# Patient Record
Sex: Male | Born: 1945 | Race: White | Hispanic: No | Marital: Married | Smoking: Never smoker
Health system: Southern US, Community
[De-identification: ages and names within clinical notes are randomized; demographics above are authoritative.]

## PROBLEM LIST (undated history)

## (undated) DIAGNOSIS — N472 Paraphimosis: Secondary | ICD-10-CM

## (undated) DIAGNOSIS — D649 Anemia, unspecified: Secondary | ICD-10-CM

## (undated) DIAGNOSIS — Z923 Personal history of irradiation: Secondary | ICD-10-CM

## (undated) DIAGNOSIS — D696 Thrombocytopenia, unspecified: Secondary | ICD-10-CM

## (undated) DIAGNOSIS — F411 Generalized anxiety disorder: Secondary | ICD-10-CM

## (undated) DIAGNOSIS — F419 Anxiety disorder, unspecified: Secondary | ICD-10-CM

## (undated) DIAGNOSIS — N400 Enlarged prostate without lower urinary tract symptoms: Secondary | ICD-10-CM

## (undated) DIAGNOSIS — B029 Zoster without complications: Secondary | ICD-10-CM

## (undated) DIAGNOSIS — C61 Malignant neoplasm of prostate: Secondary | ICD-10-CM

## (undated) DIAGNOSIS — I10 Essential (primary) hypertension: Secondary | ICD-10-CM

## (undated) DIAGNOSIS — Z8719 Personal history of other diseases of the digestive system: Secondary | ICD-10-CM

## (undated) HISTORY — DX: Benign prostatic hyperplasia without lower urinary tract symptoms: N40.0

## (undated) HISTORY — DX: Malignant neoplasm of prostate: C61

## (undated) HISTORY — DX: Essential (primary) hypertension: I10

## (undated) HISTORY — DX: Anemia, unspecified: D64.9

## (undated) HISTORY — DX: Generalized anxiety disorder: F41.1

## (undated) HISTORY — DX: Personal history of other diseases of the digestive system: Z87.19

## (undated) HISTORY — DX: Zoster without complications: B02.9

---

## 1966-04-22 HISTORY — PX: OTHER SURGICAL HISTORY: SHX169

## 1976-04-22 HISTORY — PX: VASECTOMY: SHX75

## 1999-04-04 ENCOUNTER — Ambulatory Visit (HOSPITAL_COMMUNITY): Admission: RE | Admit: 1999-04-04 | Discharge: 1999-04-04 | Payer: Self-pay | Admitting: *Deleted

## 2008-04-22 DIAGNOSIS — Z8719 Personal history of other diseases of the digestive system: Secondary | ICD-10-CM

## 2008-04-22 HISTORY — DX: Personal history of other diseases of the digestive system: Z87.19

## 2009-01-28 ENCOUNTER — Encounter: Payer: Self-pay | Admitting: Internal Medicine

## 2009-01-28 ENCOUNTER — Inpatient Hospital Stay (HOSPITAL_COMMUNITY): Admission: EM | Admit: 2009-01-28 | Discharge: 2009-01-31 | Payer: Self-pay | Admitting: Emergency Medicine

## 2009-01-28 LAB — CONVERTED CEMR LAB
ALT: 15 units/L
AST: 42 units/L
Alkaline Phosphatase: 1880 units/L
Glucose, Bld: 115 mg/dL
Hgb A1c MFr Bld: 6 %
Platelets: 101 10*3/uL
Sodium: 135 meq/L
WBC: 10.3 10*3/uL

## 2009-02-01 ENCOUNTER — Encounter: Payer: Self-pay | Admitting: Internal Medicine

## 2009-02-02 ENCOUNTER — Encounter: Payer: Self-pay | Admitting: Internal Medicine

## 2009-02-03 ENCOUNTER — Encounter: Payer: Self-pay | Admitting: Internal Medicine

## 2009-02-22 ENCOUNTER — Encounter: Payer: Self-pay | Admitting: Internal Medicine

## 2009-02-22 DIAGNOSIS — D649 Anemia, unspecified: Secondary | ICD-10-CM | POA: Insufficient documentation

## 2009-02-23 ENCOUNTER — Ambulatory Visit: Payer: Self-pay | Admitting: Internal Medicine

## 2009-02-23 DIAGNOSIS — C61 Malignant neoplasm of prostate: Secondary | ICD-10-CM | POA: Insufficient documentation

## 2009-02-23 DIAGNOSIS — Z87448 Personal history of other diseases of urinary system: Secondary | ICD-10-CM | POA: Insufficient documentation

## 2009-02-23 DIAGNOSIS — Z9189 Other specified personal risk factors, not elsewhere classified: Secondary | ICD-10-CM | POA: Insufficient documentation

## 2009-02-23 DIAGNOSIS — Z8719 Personal history of other diseases of the digestive system: Secondary | ICD-10-CM | POA: Insufficient documentation

## 2009-02-23 DIAGNOSIS — M5380 Other specified dorsopathies, site unspecified: Secondary | ICD-10-CM | POA: Insufficient documentation

## 2009-02-23 DIAGNOSIS — L89309 Pressure ulcer of unspecified buttock, unspecified stage: Secondary | ICD-10-CM | POA: Insufficient documentation

## 2009-02-24 DIAGNOSIS — R627 Adult failure to thrive: Secondary | ICD-10-CM

## 2009-03-03 ENCOUNTER — Telehealth: Payer: Self-pay | Admitting: Internal Medicine

## 2009-03-10 ENCOUNTER — Encounter: Payer: Self-pay | Admitting: Internal Medicine

## 2009-03-28 ENCOUNTER — Encounter: Payer: Self-pay | Admitting: Internal Medicine

## 2009-04-11 ENCOUNTER — Encounter: Payer: Self-pay | Admitting: Internal Medicine

## 2009-04-13 ENCOUNTER — Telehealth: Payer: Self-pay | Admitting: Internal Medicine

## 2009-04-25 ENCOUNTER — Ambulatory Visit: Payer: Self-pay | Admitting: Internal Medicine

## 2009-04-25 DIAGNOSIS — I10 Essential (primary) hypertension: Secondary | ICD-10-CM | POA: Insufficient documentation

## 2009-04-25 DIAGNOSIS — F411 Generalized anxiety disorder: Secondary | ICD-10-CM | POA: Insufficient documentation

## 2009-05-05 ENCOUNTER — Telehealth: Payer: Self-pay | Admitting: Internal Medicine

## 2009-05-16 ENCOUNTER — Encounter: Payer: Self-pay | Admitting: Internal Medicine

## 2009-06-15 ENCOUNTER — Encounter: Payer: Self-pay | Admitting: Internal Medicine

## 2009-06-20 DIAGNOSIS — B029 Zoster without complications: Secondary | ICD-10-CM

## 2009-06-20 HISTORY — DX: Zoster without complications: B02.9

## 2009-07-25 ENCOUNTER — Ambulatory Visit: Payer: Self-pay | Admitting: Internal Medicine

## 2009-07-25 DIAGNOSIS — B029 Zoster without complications: Secondary | ICD-10-CM | POA: Insufficient documentation

## 2009-07-26 ENCOUNTER — Ambulatory Visit: Payer: Self-pay | Admitting: Internal Medicine

## 2009-08-15 ENCOUNTER — Encounter: Payer: Self-pay | Admitting: Internal Medicine

## 2009-10-18 ENCOUNTER — Encounter: Payer: Self-pay | Admitting: Internal Medicine

## 2009-10-25 ENCOUNTER — Ambulatory Visit: Payer: Self-pay | Admitting: Internal Medicine

## 2009-11-28 ENCOUNTER — Ambulatory Visit: Payer: Self-pay | Admitting: Internal Medicine

## 2009-11-28 LAB — CONVERTED CEMR LAB
Bilirubin Urine: NEGATIVE
Blood in Urine, dipstick: NEGATIVE
Glucose, Urine, Semiquant: NEGATIVE
Ketones, urine, test strip: NEGATIVE
Specific Gravity, Urine: <1.005
Urobilinogen, UA: 0.2
WBC Urine, dipstick: NEGATIVE
pH: 5

## 2010-01-31 ENCOUNTER — Ambulatory Visit: Payer: Self-pay | Admitting: Internal Medicine

## 2010-01-31 LAB — CONVERTED CEMR LAB
BUN: 16 mg/dL (ref 6–23)
Chloride: 105 meq/L (ref 96–112)
Cholesterol: 183 mg/dL (ref 0–200)
HDL: 38.9 mg/dL — ABNORMAL LOW (ref >39.00)
LDL Cholesterol: 112 mg/dL — ABNORMAL HIGH (ref 0–99)
Sodium: 141 meq/L (ref 135–145)
Total CHOL/HDL Ratio: 5
Triglycerides: 163 mg/dL — ABNORMAL HIGH (ref 0.0–149.0)

## 2010-02-21 ENCOUNTER — Encounter: Payer: Self-pay | Admitting: Internal Medicine

## 2010-04-25 ENCOUNTER — Encounter: Payer: Self-pay | Admitting: Internal Medicine

## 2010-05-22 NOTE — Assessment & Plan Note (Signed)
Summary: LOW BLOOD PRESSURE/CD   Vital Signs:  Patient profile:   65 year old male Height:      65 inches (165.10 cm) Weight:      173.8 pounds (79 kg) BMI:     29.03 O2 Sat:      94 % on Room air Temp:     97.6 degrees F (36.44 degrees C) oral Pulse rate:   70 / minute BP sitting:   128 / 88  (left arm) Cuff size:   regular  Vitals Entered By: Orlan Leavens RMA (November 28, 2009 10:05 AM)  O2 Flow:  Room air CC: elevated Bp Is Patient Diabetic? No Pain Assessment Patient in pain? no      Comments Also pt complaining of UTI sxs   Primary Care Provider:  Newt Lukes MD  CC:  elevated Bp.  History of Present Illness: here for 3 month followup  1) HTN - home BP reviewed: range SBP 120-130s now on amlodipine 10mg  once daily with atenolol- BP readings exac by anxiety and pain - no adv SE related to med tx  - no edema no CP or HA  2) met prostate cancer - reports PSA undetctable - on hormone tx - pain mgmt by uro - walking with walker -  3) anxiety - uses as needed valium, no inc in dose or freq reports compliance with ongoing medical treatment and no changes in medication dose or frequency. denies adverse side effects related to current therapy.   4) concerned about ?UTI -  c/o urinary freq, sm vol voids no dysuria or hematuria symptoms improved in last 5 days with change in flomax dosing   Clinical Review Panels:  Immunizations   Last Tetanus Booster:  Tdap (02/23/2009)   Last Flu Vaccine:  Fluvax 3+ (04/25/2009)   Last Pneumovax:  Pneumovax (04/25/2009)   Last Zoster Vaccine:  Zostavax (07/26/2009)  Diabetes Management   HgBA1C:  6.0 (01/28/2009)   Creatinine:  0.65 (01/28/2009)   Last Flu Vaccine:  Fluvax 3+ (04/25/2009)   Last Pneumovax:  Pneumovax (04/25/2009)  CBC   WBC:  10.3 (01/28/2009)   Hgb:  9.8 (01/28/2009)   Platelets:  101 (01/28/2009)   MCV  86.6 (01/28/2009)  Complete Metabolic Panel   Glucose:  115 (01/28/2009)   Sodium:   135 (01/28/2009)   Potassium:  4.0 (01/28/2009)   Chloride:  102 (01/28/2009)   CO2:  27 (01/28/2009)   BUN:  21 (01/28/2009)   Creatinine:  0.65 (01/28/2009)   Albumin:  2.9 (01/28/2009)   Calcium:  8.8 (01/28/2009)   Alk Phos:  1880 (01/28/2009)   SGPT (ALT):  15 (01/28/2009)   SGOT (AST):  42 (01/28/2009)   Current Medications (verified): 1)  Flomax 0.4 Mg Caps (Tamsulosin Hcl) .... Take 1 By Mouth Every Other Day 2)  Colace 100 Mg Caps (Docusate Sodium) .... Take 1 By Mouth Once Daily 3)  Diazepam 5 Mg Tabs (Diazepam) .... 1/2-1 Tab By Mouth Every 8 Hours As Needed For Anxiety/muscle Spasm 4)  Promethazine Hcl 12.5 Mg Tabs (Promethazine Hcl) .Marland Kitchen.. 1 Q 6 Hrs As Needed For Nausea 5)  Amlodipine Besylate 10 Mg Tabs (Amlodipine Besylate) .Marland Kitchen.. 1 By Mouth Once Daily 6)  Oxycontin 20 Mg Xr12h-Tab (Oxycodone Hcl) .Marland Kitchen.. 1 By Mouth Two Times A Day - Per Uro 7)  Oxycodone Hcl 10 Mg Tabs (Oxycodone Hcl) .... Use As Needed For Break-Through Pain 8)  Casodex 50 Mg Tabs (Bicalutamide) .... Takw 1 By Mouth Once  Daily 9)  Atenolol 50 Mg Tabs (Atenolol) .Marland Kitchen.. 1 By Mouth Once Daily 10)  Triamcinolone Acetonide 0.1 % Crea (Triamcinolone Acetonide) .... Apply To Itch Three Times A Day As Needed  Allergies (verified): No Known Drug Allergies  Past History:  Past Medical History: metastatic prostate cancer, involving T-L spine Anemia-NOS  Diverticulitis, hx of shingles - 06/2009 BPH   MD roster:   Darlen Round - Kimbrough  Review of Systems  The patient denies vision loss, chest pain, syncope, headaches, and abdominal pain.    Physical Exam  General:  debilitated appearing man, holding onto RW even while sitting in chair - wife at side Lungs:  normal respiratory effort, no intercostal retractions or use of accessory muscles; normal breath sounds bilaterally - no crackles and no wheezes.    Heart:  normal rate, regular rhythm, no murmur, and no rub. BLE without edema. Psych:  Oriented X3, memory  intact for recent and remote, normally interactive, good eye contact, not anxious appearing, not depressed appearing, and not agitated.      Impression & Recommendations:  Problem # 1:  HYPERTENSION (ICD-401.9)  His updated medication list for this problem includes:    Amlodipine Besylate 10 Mg Tabs (Amlodipine besylate) .Marland Kitchen... 1 by mouth once daily    Atenolol 50 Mg Tabs (Atenolol) .Marland Kitchen... 1 by mouth once daily  improved - cont same check labs next OV  BP today: 128/88 Prior BP: 130/84 (10/25/2009)  Labs Reviewed: K+: 4.0 (01/28/2009) Creat: : 0.65 (01/28/2009)     Problem # 2:  ANXIETY (ICD-300.00)  His updated medication list for this problem includes:    Diazepam 5 Mg Tabs (Diazepam) .Marland Kitchen... 1/2-1 tab by mouth every 8 hours as needed for anxiety/muscle spasm  exac by dx, pain and fear of falling (weak due to mets in spine) cont current tx as ongoing  Problem # 3:  ADENOCARCINOMA, PROSTATE, METASTATIC (ICD-185)  last note from uro reviewed -  reports dec PSA follows with uro Kimbrough for treatment - Lupron every 4 months ongoing s/p XRT to help with pain - narcs as per uro on Valium for dual effect on anxiety and muscle relax pain handicap tag application signed by me 04/2009 due to disabilty/weakness and need for RW  Time spent with patient/wife 25 minutes, more than 50% of this time was spent counseling patient on medications and overall improvement in health  Problem # 4:  UTI'S, HX OF (ICD-V13.00) Udip neg - reassurance provided - cont flomax dosing as ongoing Orders: UA Dipstick w/o Micro (manual) (52841)  Complete Medication List: 1)  Flomax 0.4 Mg Caps (Tamsulosin hcl) .... Take 1 by mouth every other day 2)  Colace 100 Mg Caps (Docusate sodium) .... Take 1 by mouth once daily 3)  Diazepam 5 Mg Tabs (Diazepam) .... 1/2-1 tab by mouth every 8 hours as needed for anxiety/muscle spasm 4)  Promethazine Hcl 12.5 Mg Tabs (Promethazine hcl) .Marland Kitchen.. 1 q 6 hrs as needed for  nausea 5)  Amlodipine Besylate 10 Mg Tabs (Amlodipine besylate) .Marland Kitchen.. 1 by mouth once daily 6)  Oxycontin 20 Mg Xr12h-tab (Oxycodone hcl) .Marland Kitchen.. 1 by mouth two times a day - per uro 7)  Oxycodone Hcl 10 Mg Tabs (Oxycodone hcl) .... Use as needed for break-through pain 8)  Casodex 50 Mg Tabs (Bicalutamide) .... Takw 1 by mouth once daily 9)  Atenolol 50 Mg Tabs (Atenolol) .Marland Kitchen.. 1 by mouth once daily 10)  Triamcinolone Acetonide 0.1 % Crea (Triamcinolone acetonide) .... Apply to itch  three times a day as needed  Patient Instructions: 1)  it was good to see you today. 2)  meds reviewed and no medications changes recommended today- 3)  Please keep follow-up appointment in  October for fasting blood work (cholesterol, lytes/Cr) and to monitor blood pressure and medications, call sooner if problems.  4)  Continue to check your Blood Pressure regularly. If it is above: 140/90 you should make an appointment.  Laboratory Results   Urine Tests    Routine Urinalysis   Color: yellow Appearance: Clear Glucose: negative   (Normal Range: Negative) Bilirubin: negative   (Normal Range: Negative) Ketone: negative   (Normal Range: Negative) Spec. Gravity: <1.005   (Normal Range: 1.003-1.035) Blood: negative   (Normal Range: Negative) pH: 5.0   (Normal Range: 5.0-8.0) Protein: negative   (Normal Range: Negative) Urobilinogen: 0.2   (Normal Range: 0-1) Nitrite: negative   (Normal Range: Negative) Leukocyte Esterace: negative   (Normal Range: Negative)

## 2010-05-22 NOTE — Letter (Signed)
Summary: [facility]  [facility]   Imported By: Lester Hazel Crest 08/21/2009 10:57:08  _____________________________________________________________________  External Attachment:    Type:   Image     Comment:   External Document

## 2010-05-22 NOTE — Letter (Signed)
Summary: [facility]  [facility]   Imported By: Sherian Rein 05/22/2009 07:42:34  _____________________________________________________________________  External Attachment:    Type:   Image     Comment:   External Document

## 2010-05-22 NOTE — Assessment & Plan Note (Signed)
Summary: 2-3 MO ROV /NWS  #   Vital Signs:  Patient profile:   65 year old male Height:      65 inches (165.10 cm) Weight:      142.4 pounds (64.73 kg) O2 Sat:      96 % on Room air Temp:     98.8 degrees F (37.11 degrees C) oral Pulse rate:   98 / minute BP sitting:   152 / 101  (left arm) Cuff size:   regular  Vitals Entered By: Orlan Leavens (April 25, 2009 8:33 AM)  O2 Flow:  Room air CC: 2 month f/u Is Patient Diabetic? No Pain Assessment Patient in pain? no        Primary Care Provider:  Newt Lukes MD  CC:  2 month f/u.  History of Present Illness: new HTN has checked home BP: range SBP 160-190 called here, [facility]- (see phone note, reviewed) started on amlodipine 5mg  once daily - BP doing better now exac by anxiety and pain - no adv SE related to med tx  no CP or HA  met prostate cancer - reports PSA going down - on hormone tx - pain mgmt by uro - walking with walker - requests handicap tag application  anxiety - uses as needed valium, no inc in dose or freq reports compliance with ongoing medical treatment and no changes in medication dose or frequency. denies adverse side effects related to current therapy.   Current Medications (verified): 1)  Flomax 0.4 Mg Caps (Tamsulosin Hcl) .... Take 1 By Mouth Qd 2)  Colace 100 Mg Caps (Docusate Sodium) .... Take 1 By Mouth Qd 3)  Diazepam 5 Mg Tabs (Diazepam) .... 1/2-1 Tab By Mouth Every 8 Hours As Needed For Anxiety/muscle Spasm 4)  Promethazine Hcl 12.5 Mg Tabs (Promethazine Hcl) .Marland Kitchen.. 1 Q 6 Hrs As Needed For Nausea 5)  Amlodipine Besylate 5 Mg Tabs (Amlodipine Besylate) .... Take 1 Po Qd  Allergies (verified): No Known Drug Allergies  Past History:  Past Medical History: Last updated: 02/23/2009 metastatic prostate cancer, involving T-L spine - kimbrough Anemia-NOS Diverticulitis, hx of  Review of Systems  The patient denies fever, chest pain, syncope,  peripheral edema, and headaches.    Physical Exam  General:  debilitated appearing man, holding onto RW even while sitting in chair - wife at side Lungs:  normal respiratory effort, no intercostal retractions or use of accessory muscles; normal breath sounds bilaterally - no crackles and no wheezes.    Heart:  normal rate, regular rhythm, no murmur, and no rub. BLE without edema.   Impression & Recommendations:  Problem # 1:  HYPERTENSION (ICD-401.9) likely exac by diet (inc salt per his report), pain and anxiety - improving with new med but suspect will need inc dose - (home BP log reviewed today with pt) pt to call before next refill due if SBP not <140 -- will inc amlodipine to 10mg  once daily  His updated medication list for this problem includes:    Amlodipine Besylate 5 Mg Tabs (Amlodipine besylate) .Marland Kitchen... Take 1 po qd  BP today: 152/101 Prior BP: 122/62 (02/23/2009)  Labs Reviewed: K+: 4.0 (01/28/2009) Creat: : 0.65 (01/28/2009)     Problem # 2:  ADENOCARCINOMA, PROSTATE, METASTATIC (ICD-185) reports dec PSA follows with uro Kimbrough for treatment - ongoing XRT to help with pain - narcs as per uro on Valium for dual effect on anxiety and muscle relax  pain handicap tag application signed by me today due to disabilty/weakness and need for RW  Problem # 3:  ANXIETY (ICD-300.00) exac by dx, pain and fear of falling (weak due to mets in spine) cont current tx as ongoing His updated medication list for this problem includes:    Diazepam 5 Mg Tabs (Diazepam) .Marland Kitchen... 1/2-1 tab by mouth every 8 hours as needed for anxiety/muscle spasm  Complete Medication List: 1)  Flomax 0.4 Mg Caps (Tamsulosin hcl) .... Take 1 by mouth qd 2)  Colace 100 Mg Caps (Docusate sodium) .... Take 1 by mouth qd 3)  Diazepam 5 Mg Tabs (Diazepam) .... 1/2-1 tab by mouth every 8 hours as needed for anxiety/muscle spasm 4)  Promethazine Hcl 12.5 Mg Tabs (Promethazine hcl) .Marland Kitchen.. 1 q 6 hrs as needed for  nausea 5)  Amlodipine Besylate 5 Mg Tabs (Amlodipine besylate) .... Take 1 po qd 6)  Oxycontin 20 Mg Xr12h-tab (Oxycodone hcl) .Marland Kitchen.. 1 by mouth two times a day - per uro  Other Orders: Admin 1st Vaccine (02409) Flu Vaccine 65yrs + (73532) Pneumococcal Vaccine (99242) Admin of Any Addtl Vaccine (68341)  Patient Instructions: 1)  it was good to see you today.  2)  if SBP>140/90 when due for amlodipine refill, call and we will send in new rx for 10mg  dose once daily   - otherwise, if well controlled BP, we will continue current 5mg  dose when due for refill 3)  you have had your flu and pneumonia vacinations today 4)  handicap form application signed today 5)  Please schedule a follow-up appointment in 3 months, sooner if problems.  Flu Vaccine Consent Questions     Do you have a history of severe allergic reactions to this vaccine? no    Any prior history of allergic reactions to egg and/or gelatin? no    Do you have a sensitivity to the preservative Thimersol? no    Do you have a past history of Guillan-Barre Syndrome? no    Do you currently have an acute febrile illness? no    Have you ever had a severe reaction to latex? no    Vaccine information given and explained to patient? yes    Are you currently pregnant? no    Lot Number:[id number]   Exp Date:10/19/2009   Site Given  Right Deltoid IM 4)  handicap form application signed today 5)  Please schedule a follow-up appointment in 3 months, sooner if problems.    Immunizations Administered:  Pneumonia Vaccine:    Vaccine Type: Pneumovax    Site: left deltoid    Mfr: Merck    Dose: 0.5 ml    Route: IM    Given by: Orlan Leavens    Exp. Date: 05/19/2010    Lot #: [id number]    VIS given: 04/25/09  .lbflu

## 2010-05-22 NOTE — Letter (Signed)
Summary: [facility]  [facility]   Imported By: Sherian Rein 06/21/2009 07:38:10  _____________________________________________________________________  External Attachment:    Type:   Image     Comment:   External Document

## 2010-05-22 NOTE — Progress Notes (Signed)
  Phone Note Call from Patient Call back at Home Phone [phone] Call back at or [phone] cell   Caller: Patient Call For: Dr Felicity Coyer Summary of Call: Pt called and states he needs bp medicine called to [facility] [address] [phone] phone) - Amlodipine Besylate 10 mg instead of 5mg  pt states Dr Felicity Coyer told pt to call when he needed a refill and she would send, he has a couple pills left. Please let pt know. Initial call taken by: Verdell Face,  May 05, 2009 10:25 AM  Follow-up for Phone Call        154/102 on right 149/95 on left Last ov states if bp is not under 140 Amlodipine Besylate would be increased to 10. OK to refill? Follow-up by: Josph Macho CMA,  May 05, 2009 10:54 AM  Additional Follow-up for Phone Call Additional follow up Details #1::        yes - i have done so - sent e-rx fpr 10mg  tabs- please let pt know same - thanks Additional Follow-up by: Newt Lukes MD,  May 05, 2009 12:50 PM    Additional Follow-up for Phone Call Additional follow up Details #2::    Informed pt Follow-up by: Josph Macho CMA,  May 05, 2009 2:05 PM  New/Updated Medications: AMLODIPINE BESYLATE 10 MG TABS (AMLODIPINE BESYLATE) 1 by mouth once daily Prescriptions: AMLODIPINE BESYLATE 10 MG TABS (AMLODIPINE BESYLATE) 1 by mouth once daily  #30 x 3   Entered and Authorized by:   Newt Lukes MD   Signed by:   Newt Lukes MD on 05/05/2009   Method used:   [facility]ically to        [facility]  [address] [id number] ([facility])       [address]       [place], [place]  [address]       Ph: [phone] or [phone]       Fax: [phone]   RxID:   [id number]

## 2010-05-22 NOTE — Letter (Signed)
Summary: [facility]  [facility]   Imported By: Lennie Odor 10/25/2009 16:53:25  _____________________________________________________________________  External Attachment:    Type:   Image     Comment:   External Document

## 2010-05-22 NOTE — Assessment & Plan Note (Signed)
Summary: 3 MO ROV /NWS  #   Vital Signs:  Patient profile:   65 year old male Height:      65 inches (165.10 cm) Weight:      177.2 pounds (80.55 kg) O2 Sat:      97 % on Room air Temp:     98.7 degrees F (37.06 degrees C) oral Pulse rate:   71 / minute BP sitting:   130 / 90  (left arm) Cuff size:   regular  Vitals Entered By: Orlan Leavens RMA (January 31, 2010 10:31 AM)  O2 Flow:  Room air CC: 3 month follow-up Is Patient Diabetic? No Pain Assessment Patient in pain? no      Comments want flu shot   Primary Care Provider:  Newt Lukes MD  CC:  3 month follow-up.  History of Present Illness: here for 3 month followup  1) HTN - home BP reviewed: range SBP 120-130s now on amlodipine 10mg  once daily with atenolol- BP readings exac by anxiety and pain - no adv SE related to med tx  - no edema no CP or HA  2) met prostate cancer - reports PSA undetctable - on hormone tx - pain mgmt by uro - controlled walking with walker only as needed (long exertion out of house)  3) anxiety - uses as needed valium, no inc in dose or freq reports compliance with ongoing medical treatment and no changes in medication dose or frequency. denies adverse side effects related to current therapy.    Clinical Review Panels:  CBC   WBC:  10.3 (01/28/2009)   Hgb:  9.8 (01/28/2009)   Platelets:  101 (01/28/2009)   MCV  86.6 (01/28/2009)  Complete Metabolic Panel   Glucose:  115 (01/28/2009)   Sodium:  135 (01/28/2009)   Potassium:  4.0 (01/28/2009)   Chloride:  102 (01/28/2009)   CO2:  27 (01/28/2009)   BUN:  21 (01/28/2009)   Creatinine:  0.65 (01/28/2009)   Albumin:  2.9 (01/28/2009)   Calcium:  8.8 (01/28/2009)   Alk Phos:  1880 (01/28/2009)   SGPT (ALT):  15 (01/28/2009)   SGOT (AST):  42 (01/28/2009)   Current Medications (verified): 1)  Flomax 0.4 Mg Caps (Tamsulosin Hcl) .... Take 1 By Mouth Every Other Day 2)  Colace 100 Mg Caps (Docusate Sodium) .... Take 1 By  Mouth Once Daily 3)  Amlodipine Besylate 10 Mg Tabs (Amlodipine Besylate) .Marland Kitchen.. 1 By Mouth Once Daily 4)  Oxycodone Hcl 10 Mg Tabs (Oxycodone Hcl) .... Use As Needed For Break-Through Pain 5)  Casodex 50 Mg Tabs (Bicalutamide) .... Takw 1 By Mouth Once Daily 6)  Atenolol 50 Mg Tabs (Atenolol) .Marland Kitchen.. 1 By Mouth Once Daily 7)  Oxycodone-Acetaminophen 5-325 Mg Tabs (Oxycodone-Acetaminophen) .... Take 2 Every 4 Hours As Needed For Pain  Allergies (verified): No Known Drug Allergies  Past History:  Past Medical History: metastatic prostate cancer, involving T-L spine Anemia-NOS  Diverticulitis, hx of shingles - 06/2009 BPH     MD roster:   Joya Martyr  Review of Systems  The patient denies fever, weight loss, chest pain, syncope, and abdominal pain.    Physical Exam  General:  debilitated appearing man, holding onto RW even while sitting in chair - wife at side Lungs:  normal respiratory effort, no intercostal retractions or use of accessory muscles; normal breath sounds bilaterally - no crackles and no wheezes.    Heart:  normal rate, regular rhythm, no murmur, and no  rub. BLE without edema.   Impression & Recommendations:  Problem # 1:  HYPERTENSION (ICD-401.9)  His updated medication list for this problem includes:    Amlodipine Besylate 10 Mg Tabs (Amlodipine besylate) .Marland Kitchen... 1 by mouth once daily    Atenolol 50 Mg Tabs (Atenolol) .Marland Kitchen... 1 by mouth once daily  Orders: TLB-Lipid Panel (80061-LIPID) TLB-BMP (Basic Metabolic Panel-BMET) (80048-METABOL)  BP today: 130/90 Prior BP: 128/88 (11/28/2009)  Labs Reviewed: K+: 4.0 (01/28/2009) Creat: : 0.65 (01/28/2009)     Problem # 2:  ADENOCARCINOMA, PROSTATE, METASTATIC (ICD-185)  last note from uro reviewed -  reports dec PSA follows with uro Kimbrough for treatment - Lupron every 4 months ongoing s/p XRT to help with pain - narcs as per uro on Valium for dual effect on anxiety and muscle relax pain handicap tag  application signed by me 04/2009 due to disabilty/weakness and need for RW  Time spent with patient/wife 25 minutes, more than 50% of this time was spent counseling patient on medications and overall improvement in health  Problem # 3:  ANXIETY (ICD-300.00)  The following medications were removed from the medication list:    Diazepam 5 Mg Tabs (Diazepam) .Marland Kitchen... 1/2-1 tab by mouth every 8 hours as needed for anxiety/muscle spasm  exac by dx, pain and fear of falling (weak due to mets in spine) cont current tx as ongoing  Complete Medication List: 1)  Flomax 0.4 Mg Caps (Tamsulosin hcl) .... Take 1 by mouth every other day 2)  Colace 100 Mg Caps (Docusate sodium) .... Take 1 by mouth once daily 3)  Amlodipine Besylate 10 Mg Tabs (Amlodipine besylate) .Marland Kitchen.. 1 by mouth once daily 4)  Oxycodone Hcl 10 Mg Tabs (Oxycodone hcl) .... Use as needed for break-through pain 5)  Casodex 50 Mg Tabs (Bicalutamide) .... Takw 1 by mouth once daily 6)  Atenolol 50 Mg Tabs (Atenolol) .Marland Kitchen.. 1 by mouth once daily 7)  Oxycodone-acetaminophen 5-325 Mg Tabs (Oxycodone-acetaminophen) .... Take 2 every 4 hours as needed for pain  Other Orders: Admin 1st Vaccine (96295) Flu Vaccine 80yrs + (28413) Flu Vaccine Consent Questions     Do you have a history of severe allergic reactions to this vaccine? no    Any prior history of allergic reactions to egg and/or gelatin? no    Do you have a sensitivity to the preservative Thimersol? no    Do you have a past history of Guillan-Barre Syndrome? no    Do you currently have an acute febrile illness? no    Have you ever had a severe reaction to latex? no    Vaccine information given and explained to patient? yes    Are you currently pregnant? no    Lot Number:[id number]   Exp Date:10/20/2010   Site Given  Left Deltoid IM Flu Vaccine 58yrs + (24401)  Patient Instructions: 1)  it was good to see you today. 2)  test(s) ordered today - your results will be posted on the phone  tree for review in 48-72 hours from the time of test completion; call [phone] and enter your 9 digit MRN (listed above on this page, just below your name); if any changes need to be made or there are abnormal results, you will be contacted directly.  3)  Please schedule a follow-up appointment in 6 months, sooner if problems.   Marland Kitchenlbflu

## 2010-05-22 NOTE — Assessment & Plan Note (Signed)
Summary: 3 mos f/u / # / cd   Vital Signs:  Patient profile:   65 year old male Height:      65 inches (165.10 cm) Weight:      173.4 pounds (78.82 kg) O2 Sat:      94 % on Room air Temp:     98.7 degrees F (37.06 degrees C) oral Pulse rate:   69 / minute BP sitting:   130 / 84  (left arm) Cuff size:   regular  Vitals Entered By: Orlan Leavens (October 25, 2009 8:33 AM)  O2 Flow:  Room air CC: 3 month follow-up Is Patient Diabetic? No Pain Assessment Patient in pain? no        Primary Care Provider:  Newt Lukes MD  CC:  3 month follow-up.  History of Present Illness: here for 3 month followup  1) HTN - home BP: range SBP 120-130s now on amlodipine 10mg  once daily with atenolol- BP readings exac by anxiety and pain - no adv SE related to med tx  - no edema no CP or HA  2) met prostate cancer - reports PSA undetctable - on hormone tx - pain mgmt by uro - walking with walker - requests handicap tag application  3) anxiety - uses as needed valium, no inc in dose or freq reports compliance with ongoing medical treatment and no changes in medication dose or frequency. denies adverse side effects related to current therapy.    Clinical Review Panels:  Immunizations   Last Tetanus Booster:  Tdap (02/23/2009)   Last Flu Vaccine:  Fluvax 3+ (04/25/2009)   Last Pneumovax:  Pneumovax (04/25/2009)   Last Zoster Vaccine:  Zostavax (07/26/2009)  CBC   WBC:  10.3 (01/28/2009)   Hgb:  9.8 (01/28/2009)   Platelets:  101 (01/28/2009)   MCV  86.6 (01/28/2009)  Complete Metabolic Panel   Glucose:  115 (01/28/2009)   Sodium:  135 (01/28/2009)   Potassium:  4.0 (01/28/2009)   Chloride:  102 (01/28/2009)   CO2:  27 (01/28/2009)   BUN:  21 (01/28/2009)   Creatinine:  0.65 (01/28/2009)   Albumin:  2.9 (01/28/2009)   Calcium:  8.8 (01/28/2009)   Alk Phos:  1880 (01/28/2009)   SGPT (ALT):  15 (01/28/2009)   SGOT (AST):  42 (01/28/2009)   Current Medications  (verified): 1)  Flomax 0.4 Mg Caps (Tamsulosin Hcl) .... Take 1 By Mouth Every Other Day 2)  Colace 100 Mg Caps (Docusate Sodium) .... Take 1 By Mouth Once Daily 3)  Diazepam 5 Mg Tabs (Diazepam) .... 1/2-1 Tab By Mouth Every 8 Hours As Needed For Anxiety/muscle Spasm 4)  Promethazine Hcl 12.5 Mg Tabs (Promethazine Hcl) .Marland Kitchen.. 1 Q 6 Hrs As Needed For Nausea 5)  Amlodipine Besylate 10 Mg Tabs (Amlodipine Besylate) .Marland Kitchen.. 1 By Mouth Once Daily 6)  Oxycontin 20 Mg Xr12h-Tab (Oxycodone Hcl) .Marland Kitchen.. 1 By Mouth Two Times A Day - Per Uro 7)  Oxycodone Hcl 10 Mg Tabs (Oxycodone Hcl) .... Use As Needed For Break-Through Pain 8)  Casodex 50 Mg Tabs (Bicalutamide) .... Takw 1 By Mouth Once Daily 9)  Atenolol 50 Mg Tabs (Atenolol) .Marland Kitchen.. 1 By Mouth Once Daily 10)  Triamcinolone Acetonide 0.1 % Crea (Triamcinolone Acetonide) .... Apply To Itch Three Times A Day As Needed  Allergies (verified): No Known Drug Allergies  Past History:  Past Medical History: metastatic prostate cancer, involving T-L spine Anemia-NOS Diverticulitis, hx of shingles - 06/2009 BPH   MD  roster: Darlen Round - Kimbrough  Review of Systems  The patient denies anorexia, fever, weight loss, chest pain, and headaches.    Physical Exam  General:  debilitated appearing man, holding onto RW even while sitting in chair - wife at side Lungs:  normal respiratory effort, no intercostal retractions or use of accessory muscles; normal breath sounds bilaterally - no crackles and no wheezes.    Heart:  normal rate, regular rhythm, no murmur, and no rub. BLE without edema. Psych:  Oriented X3, memory intact for recent and remote, normally interactive, good eye contact, not anxious appearing, not depressed appearing, and not agitated.      Impression & Recommendations:  Problem # 1:  HYPERTENSION (ICD-401.9) Assessment Improved improved - cont same check labs next OV 3 mo His updated medication list for this problem includes:    Amlodipine  Besylate 10 Mg Tabs (Amlodipine besylate) .Marland Kitchen... 1 by mouth once daily    Atenolol 50 Mg Tabs (Atenolol) .Marland Kitchen... 1 by mouth once daily  BP today: 130/84 Prior BP: 150/92 (07/25/2009)  Labs Reviewed: K+: 4.0 (01/28/2009) Creat: : 0.65 (01/28/2009)     Problem # 2:  ANXIETY (ICD-300.00)  exac by dx, pain and fear of falling (weak due to mets in spine) cont current tx as ongoing His updated medication list for this problem includes:    Diazepam 5 Mg Tabs (Diazepam) .Marland Kitchen... 1/2-1 tab by mouth every 8 hours as needed for anxiety/muscle spasm  Problem # 3:  ADENOCARCINOMA, PROSTATE, METASTATIC (ICD-185)  last note from uro reviewed -  reports dec PSA follows with uro Kimbrough for treatment - Lupron every 4 months ongoing s/p XRT to help with pain - narcs as per uro on Valium for dual effect on anxiety and muscle relax pain handicap tag application signed by me 04/2009 due to disabilty/weakness and need for RW  Time spent with patient/wife 25 minutes, more than 50% of this time was spent counseling patient on medications and overall improvement in health  Complete Medication List: 1)  Flomax 0.4 Mg Caps (Tamsulosin hcl) .... Take 1 by mouth every other day 2)  Colace 100 Mg Caps (Docusate sodium) .... Take 1 by mouth once daily 3)  Diazepam 5 Mg Tabs (Diazepam) .... 1/2-1 tab by mouth every 8 hours as needed for anxiety/muscle spasm 4)  Promethazine Hcl 12.5 Mg Tabs (Promethazine hcl) .Marland Kitchen.. 1 q 6 hrs as needed for nausea 5)  Amlodipine Besylate 10 Mg Tabs (Amlodipine besylate) .Marland Kitchen.. 1 by mouth once daily 6)  Oxycontin 20 Mg Xr12h-tab (Oxycodone hcl) .Marland Kitchen.. 1 by mouth two times a day - per uro 7)  Oxycodone Hcl 10 Mg Tabs (Oxycodone hcl) .... Use as needed for break-through pain 8)  Casodex 50 Mg Tabs (Bicalutamide) .... Takw 1 by mouth once daily 9)  Atenolol 50 Mg Tabs (Atenolol) .Marland Kitchen.. 1 by mouth once daily 10)  Triamcinolone Acetonide 0.1 % Crea (Triamcinolone acetonide) .... Apply to itch  three times a day as needed  Patient Instructions: 1)  it was good to see you today. 2)  continue atenolol and amlodipne for blood pressure - much improved 3)  Please schedule a follow-up appointment in 3 months to monitor blood pressure and medications, sooner if problems.  4)  Check your Blood Pressure regularly. If it is above: 140/90 you should make an appointment.

## 2010-05-22 NOTE — Assessment & Plan Note (Signed)
Summary: 3 MTH FU   STC   Vital Signs:  Patient profile:   65 year old male Height:      65 inches (165.10 cm) Weight:      160.4 pounds (72.91 kg) O2 Sat:      95 % on Room air Temp:     99.3 degrees F (37.39 degrees C) oral Pulse rate:   91 / minute BP sitting:   150 / 92  (left arm) Cuff size:   regular  Vitals Entered By: Orlan Leavens (July 25, 2009 8:34 AM)  O2 Flow:  Room air CC: 3 MONTH FOLLOW-UP Is Patient Diabetic? No Pain Assessment Patient in pain? no        Primary Care Provider:  Newt Lukes MD  CC:  3 MONTH FOLLOW-UP.  History of Present Illness: here for 3 month followup  1) HTN - home BP: range SBP 150s now on amlodipine 10mg  once daily - BP doing better but not at goal BP readings exac by anxiety and pain - no adv SE related to med tx  - no edema no CP or HA  2) met prostate cancer - reports PSA going down - on hormone tx - pain mgmt by uro - walking with walker - requests handicap tag application  3) anxiety - uses as needed valium, no inc in dose or freq reports compliance with ongoing medical treatment and no changes in medication dose or frequency. denies adverse side effects related to current therapy.   4) episode of shingles affecting LUE 1 months ago - revieved 7 day tx [facility] no residual pain but ?re; poss ib;e vaccine to prevent recurrent episode  Clinical Review Panels:  Prevention   Last Colonoscopy:  Pt states was done in [place] Results : Normal (04/22/2006)  Immunizations   Last Tetanus Booster:  Tdap (02/23/2009)   Last Flu Vaccine:  Fluvax 3+ (04/25/2009)   Last Pneumovax:  Pneumovax (04/25/2009)  CBC   WBC:  10.3 (01/28/2009)   Hgb:  9.8 (01/28/2009)   Platelets:  101 (01/28/2009)   MCV  86.6 (01/28/2009)  Complete Metabolic Panel   Glucose:  115 (01/28/2009)   Sodium:  135 (01/28/2009)   Potassium:  4.0 (01/28/2009)   Chloride:  102 (01/28/2009)   CO2:  27 (01/28/2009)  BUN:  21 (01/28/2009)   Creatinine:  0.65 (01/28/2009)   Albumin:  2.9 (01/28/2009)   Calcium:  8.8 (01/28/2009)   Alk Phos:  1880 (01/28/2009)   SGPT (ALT):  15 (01/28/2009)   SGOT (AST):  42 (01/28/2009)   Current Medications (verified): 1)  Flomax 0.4 Mg Caps (Tamsulosin Hcl) .... Take 1 By Mouth Qd 2)  Colace 100 Mg Caps (Docusate Sodium) .... Take 1 By Mouth Qd 3)  Diazepam 5 Mg Tabs (Diazepam) .... 1/2-1 Tab By Mouth Every 8 Hours As Needed For Anxiety/muscle Spasm 4)  Promethazine Hcl 12.5 Mg Tabs (Promethazine Hcl) .Marland Kitchen.. 1 Q 6 Hrs As Needed For Nausea 5)  Amlodipine Besylate 10 Mg Tabs (Amlodipine Besylate) .Marland Kitchen.. 1 By Mouth Once Daily 6)  Oxycontin 20 Mg Xr12h-Tab (Oxycodone Hcl) .Marland Kitchen.. 1 By Mouth Two Times A Day - Per Uro 7)  Oxycodone Hcl 10 Mg Tabs (Oxycodone Hcl) .... Use As Needed For Break-Through Pain 8)  Casodex 50 Mg Tabs (Bicalutamide) .... Takw 1 By Mouth Qd  Allergies (verified): No Known Drug Allergies  Past History:  Past Medical History: metastatic prostate cancer, involving T-L spine Anemia-NOS Diverticulitis,  hx of shingles - 06/2009  MD rooster: uro - Kimbrough  Review of Systems  The patient denies anorexia, fever, weight loss, abdominal pain, difficulty walking, and depression.    Physical Exam  General:  debilitated appearing man, holding onto RW even while sitting in chair - wife at side Lungs:  normal respiratory effort, no intercostal retractions or use of accessory muscles; normal breath sounds bilaterally - no crackles and no wheezes.    Heart:  normal rate, regular rhythm, no murmur, and no rub. BLE without edema. Skin:  healing/healed shingles lesions along lower LUE - no active lesions or cellulitis Psych:  Oriented X3, memory intact for recent and remote, normally interactive, good eye contact, not anxious appearing, not depressed appearing, and not agitated.      Impression & Recommendations:  Problem # 1:  HYPERTENSION  (ICD-401.9)  still incontolled though improving -  add atenolol to current tx - risk/benefit and poss SE discussed His updated medication list for this problem includes:    Amlodipine Besylate 10 Mg Tabs (Amlodipine besylate) .Marland Kitchen... 1 by mouth once daily    Atenolol 50 Mg Tabs (Atenolol) .Marland Kitchen... 1 by mouth once daily  Orders: Prescription Created Electronically [phone])  BP today: 150/92 Prior BP: 152/101 (04/25/2009)  Labs Reviewed: K+: 4.0 (01/28/2009) Creat: : 0.65 (01/28/2009)     Problem # 2:  ANXIETY (ICD-300.00)  exac by dx, pain and fear of falling (weak due to mets in spine) cont current tx as ongoing His updated medication list for this problem includes:    Diazepam 5 Mg Tabs (Diazepam) .Marland Kitchen... 1/2-1 tab by mouth every 8 hours as needed for anxiety/muscle spasm  Problem # 3:  ADENOCARCINOMA, PROSTATE, METASTATIC (ICD-185) note from uro late 05/2009 reviewed -  cont tx as ongoing  reports dec PSA follows with uro Kimbrough for treatment - ongoing XRT to help with pain - narcs as per uro on Valium for dual effect on anxiety and muscle relax pain handicap tag application signed by me 04/2009 due to disabilty/weakness and need for RW  Problem # 4:  HERPES ZOSTER (ICD-053.9) Assessment: Improved will check on availablity and cost of zostavax -  Complete Medication List: 1)  Flomax 0.4 Mg Caps (Tamsulosin hcl) .... Take 1 by mouth every other day 2)  Colace 100 Mg Caps (Docusate sodium) .... Take 1 by mouth once daily 3)  Diazepam 5 Mg Tabs (Diazepam) .... 1/2-1 tab by mouth every 8 hours as needed for anxiety/muscle spasm 4)  Promethazine Hcl 12.5 Mg Tabs (Promethazine hcl) .Marland Kitchen.. 1 q 6 hrs as needed for nausea 5)  Amlodipine Besylate 10 Mg Tabs (Amlodipine besylate) .Marland Kitchen.. 1 by mouth once daily 6)  Oxycontin 20 Mg Xr12h-tab (Oxycodone hcl) .Marland Kitchen.. 1 by mouth two times a day - per uro 7)  Oxycodone Hcl 10 Mg Tabs (Oxycodone hcl) .... Use as needed for break-through pain 8)   Casodex 50 Mg Tabs (Bicalutamide) .... Takw 1 by mouth once daily 9)  Atenolol 50 Mg Tabs (Atenolol) .Marland Kitchen.. 1 by mouth once daily 10)  Triamcinolone Acetonide 0.1 % Crea (Triamcinolone acetonide) .... Apply to itch three times a day as needed  Patient Instructions: 1)  it was good to see you today. 2)  add atenolol for blood pressure in addition to current medications - 3)  also use triamcinolone cream to itch as needed  4)  your prescriptions have been electronically submitted to your pharmacy. Please take as directed. Contact our office if you believe you're  having problems with the medication(s).  5)  Please schedule a follow-up appointment in 3 months to monitor blood pressure and medications, sooner if problems.  6)  Check your Blood Pressure regularly. If it is above: 140/90 you should make an appointment. Prescriptions: TRIAMCINOLONE ACETONIDE 0.1 % CREA (TRIAMCINOLONE ACETONIDE) apply to itch three times a day as needed  #15g x 1   Entered and Authorized by:   Newt Lukes MD   Signed by:   Newt Lukes MD on 07/25/2009   Method used:   [facility]ically to        [facility]  [address] [id number]       [address]       [place], [place]  [address]       Ph: [phone] or [phone]       Fax: [phone]   RxID:   [id number] ATENOLOL 50 MG TABS (ATENOLOL) 1 by mouth once daily  #30 x 5   Entered and Authorized by:   Newt Lukes MD   Signed by:   Newt Lukes MD on 07/25/2009   Method used:   [facility] to        [facility]  [address] [id number]       [address]       [place], [place]  [address]       Ph: [phone] or [phone]       Fax: [phone]   RxID:   [id number]

## 2010-05-22 NOTE — Letter (Signed)
[facility]: [facility]   Imported By: Sherian Rein 02/28/2010 09:55:51  _____________________________________________________________________  External Attachment:    Type:   Image     Comment:   External Document

## 2010-05-22 NOTE — Assessment & Plan Note (Signed)
Summary: ZOSTAVAX/ VAL Natale Milch  Nurse Visit  CC: pt here for zostavax injection after pt verifing his date of birth injection was administered in his left arm subq, pt tolerated well/ ab   Allergies: No Known Drug Allergies  Immunizations Administered:  Zostavax # 1:    Vaccine Type: Zostavax    Site: left arm    Mfr: Merck    Dose: 0.5 ml    Route: [place]    Given by: Ami Bullins CMA    Exp. Date: 08/27/2010    Lot #: [id number]    VIS given: 02/01/05 given July 26, 2009.  Orders Added: 1)  Zoster (Shingles) Vaccine Live [[id number]] 2)  Admin 1st Vaccine [phone]

## 2010-05-24 NOTE — Letter (Signed)
Summary: [facility]   Imported By: Sherian Rein 05/01/2010 15:14:46  _____________________________________________________________________  External Attachment:    Type:   Image     Comment:   External Document

## 2010-06-04 ENCOUNTER — Telehealth: Payer: Self-pay | Admitting: Internal Medicine

## 2010-06-13 NOTE — Progress Notes (Signed)
Summary: atenolol  Phone Note Refill Request Message from:  Fax from Pharmacy on June 04, 2010 10:01 AM  Refills Requested: Medication #1:  ATENOLOL 50 MG TABS 1 by mouth once daily   Last Ref[facility]: [facility]/05/[facility]    Method Requested: Electronic Initial call taken by: Orlan Leavens RMA,  June 04, 2010 10:01 AM    Prescriptions: ATENOLOL 50 MG TABS (ATENOLOL) 1 by mouth once daily  #30 Tablet x 5   Entered by:   Orlan Leavens RMA   Authorized by:   Newt Lukes MD   Signed by:   Orlan Leavens RMA on 06/04/2010   Method used:   [facility]        [facility]  [address] [id number]       [address]       [place], [place]  [address]       Ph: [phone] or [phone]       Fax: [phone]   RxID:   [id number]

## 2010-07-03 ENCOUNTER — Encounter: Payer: Self-pay | Admitting: Internal Medicine

## 2010-07-10 NOTE — Letter (Signed)
Summary: [facility]   Imported By: Sherian Rein 07/06/2010 12:36:53  _____________________________________________________________________  External Attachment:    Type:   Image     Comment:   External Document

## 2010-07-11 ENCOUNTER — Encounter: Payer: Self-pay | Admitting: *Deleted

## 2010-07-26 LAB — MAGNESIUM: Magnesium: 2.4 mg/dL (ref 1.5–2.5)

## 2010-07-26 LAB — DIFFERENTIAL
Basophils Absolute: 0.1 10*3/uL (ref 0.0–0.1)
Basophils Relative: 0 % (ref 0–1)
Basophils Relative: 0 % (ref 0–1)
Eosinophils Absolute: 0.1 10*3/uL (ref 0.0–0.7)
Eosinophils Absolute: 0.2 10*3/uL (ref 0.0–0.7)
Eosinophils Relative: 1 % (ref 0–5)
Lymphocytes Relative: 29 % (ref 12–46)
Lymphs Abs: 4.2 10*3/uL — ABNORMAL HIGH (ref 0.7–4.0)
Monocytes Absolute: 1.3 10*3/uL — ABNORMAL HIGH (ref 0.1–1.0)
Monocytes Relative: 9 % (ref 3–12)
Neutrophils Relative %: 59 % (ref 43–77)

## 2010-07-26 LAB — COMPREHENSIVE METABOLIC PANEL
ALT: 15 U/L (ref 0–53)
Albumin: 2.9 g/dL — ABNORMAL LOW (ref 3.5–5.2)
Calcium: 8.8 mg/dL (ref 8.4–10.5)
GFR calc Af Amer: >60 mL/min (ref >60)
Glucose, Bld: 111 mg/dL — ABNORMAL HIGH (ref 70–99)
Sodium: 135 mEq/L (ref 135–145)
Total Protein: 5.5 g/dL — ABNORMAL LOW (ref 6.0–8.3)

## 2010-07-26 LAB — CBC
HCT: 33.3 % — ABNORMAL LOW (ref 39.0–52.0)
Hemoglobin: 9.8 g/dL — ABNORMAL LOW (ref 13.0–17.0)
MCHC: 33.4 g/dL (ref 30.0–36.0)
MCHC: 34.1 g/dL (ref 30.0–36.0)
MCV: 86.7 fL (ref 78.0–100.0)
MCV: 86.9 fL (ref 78.0–100.0)
Platelets: 101 10*3/uL — ABNORMAL LOW (ref 150–400)
Platelets: 108 10*3/uL — ABNORMAL LOW (ref 150–400)
RBC: 3.78 MIL/uL — ABNORMAL LOW (ref 4.22–5.81)
RBC: 3.84 MIL/uL — ABNORMAL LOW (ref 4.22–5.81)
RDW: 14.6 % (ref 11.5–15.5)
WBC: 13 10*3/uL — ABNORMAL HIGH (ref 4.0–10.5)

## 2010-07-26 LAB — BASIC METABOLIC PANEL
Chloride: 96 mEq/L (ref 96–112)
Creatinine, Ser: 0.45 mg/dL (ref 0.4–1.5)
GFR calc Af Amer: >60 mL/min (ref >60)
GFR calc Af Amer: >60 mL/min (ref >60)
Glucose, Bld: 116 mg/dL — ABNORMAL HIGH (ref 70–99)
Sodium: 134 mEq/L — ABNORMAL LOW (ref 135–145)
Sodium: 134 mEq/L — ABNORMAL LOW (ref 135–145)

## 2010-07-26 LAB — GLUCOSE, CAPILLARY
Glucose-Capillary: 105 mg/dL — ABNORMAL HIGH (ref 70–99)
Glucose-Capillary: 114 mg/dL — ABNORMAL HIGH (ref 70–99)

## 2010-07-26 LAB — URINE CULTURE
Colony Count: NO GROWTH
Culture: NO GROWTH
Special Requests: NEGATIVE

## 2010-07-26 LAB — URINALYSIS, ROUTINE W REFLEX MICROSCOPIC
Glucose, UA: NEGATIVE mg/dL
Ketones, ur: NEGATIVE mg/dL
Nitrite: NEGATIVE
pH: 6.5 (ref 5.0–8.0)

## 2010-07-26 LAB — APTT: aPTT: 28 seconds (ref 24–37)

## 2010-07-26 LAB — HEMOGLOBIN A1C
Hgb A1c MFr Bld: 6 % (ref 4.6–6.1)
Mean Plasma Glucose: 126 mg/dL

## 2010-07-26 LAB — PROTIME-INR: Prothrombin Time: 14.7 seconds (ref 11.6–15.2)

## 2010-08-01 ENCOUNTER — Ambulatory Visit (INDEPENDENT_AMBULATORY_CARE_PROVIDER_SITE_OTHER): Payer: Medicare Other | Admitting: Internal Medicine

## 2010-08-01 ENCOUNTER — Encounter: Payer: Self-pay | Admitting: Internal Medicine

## 2010-08-01 DIAGNOSIS — M5380 Other specified dorsopathies, site unspecified: Secondary | ICD-10-CM

## 2010-08-01 DIAGNOSIS — C61 Malignant neoplasm of prostate: Secondary | ICD-10-CM

## 2010-08-01 DIAGNOSIS — F411 Generalized anxiety disorder: Secondary | ICD-10-CM

## 2010-08-01 DIAGNOSIS — I10 Essential (primary) hypertension: Secondary | ICD-10-CM

## 2010-08-01 DIAGNOSIS — L299 Pruritus, unspecified: Secondary | ICD-10-CM

## 2010-08-01 MED ORDER — AMLODIPINE BESYLATE 10 MG PO TABS: 10.0000 mg | ORAL_TABLET | Freq: Every day | ORAL | Status: DC

## 2010-08-01 MED ORDER — TRIAMCINOLONE ACETONIDE 0.1 % EX CREA: TOPICAL_CREAM | Freq: Two times a day (BID) | CUTANEOUS | Status: DC

## 2010-08-01 NOTE — Patient Instructions (Addendum)
It was good to see you today. Medications reviewed, no changes at this time. we'll make referral to physical therapy for your back pain. Our office will contact you regarding appointment(s) once made. Refill on medication(s) as discussed today. Work on lifestyle changes as discussed (low fat, low carb diet diet; improved exercise efforts; weight loss) to control sugar, blood pressure and cholesterol levels and/or reduce risk of developing other medical problems. Please schedule followup in 6 months, call sooner if problems.

## 2010-08-01 NOTE — Assessment & Plan Note (Signed)
The current medical regimen is effective;  continue present plan and medications.  

## 2010-08-01 NOTE — Progress Notes (Signed)
  Subjective:    Patient ID: Jeff Jimenez, male    DOB: 1945/06/21, 65 y.o.   MRN: [id number]  HPI  here for 3 month followup Reviewed chronic medical issues:  HTN - home BP reviewed: range SBP 120-130s now on amlodipine 10mg  once daily with atenolol- BP readings exac by anxiety and pain - no adv SE related to med tx  - no edema no CP or HA  met prostate cancer - reports PSA undetctable - on hormone tx, s/p XRT to spine mets - pain mgmt by uro - controlled walking with walker only as needed (long exertion out of house)  anxiety - uses as needed valium, no inc in dose or freq reports compliance with ongoing medical treatment and no changes in medication dose or frequency. denies adverse side effects related to current therapy.   Past Medical History  Diagnosis Date  . Shingles 06/2009  . BPH (benign prostatic hyperplasia)   . DIVERTICULITIS, HX OF 02/23/2009  . ANEMIA-NOS   . ANXIETY   . Prostate cancer, primary, with metastasis from prostate to other site     T-L spine mets  . HYPERTENSION      Review of Systems  Constitutional: Negative for fever.  Respiratory: Negative for shortness of breath.   Cardiovascular: Negative for chest pain.  Neurological: Negative for headaches.       Objective:   Physical Exam  Constitutional: He is oriented to person, place, and time. He appears well-developed and well-nourished. No distress.       Overweight.  Wife at side  Cardiovascular: Normal rate, regular rhythm and normal heart sounds.   No murmur heard. Pulmonary/Chest: Effort normal and breath sounds normal. No respiratory distress. He has no wheezes.  Musculoskeletal:       Back: full range of motion of thoracic and lumbar spine. Non tender to palpation. Sensation intact in all dermatomes of the lower extremities. Full strength to manual muscle testing. the patient is able to heel toe walk without difficulty and ambulates with a normal gait.   Neurological: He is alert and  oriented to person, place, and time. Coordination normal.   Wt Readings from Last 3 Encounters:  08/01/10 186 lb 6.4 oz (84.55 kg)  01/31/10 177 lb 3.2 oz (80.377 kg)  11/28/09 173 lb 12.8 oz (78.835 kg)        Assessment & Plan:  See problem list. Medications and labs reviewed today.

## 2010-08-01 NOTE — Assessment & Plan Note (Signed)
BP Readings from Last 3 Encounters:  08/01/10 142/90  01/31/10 130/90  11/28/09 128/88   The current medical regimen is effective;  continue present plan and medications.

## 2010-08-01 NOTE — Assessment & Plan Note (Signed)
Dx 2010 - status post XRT to T-L spine mets, ongoing Lupron every 4 mo Follows with uro for same - PSA undetectable Pain control per uro - reviewed last OV

## 2010-08-01 NOTE — Assessment & Plan Note (Signed)
Continued low back ache - no longer felt related to met pain Refer back to PT, ?TENS unit or other therapy to help with same Pain mgmt as ongoing

## 2010-08-25 ENCOUNTER — Other Ambulatory Visit: Payer: Self-pay | Admitting: Internal Medicine

## 2010-12-04 ENCOUNTER — Other Ambulatory Visit: Payer: Self-pay | Admitting: Internal Medicine

## 2011-01-30 ENCOUNTER — Encounter: Payer: Self-pay | Admitting: Internal Medicine

## 2011-01-30 ENCOUNTER — Ambulatory Visit (INDEPENDENT_AMBULATORY_CARE_PROVIDER_SITE_OTHER): Payer: Medicare Other | Admitting: Internal Medicine

## 2011-01-30 ENCOUNTER — Other Ambulatory Visit (INDEPENDENT_AMBULATORY_CARE_PROVIDER_SITE_OTHER): Payer: Medicare Other

## 2011-01-30 ENCOUNTER — Other Ambulatory Visit: Payer: Self-pay | Admitting: Internal Medicine

## 2011-01-30 VITALS — BP 132/88 | HR 72 | Temp 97.9°F | Wt 189.4 lb

## 2011-01-30 DIAGNOSIS — R5383 Other fatigue: Secondary | ICD-10-CM

## 2011-01-30 DIAGNOSIS — R5381 Other malaise: Secondary | ICD-10-CM

## 2011-01-30 DIAGNOSIS — Z23 Encounter for immunization: Secondary | ICD-10-CM

## 2011-01-30 DIAGNOSIS — I10 Essential (primary) hypertension: Secondary | ICD-10-CM

## 2011-01-30 DIAGNOSIS — M5380 Other specified dorsopathies, site unspecified: Secondary | ICD-10-CM

## 2011-01-30 DIAGNOSIS — C61 Malignant neoplasm of prostate: Secondary | ICD-10-CM

## 2011-01-30 LAB — TSH: TSH: 1.44 u[IU]/mL (ref 0.35–5.50)

## 2011-01-30 LAB — CBC WITH DIFFERENTIAL/PLATELET
Eosinophils Absolute: 0.4 10*3/uL (ref 0.0–0.7)
MCHC: 33.2 g/dL (ref 30.0–36.0)
MCV: 86.1 fl (ref 78.0–100.0)
Monocytes Absolute: 0.7 10*3/uL (ref 0.1–1.0)
Neutrophils Relative %: 65.4 % (ref 43.0–77.0)
Platelets: 308 10*3/uL (ref 150.0–400.0)
RDW: 13.7 % (ref 11.5–14.6)

## 2011-01-30 LAB — BASIC METABOLIC PANEL
BUN: 18 mg/dL (ref 6–23)
CO2: 28 mEq/L (ref 19–32)
Chloride: 106 mEq/L (ref 96–112)
Creatinine, Ser: 0.6 mg/dL (ref 0.4–1.5)

## 2011-01-30 LAB — HEPATIC FUNCTION PANEL
Alkaline Phosphatase: 86 U/L (ref 39–117)
Bilirubin, Direct: 0.1 mg/dL (ref 0.0–0.3)

## 2011-01-30 LAB — LDL CHOLESTEROL, DIRECT: Direct LDL: 98.3 mg/dL

## 2011-01-30 LAB — LIPID PANEL
Cholesterol: 171 mg/dL (ref 0–200)
Triglycerides: 220 mg/dL — ABNORMAL HIGH (ref 0.0–149.0)

## 2011-01-30 MED ORDER — CLOTRIMAZOLE-BETAMETHASONE 1-0.05 % EX CREA: TOPICAL_CREAM | Freq: Two times a day (BID) | CUTANEOUS | Status: DC | PRN

## 2011-01-30 NOTE — Assessment & Plan Note (Signed)
Dx 2010 - status post XRT to T-L spine mets, ongoing Lupron every 4 mo Follows with uro for same - PSA undetectable Pain control per uro - reviewed last OV

## 2011-01-30 NOTE — Progress Notes (Signed)
  Subjective:    Patient ID: Jeff Jimenez, male    DOB: 01/06/1946, 65 y.o.   MRN: [id number]  HPI   here for 3 month followup Reviewed chronic medical issues:  HTN - home BP reviewed: range SBP 120-130s now on amlodipine 10mg  once daily with atenolol- BP readings exac by anxiety and pain - no adv SE related to med tx  - no edema no CP or HA  met prostate cancer - reports PSA undetctable - on hormone tx, s/p XRT to spine mets - pain mgmt by uro - controlled walking with walker only as needed (long exertion out of house)  anxiety - uses as needed valium, no inc in dose or freq reports compliance with ongoing medical treatment and no changes in medication dose or frequency. denies adverse side effects related to current therapy.   Past Medical History  Diagnosis Date  . Shingles 06/2009  . BPH (benign prostatic hyperplasia)   . DIVERTICULITIS, HX OF 2010  . ANEMIA-NOS   . ANXIETY   . HYPERTENSION   . Prostate cancer, primary, with metastasis from prostate to other site 01/2009 dx    T-L spine mets     Review of Systems  Constitutional: Negative for fever.  Respiratory: Negative for shortness of breath.   Cardiovascular: Negative for chest pain.  Neurological: Negative for headaches.       Objective:   Physical Exam BP 132/88  Pulse 72  Temp(Src) 97.9 F (36.6 C) (Oral)  Wt 189 lb 6.4 oz (85.911 kg)  SpO2 97%  Wt Readings from Last 3 Encounters:  01/30/11 189 lb 6.4 oz (85.911 kg)  08/01/10 186 lb 6.4 oz (84.55 kg)  01/31/10 177 lb 3.2 oz (80.377 kg)    Constitutional: He is well-developed and well-nourished. No distress.  Overweight.  Wife at side  Cardiovascular: Normal rate, regular rhythm and normal heart sounds. No murmur heard. no BLE edema Pulmonary/Chest: Effort normal and breath sounds normal. No respiratory distress. He has no wheezes.  Musculoskeletal:  Back: full range of motion of thoracic and lumbar spine. Non tender to palpation. Sensation  intact in all dermatomes of the lower extremities. Full strength to manual muscle testing. the patient is able to heel toe walk without difficulty and ambulates with a normal gait.  Neurological: He is alert and oriented to person, place, and time. Coordination normal.        Assessment & Plan:  See problem list. Medications and labs reviewed today.

## 2011-01-30 NOTE — Patient Instructions (Signed)
It was good to see you today. Medications reviewed, try different cream for thigh rash but no other changes at this time. - Your prescription(s) have been submitted to your pharmacy. Please take as directed and contact our office if you believe you are having problem(s) with the medication(s). Test(s) ordered today. Your results will be called to you after review (48-72hours after test completion). If any changes need to be made, you will be notified at that time. Continue to work on lifestyle changes as discussed (low fat, low carb diet diet; improved exercise efforts; weight loss) to control sugar, blood pressure and cholesterol levels and/or reduce risk of developing other medical problems. Please schedule followup in 6 months for blood pressure review, call sooner if problems.

## 2011-01-30 NOTE — Assessment & Plan Note (Signed)
BP Readings from Last 3 Encounters:  01/30/11 132/88  08/01/10 142/90  01/31/10 130/90   The current medical regimen is effective;  continue present plan and medications.

## 2011-01-30 NOTE — Assessment & Plan Note (Signed)
Continued low back ache - no longer felt related to met pain status post  PT, was declined forTENS unit  Pain mgmt as ongoing

## 2011-02-14 ENCOUNTER — Other Ambulatory Visit: Payer: Self-pay | Admitting: Internal Medicine

## 2011-05-18 ENCOUNTER — Other Ambulatory Visit: Payer: Self-pay | Admitting: Internal Medicine

## 2011-07-31 ENCOUNTER — Ambulatory Visit (INDEPENDENT_AMBULATORY_CARE_PROVIDER_SITE_OTHER): Payer: Medicare Other | Admitting: Internal Medicine

## 2011-07-31 ENCOUNTER — Encounter: Payer: Self-pay | Admitting: Internal Medicine

## 2011-07-31 VITALS — BP 136/90 | HR 77 | Temp 97.2°F | Ht 65.0 in | Wt 194.0 lb

## 2011-07-31 DIAGNOSIS — I10 Essential (primary) hypertension: Secondary | ICD-10-CM

## 2011-07-31 DIAGNOSIS — C61 Malignant neoplasm of prostate: Secondary | ICD-10-CM

## 2011-07-31 MED ORDER — TRIAMCINOLONE ACETONIDE 0.1 % EX OINT: TOPICAL_OINTMENT | Freq: Two times a day (BID) | CUTANEOUS | Status: DC | PRN

## 2011-07-31 MED ORDER — AMLODIPINE BESYLATE 10 MG PO TABS: 10.0000 mg | ORAL_TABLET | Freq: Every day | ORAL | Status: DC

## 2011-07-31 MED ORDER — ATENOLOL 50 MG PO TABS: 75.0000 mg | ORAL_TABLET | Freq: Every day | ORAL | Status: DC

## 2011-07-31 NOTE — Assessment & Plan Note (Signed)
Dx 2010 - status post XRT to T-L spine mets, ongoing Lupron every 4 mo and casodex resumed 03/2011 Follows with uro [place] for same q3-30mo - PSA undetectable Pain control per uro - reviewed last OV

## 2011-07-31 NOTE — Assessment & Plan Note (Signed)
BP Readings from Last 3 Encounters:  07/31/11 136/90  01/30/11 132/88  08/01/10 142/90  subop control at home Will increase Bbloc - pt to call if intolerable fatigue, continue amlopine at max dose

## 2011-07-31 NOTE — Patient Instructions (Signed)
It was good to see you today. Increase atenolol to 1.5 tab (75mg ) daily for blood pressure - continue amlodipine at max dose as ongoing - call if SBP>140s or other problems Use kenalog ointment for rash as needed - call if worse Your prescription(s) have been submitted to your pharmacy. Please take as directed and contact our office if you believe you are having problem(s) with the medication(s). Miralax (or generic) each AM to help control constipation - continue colace 1-3 tab at bedtime  Watch the weight gain!!! -Continue to work on lifestyle changes as discussed (low fat, low carb diet diet; improved exercise efforts; weight loss) to control sugar, blood pressure and cholesterol levels and/or reduce risk of developing other medical problems. Please schedule followup in 6 months for blood pressure review and lans, call sooner if problems.

## 2011-07-31 NOTE — Progress Notes (Signed)
  Subjective:    Patient ID: Jeff Jimenez, male    DOB: 11-01-1945, 66 y.o.   MRN: [id number]  HPI  here for 6 month followup - reviewed chronic medical issues:  HTN - home BP reviewed: range SBP 130-150s on amlodipine 10mg  once daily with atenolol- BP readings exac by anxiety and pain - no side effects related to med tx  - no edema,chest pain or headache   met prostate cancer - reports PSA undetctable - on hormone tx, s/p XRT to spine mets - pain mgmt by uro - overall controlled walking with walker only as needed (long exertion out of house)  anxiety - uses as needed valium, no increase in dose or frequency reports compliance with ongoing medical treatment and no changes in medication dose or frequency. denies adverse side effects related to current therapy.   Past Medical History  Diagnosis Date  . Shingles 06/2009  . BPH (benign prostatic hyperplasia)   . DIVERTICULITIS, HX OF 2010  . ANEMIA-NOS   . ANXIETY   . HYPERTENSION   . 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 Negative for fever.  Respiratory: Negative for cough and shortness of breath.   Cardiovascular: Negative for chest pain and palpitations.  Neurological: Negative for headaches.       Objective:   Physical Exam  BP 136/90  Pulse 77  Temp(Src) 97.2 F (36.2 C) (Oral)  Ht 5\' 5"  (1.651 m)  Wt 194 lb (87.998 kg)  BMI 32.28 kg/m2  SpO2 94% Wt Readings from Last 3 Encounters:  07/31/11 194 lb (87.998 kg)  01/30/11 189 lb 6.4 oz (85.911 kg)  08/01/10 186 lb 6.4 oz (84.55 kg)   Constitutional: He is well-developed and well-nourished. No distress.  Overweight.  Wife at side  Cardiovascular: Normal rate, regular rhythm and normal heart sounds. No murmur heard. no BLE edema Pulmonary/Chest: Effort normal and breath sounds normal. No respiratory distress. He has no wheezes.    Musculoskeletal:  Back: full range of motion of thoracic and lumbar spine. Non tender to palpation. Sensation intact in all dermatomes of the lower extremities. Full strength to manual muscle testing. the patient is able to heel toe walk without difficulty and ambulates with a normal gait.  Neurological: He is alert and oriented to person, place, and time. Coordination normal.   Lab Results  Component Value Date   WBC 10.2 01/30/2011   HGB 14.4 01/30/2011   HCT 43.4 01/30/2011   PLT 308.0 01/30/2011   GLUCOSE 112* 01/30/2011   CHOL 171 01/30/2011   TRIG 220.0* 01/30/2011   HDL 38.20* 01/30/2011   LDLDIRECT 98.3 01/30/2011   LDLCALC 112* 01/31/2010   ALT 52 01/30/2011   AST 49* 01/30/2011   NA 141 01/30/2011   K 4.1 01/30/2011   CL 106 01/30/2011   CREATININE 0.6 01/30/2011   BUN 18 01/30/2011   CO2 28 01/30/2011   TSH 1.44 01/30/2011   PSA  Value: 14.87 (NOTE) Result repeated and verified. Test Methodology: [employer] PSA* 01/28/2009   INR 1.16 01/28/2009   HGBA1C  Value: 6.0 . 01/30/2009        Assessment & Plan:  See problem list. Medications and labs reviewed today.

## 2011-08-06 ENCOUNTER — Other Ambulatory Visit: Payer: Self-pay | Admitting: Internal Medicine

## 2011-10-17 ENCOUNTER — Telehealth: Payer: Self-pay | Admitting: Oncology

## 2011-10-17 NOTE — Telephone Encounter (Signed)
S/w pt re appt for 7/12 @ 1:30 pm w/FS. appt for 7/11 not available.

## 2011-10-18 ENCOUNTER — Telehealth: Payer: Self-pay | Admitting: Oncology

## 2011-10-18 NOTE — Telephone Encounter (Signed)
Referred by Dr. Jerilee Field Dx-Mets Prostate Ca

## 2011-10-25 ENCOUNTER — Other Ambulatory Visit: Payer: Self-pay | Admitting: Oncology

## 2011-10-25 DIAGNOSIS — C61 Malignant neoplasm of prostate: Secondary | ICD-10-CM

## 2011-11-01 ENCOUNTER — Ambulatory Visit (HOSPITAL_BASED_OUTPATIENT_CLINIC_OR_DEPARTMENT_OTHER): Payer: Medicare Other | Admitting: Oncology

## 2011-11-01 ENCOUNTER — Encounter: Payer: Self-pay | Admitting: Oncology

## 2011-11-01 ENCOUNTER — Telehealth: Payer: Self-pay | Admitting: Oncology

## 2011-11-01 ENCOUNTER — Other Ambulatory Visit: Payer: Medicare Other | Admitting: Lab

## 2011-11-01 ENCOUNTER — Ambulatory Visit: Payer: Medicare Other

## 2011-11-01 VITALS — BP 155/90 | HR 74 | Temp 98.7°F | Ht 65.0 in | Wt 193.9 lb

## 2011-11-01 DIAGNOSIS — C61 Malignant neoplasm of prostate: Secondary | ICD-10-CM

## 2011-11-01 LAB — COMPREHENSIVE METABOLIC PANEL
BUN: 9 mg/dL (ref 6–23)
CO2: 28 mEq/L (ref 19–32)
Calcium: 9.1 mg/dL (ref 8.4–10.5)
Chloride: 104 mEq/L (ref 96–112)
Creatinine, Ser: 0.6 mg/dL (ref 0.50–1.35)
Glucose, Bld: 175 mg/dL — ABNORMAL HIGH (ref 70–99)
Total Bilirubin: 0.4 mg/dL (ref 0.3–1.2)

## 2011-11-01 LAB — CBC WITH DIFFERENTIAL/PLATELET
BASO%: 0.6 % (ref 0.0–2.0)
Basophils Absolute: 0.1 10*3/uL (ref 0.0–0.1)
Eosinophils Absolute: 0.5 10*3/uL (ref 0.0–0.5)
HCT: 41.5 % (ref 38.4–49.9)
HGB: 13.3 g/dL (ref 13.0–17.1)
LYMPH%: 26.4 % (ref 14.0–49.0)
MCHC: 32 g/dL (ref 32.0–36.0)
MONO#: 0.6 10*3/uL (ref 0.1–0.9)
NEUT%: 63.6 % (ref 39.0–75.0)
Platelets: 289 10*3/uL (ref 140–400)
WBC: 11.4 10*3/uL — ABNORMAL HIGH (ref 4.0–10.3)
lymph#: 3 10*3/uL (ref 0.9–3.3)

## 2011-11-01 LAB — PSA: PSA: 1.1 ng/mL (ref <=4.00)

## 2011-11-01 LAB — TESTOSTERONE: Testosterone: <10 ng/dL — ABNORMAL LOW (ref 300–890)

## 2011-11-01 NOTE — Progress Notes (Signed)
Note dictated

## 2011-11-01 NOTE — Progress Notes (Signed)
CC:   Jeff A. Felicity Coyer, MD Jeff Field, MD  REASON FOR CONSULTATION:  Prostate cancer.  HISTORY OF PRESENT ILLNESS:  Jeff Jimenez is a pleasant 67 year old gentleman, native of [place], [place], currently of [place]. He is currently retired.  Worked multiple occupations, predominantly in [employer] and also worked in the [employer] in the past.  He is currently retired and does have a few comorbid conditions including mild obesity and hypertension.  His history of prostate cancer dates back to 2010, where he started developing lower back pain that was refractory to regular intervention and subsequently ended up being evaluated in the emergency department.  At that time he had an MR of the thoracic and lumbar spine, which showed diffuse thoracic osseous metastasis between T6 and T7, pathological anterior wedge compression fracture of T5, 25% loss of vertebral body height.  An MR of the lumbar spine showed diffuse lumbar metastasis with expansion of vertebral body of L5  __________ small mass effects and potentially affecting L5 nerve root.  There is also compression fracture of L2 and L4 at that time.  A CT scan also of the abdomen and pelvis as long as the chest was done also on October of 2010.  At that time showed widespread osseous metastasis, extensive retroperitoneal adenopathy and osseous metastasis most likely coming from the prostate at that time.  The retroperitoneal adenopathy indicating a left periaortic node measuring 2.1 x 1.8 cm at that time. The patient was a evaluated and found to have a PSA of 15.  Underwent a prostate biopsy under the care of Dr. Aldean Jimenez on February 02, 2009. His biopsy showed a Gleason score 5+4=9 in 5 out of the 8 cores.  The patient was started on OxyContin and oxycodone for pain from a pain medication standpoint and also started on Lupron and Casodex for his prostate cancer.  His PSA nadir down to 0.02 back in March of 2012.  In July  of 2012, his PSA was up to 0.6.  Casodex was stopped temporarily up until November of 2012.  His PSA went up to 0.09.  Casodex was restarted in November of 2012.  In March of 2013, his PSA was 0.27 and most recently, his PSA was up to 0.76 with a castrate level of testosterone of 17.7.  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  Clinically he is asymptomatic at this time.  He still has lower back pain but really has not really changed in nature.  He takes the OxyContin 20 mg twice a day without really major changes in his back pain consistency or the intensity of his pain.  He is not reporting any radiation of his pain. He is taking oxycodone about 2 tablets a day, at times even less.  He is still very functional, is able to drive, perform most activities of daily living.  His urine flow is reasonable with the help of Flomax, without it he has had some urine stream difficulties, but otherwise he had not had any weight loss, has not had any appetite changes, has not had any deterioration in his quality of life.  REVIEW OF SYSTEMS:  Does not report any headaches, blurry vision, double vision.  Does not report any motor or sensory neuropathy.  Did not report any alteration in mental status, psychiatric issues, depression. Did not report any fever, chills, sweats.  Does not report any cough, hemoptysis, hematemesis.  No nausea or vomiting.  Does  not report any reporting abdominal pain, hematochezia, melena or genitourinary complaints.  Rest of the review of systems is unremarkable.  PAST MEDICAL HISTORY:  Significant for obesity, hypertension, history of herpes zoster, history of ankle surgery, status post vasectomy.  Has a history of anxiety, diverticulitis and BPH as well as prostate cancer.  MEDICATIONS:  He is on OxyContin 20 mg every 12 hours, oxycodone 5 mg 2 tablets every 4 hours as needed, Colace, amlodipine 10  mg daily, atenolol 50 mg tablets take 1.5 tablets daily, Flomax 0.4 mg as needed. He is on calcium with D twice a day and bicalutamide 50 mg daily.  ALLERGIES:  None.  SOCIAL HISTORY:  He is married.  He has 2 daughters, a number of grand. Children.  Denied any alcohol or tobacco use.  FAMILY HISTORY:  No history of prostate cancer that he can recall.  His father had cardiac complications, died from that.  His mother has hypertension.  He has a brother, who is in good health and does not have prostate problems.  Sister had a stroke.  PHYSICAL EXAMINATION:  Alert, awake, pleasant gentleman, appeared in no active distress.  His blood pressure today he is 155/90, pulse 74, respiration 20, weighs 193 pounds.  Head:  Normocephalic, atraumatic. Pupils equal and round, reactive to light.  Oral mucosa moist and pink. Neck:  Supple without adenopathy.  Heart:  Regular rate, S1, S2.  Lungs: Clear to auscultation.  No rhonchi, wheeze or dullness to percussion. Abdomen:  Soft, nontender.  No hepatosplenomegaly.  Extremities:  No clubbing, cyanosis, or edema.  NEUROLOGIC:  Intact motor, sensory,and deep tendon reflexes.  LABORATORY DATA:  Laboratory data today showed a hemoglobin of 13.3, white cell count 11.4, platelet count of 289.  ASSESSMENT AND PLAN:  This is a pleasant 66 year old gentleman with the following issues: 1. Prostate cancer.  His diagnosis dates back to October of 2010.  He     had a prostate-specific antigen of 15, Gleason score of 5+ 4=9.  He     presented with advanced disease with retroperitoneal lymph node as     well as bony metastasis.  His past treatments include Lupron and     Casodex initially with the Casodex a prostate-specific antigen     nadir down to 0.02.  However, most recently his PSA developed a     rise with his most recent prostate-specific antigen to 0.76 with a     castrate level of testosterone of 17.  I discussed today the     natural course of  prostate cancer,  most specifically castration-     resistant disease, with Jeff Jimenez.  I believe that he is very     getting very close to castration-resistant disease.  I think given     his Gleason score and the extensive nature of his disease, it is     not surprising that he is developing castration-resistant disease,     especially that he has been on hormone-sensitive for close to 3     years now.  The 1st step in terms of management in this at this     point, I would like to repeat his prostate-specific antigen now     since it has not been checked in about a month to confirm and make     sure that we are dealing with a rapid doubling time in whole     numbers rather than fractions.  The next step will be a  restaging     workup since it has not been done, including a CT scan and a bone     scan, and then depending on that we will talk about different     treatment options.  I have outlined different treatment options     today for Jeff Jimenez, including second-line hormone manipulation with     ketoconazole and prednisone or Zytiga.  I talked to him about     systemic chemotherapy, also options of Provenge immunotherapy among     other treatments.  I have given him written information about     Zytiga today, which I feel that is probably the best choice for     him.  However, my final recommendation will come after I complete     the staging workup and his prostate-specific antigen.  I also     probably will instruct him to stop the Casodex if his prostate-     specific antigen has indeed continued to rise. 2. Bony disease.  I do agree with Rivka Barbara, which is planned to have that     done in near future.  He is getting his teeth checked.  Anticipate     that to be under the care of Dr. Mena Goes.  If not, we can     certainly be more than happy to do that here. 3. Androgen deprivation.  He is currently on Lupron 22.5 mg every 3     months.  I will recommend continuing that for the time  being. 4. Pain.  His bony pain is under reasonable control with OxyContin and     oxycodone.  Would not really change     that at this time. Followup will be in a few weeks after he has completed his staging workup.   ______________________________ Benjiman Core, M.D. FNS/MEDQ  D:  11/01/2011  T:  11/01/2011  Job:  [id number]

## 2011-11-01 NOTE — Progress Notes (Signed)
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

## 2011-11-01 NOTE — Telephone Encounter (Signed)
appts made and printed for pt aom °

## 2011-11-06 ENCOUNTER — Encounter (HOSPITAL_COMMUNITY)
Admission: RE | Admit: 2011-11-06 | Discharge: 2011-11-06 | Disposition: A | Discharge status: Home / Self Care | Payer: Medicare Other | Source: Ambulatory Visit | Attending: Oncology | Admitting: Oncology

## 2011-11-06 ENCOUNTER — Ambulatory Visit (HOSPITAL_COMMUNITY)
Admission: RE | Admit: 2011-11-06 | Discharge: 2011-11-06 | Disposition: A | Discharge status: Home / Self Care | Payer: Medicare Other | Source: Ambulatory Visit | Attending: Oncology | Admitting: Oncology

## 2011-11-06 ENCOUNTER — Encounter (HOSPITAL_COMMUNITY): Payer: Self-pay

## 2011-11-06 DIAGNOSIS — C61 Malignant neoplasm of prostate: Secondary | ICD-10-CM

## 2011-11-06 DIAGNOSIS — K7689 Other specified diseases of liver: Secondary | ICD-10-CM | POA: Insufficient documentation

## 2011-11-06 DIAGNOSIS — C7951 Secondary malignant neoplasm of bone: Secondary | ICD-10-CM | POA: Insufficient documentation

## 2011-11-06 DIAGNOSIS — I712 Thoracic aortic aneurysm, without rupture, unspecified: Secondary | ICD-10-CM | POA: Insufficient documentation

## 2011-11-06 DIAGNOSIS — R161 Splenomegaly, not elsewhere classified: Secondary | ICD-10-CM | POA: Insufficient documentation

## 2011-11-06 DIAGNOSIS — K5900 Constipation, unspecified: Secondary | ICD-10-CM | POA: Insufficient documentation

## 2011-11-06 DIAGNOSIS — N200 Calculus of kidney: Secondary | ICD-10-CM | POA: Insufficient documentation

## 2011-11-06 DIAGNOSIS — R599 Enlarged lymph nodes, unspecified: Secondary | ICD-10-CM | POA: Insufficient documentation

## 2011-11-06 MED ORDER — IOHEXOL 300 MG/ML  SOLN
100.0000 mL | Freq: Once | INTRAMUSCULAR | Status: AC | PRN
  Administered 2011-11-06: 100 mL via INTRAVENOUS

## 2011-11-06 MED ORDER — TECHNETIUM TC 99M MEDRONATE IV KIT
25.0000 | PACK | Freq: Once | INTRAVENOUS | Status: AC | PRN
  Administered 2011-11-06: 25 via INTRAVENOUS

## 2011-11-21 ENCOUNTER — Telehealth: Payer: Self-pay | Admitting: Oncology

## 2011-11-21 ENCOUNTER — Ambulatory Visit (HOSPITAL_BASED_OUTPATIENT_CLINIC_OR_DEPARTMENT_OTHER): Payer: Medicare Other | Admitting: Oncology

## 2011-11-21 VITALS — BP 147/91 | HR 75 | Temp 98.4°F | Ht 65.0 in | Wt 195.5 lb

## 2011-11-21 DIAGNOSIS — C7951 Secondary malignant neoplasm of bone: Secondary | ICD-10-CM

## 2011-11-21 DIAGNOSIS — C61 Malignant neoplasm of prostate: Secondary | ICD-10-CM

## 2011-11-21 DIAGNOSIS — E291 Testicular hypofunction: Secondary | ICD-10-CM

## 2011-11-21 NOTE — Telephone Encounter (Signed)
gv pt appt schedule for September.  °

## 2011-11-21 NOTE — Progress Notes (Signed)
Hematology and Oncology Follow Up Visit  Jeff Jimenez [id number] 06-Sep-1945 66 y.o. 11/21/2011 3:10 PM    Principle Diagnosis: 65 year old diagnosed with advanced prostate cancer in 2010 with disease in the pelvic adenopathy and bone disease.  Gleason score 5+4=9 in 5 out of the 8 cores, PSA 15.  Prior Therapy: He was started on Lupron and Casodex for his prostate cancer. His PSA nadir down to 0.02 back in March of 2012. In July of 2012, his PSA was up to 0.6. Casodex was stopped temporarily up until November of 2012. His PSA went up to 0.09. Casodex was restarted  in November of 2012. In March of 2013, his PSA was 0.27 and most recently, his PSA was up to 0.76 with a castrate level of testosterone of 17.7.  Current therapy: Lupron, casodex and monthly Xgeva given  at [facility] urology.  Interim History: 66 year old presents today for a follow up visit after I saw him on 11/21/2011. Since his last visit, He is still very functional, is able to drive, perform most activities of daily living. His urine flow is reasonable with the help of Flomax, without it he has had some urine stream difficulties, but otherwise he had not had any weight loss, has not had any appetite changes, has not had any deterioration in his quality of life.    Medications: I have reviewed the patient's current medications. Current outpatient prescriptions:amLODipine (NORVASC) 10 MG tablet, TAKE 1 TABLET EVERY DAY, Disp: 30 tablet, Rfl: 5;  atenolol (TENORMIN) 50 MG tablet, Take 1.5 tablets (75 mg total) by mouth daily., Disp: 30 tablet, Rfl: 11;  bicalutamide (CASODEX) 50 MG tablet, Take 1 by mouth daily, Disp: , Rfl: ;  Calcium Carbonate-Vitamin D (CALCIUM 600+D) 600-400 MG-UNIT per tablet, Take 1 tablet by mouth daily.  , Disp: , Rfl:  Denosumab (XGEVA [place]), Inject 120 mg into the skin every 30 (thirty) days. Patient receives injection at urologists office, Disp: , Rfl: ;  docusate sodium (COLACE) 100 MG capsule, Take 100 mg by  mouth daily.  , Disp: , Rfl: ;  oxycodone (OXY-IR) 5 MG capsule, Take 10 mg by mouth every 4 (four) hours as needed., Disp: , Rfl: ;  oxyCODONE (OXYCONTIN) 20 MG 12 hr tablet, Take 20 mg by mouth every 12 (twelve) hours.  , Disp: , Rfl:  Tamsulosin HCl (FLOMAX) 0.4 MG CAPS, Take 0.4 mg by mouth every other day.  , Disp: , Rfl:   Allergies: No Known Allergies  Past Medical History, Surgical history, Social history, and Family History were reviewed and updated.  Review of Systems: Constitutional:  Negative for fever, chills, night sweats, anorexia, weight loss, pain. Cardiovascular: no chest pain or dyspnea on exertion Respiratory: negative Neurological: negative Dermatological: negative ENT: negative Skin: Negative. Gastrointestinal: negative Genito-Urinary: negative Hematological and Lymphatic: negative Breast: negative Musculoskeletal: negative Remaining ROS negative. Physical Exam: Blood pressure 147/91, pulse 75, temperature 98.4 F (36.9 C), temperature source Oral, height 5\' 5"  (1.651 m), weight 195 lb 8 oz (88.678 kg). ECOG: 1 General appearance: alert Head: Normocephalic, without obvious abnormality, atraumatic Neck: no adenopathy, no carotid bruit, no JVD, supple, symmetrical, trachea midline and thyroid not enlarged, symmetric, no tenderness/mass/nodules Lymph nodes: Cervical, supraclavicular, and axillary nodes normal. Heart:regular rate and rhythm, S1, S2 normal, no murmur, click, rub or gallop Lung:chest clear, no wheezing, rales, normal symmetric air entry Abdomin: soft, non-tender, without masses or organomegaly EXT:no erythema, induration, or nodules   Lab Results: Lab Results  Component Value Date  WBC 11.4* 11/01/2011   HGB 13.3 11/01/2011   HCT 41.5 11/01/2011   MCV 86.0 11/01/2011   PLT 289 11/01/2011     Chemistry      Component Value Date/Time   NA 140 11/01/2011 1325   K 3.9 11/01/2011 1325   CL 104 11/01/2011 1325   CO2 28 11/01/2011 1325   BUN 9  11/01/2011 1325   CREATININE 0.60 11/01/2011 1325      Component Value Date/Time   CALCIUM 9.1 11/01/2011 1325   ALKPHOS 101 11/01/2011 1325   AST 34 11/01/2011 1325   ALT 37 11/01/2011 1325   BILITOT 0.4 11/01/2011 1325     Results for Jeff, Jimenez (MRN [id number]) as of 11/21/2011 15:18  Ref. Range 11/01/2011 13:25  PSA Latest Range: <=4.00 ng/mL 1.10    Radiological Studies: CT CHEST, ABDOMEN AND PELVIS WITH CONTRAST  Technique: Contiguous axial images of the chest abdomen and pelvis  were obtained after IV contrast administration.  Contrast: 100 ml Omnipaque-300  Comparison: 01/28/2009  CT CHEST  Findings: Lung windows demonstrate no nodules or airspace  opacities. Minimal dependent subsegmental atelectasis at the  bases.  Soft tissue windows demonstrate no supraclavicular adenopathy.  Right sided gynecomastia. Mild ascending aortic aneurysm. 4.4 -  4.5 cm on image 26 versus 3.8-4.2 cm on the prior. Tortuous  descending segment. Mild cardiomegaly, without pericardial or  pleural effusion. New pulmonary artery enlargement. Outflow tract  at 3.5 cm.  Small middle mediastinal nodes, without mediastinal or hilar  adenopathy. No central pulmonary embolism, on this non-dedicated  study. Small paraesophageal nodes which are not significantly  changed and likely reactive.  IMPRESSION:  1. No evidence of extra osseous metastasis within the chest.  2. Slight increase in mild ascending aortic aneurysm.  3. Pulmonary artery enlargement suggests pulmonary arterial  hypertension.  CT ABDOMEN AND PELVIS  Findings: Moderate hepatic steatosis. Hepatomegaly, 19.3 cm  cranial caudal. Normal spleen, stomach, pancreas, gallbladder,  biliary tract, adrenal glands. Bilateral renal collecting system  calculi. Resolution of previously described retroperitoneal  adenopathy. Porta hepatis nodes which are likely related to  steatosis are similar and not pathologic by size criteria.  Borderline  retrocrural adenopathy is resolved.  Colonic stool burden suggests constipation.  Scattered colonic diverticula. Normal terminal ileum and appendix.  Normal small bowel without abdominal ascites.  Bilateral fat containing inguinal hernias.  Overall improved pelvic adenopathy. Index right common iliac node  measures 8 mm on image 84 versus 1.4 cm on the prior. A left  obturator nodule (presumably node) measures 1.8 x 1.3 cm on image  98 compared to 1.5 x 1.2 cm on the prior.  Prostatomegaly. Apparent nodule extending into the urinary bladder  is presumably due to a prominent median lobe of prostate. Example  image 104. No significant free fluid.  Widespread osseous metastasis again identified. Slightly decreased  severity of relatively diffuse sclerosis. The focal expansile  lesion at T8 is no longer apparent. There are has been interval  development of numerous compression deformities. Moderate at T3  and T4, mild to moderate at T11, moderate to severe at L2 and  moderate at L4. Mild canal encroachment at L2. Mild kyphosis  centered about T3-T4.  IMPRESSION:  1. Overall response to therapy of abdominal pelvic adenopathy.  There is a single left pelvic node which is enlarged. Other nodes  have markedly improved to resolved, as detailed above.  2. Response to therapy of osseous metastasis. Development of  numerous thoracolumbar compression deformities, as  detailed. These  are presumably pathologic.  3. Hepatic steatosis and hepatomegaly.  4. Renal calculi.   NUCLEAR MEDICINE WHOLE BODY BONE SCINTIGRAPHY  Technique: Whole body anterior and posterior images were obtained  approximately 3 hours after intravenous injection of  radiopharmaceutical.  Radiopharmaceutical: CURIE TC-MDP TECHNETIUM TC 42M  MEDRONATE IV KIT  Comparison: The scan 06/17 the PET CT scan 11/06/2011  Findings: There is a focus of intense radiotracer activity within a  T5 vertebral body which corresponds to  a sclerotic lesion on  comparison CT exam. A second focus within the posterior aspect of  the left eleventh rib is also concerning for metastasis. No  additional evidence skeletal metastasis.  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   Impression and Plan: This is a pleasant 66 year old gentleman with the  following issues:  1. Prostate cancer. His diagnosis dates back to October of 2010. He had a prostate-specific antigen of 15, Gleason score of 5+ 4=9. He has developed castration-resistant disease. His scans discussed today. Although his PSA is up to 1.1, his scan show no change.  The plan for now is to give him a short period on antiandrogen withdrawal and we will start Zytiga if his PSA continue to go up despite this maneuver.    2. Bony disease. I do agree with Rivka Barbara under the care of Dr. Mena Goes.   3. Androgen deprivation. He is currently on Lupron 22.5 mg every 3 months. I will recommend continuing that for the time being.   4. Pain. His bony pain is under reasonable control with OxyContin and oxycodone. Would not really change  that at this time.  5. Follow up: in 6 weeks and repeat PSA then.    Marishka Rentfrow, MD 8/1/20133:10 PM

## 2012-01-01 ENCOUNTER — Other Ambulatory Visit: Payer: Self-pay | Admitting: Internal Medicine

## 2012-01-03 ENCOUNTER — Other Ambulatory Visit (HOSPITAL_BASED_OUTPATIENT_CLINIC_OR_DEPARTMENT_OTHER): Payer: Medicare Other | Admitting: Lab

## 2012-01-03 DIAGNOSIS — C61 Malignant neoplasm of prostate: Secondary | ICD-10-CM

## 2012-01-03 LAB — COMPREHENSIVE METABOLIC PANEL (CC13)
ALT: 46 U/L (ref 0–55)
Albumin: 3.7 g/dL (ref 3.5–5.0)
CO2: 22 mEq/L (ref 22–29)
Calcium: 9.5 mg/dL (ref 8.4–10.4)
Chloride: 107 mEq/L (ref 98–107)
Glucose: 105 mg/dl — ABNORMAL HIGH (ref 70–99)
Potassium: 4.4 mEq/L (ref 3.5–5.1)
Sodium: 140 mEq/L (ref 136–145)
Total Protein: 6.8 g/dL (ref 6.4–8.3)

## 2012-01-03 LAB — CBC WITH DIFFERENTIAL/PLATELET
Basophils Absolute: 0.1 10*3/uL (ref 0.0–0.1)
Eosinophils Absolute: 0.1 10*3/uL (ref 0.0–0.5)
HGB: 13.5 g/dL (ref 13.0–17.1)
MCV: 85.2 fL (ref 79.3–98.0)
MONO#: 0.9 10*3/uL (ref 0.1–0.9)
MONO%: 6.3 % (ref 0.0–14.0)
NEUT#: 10.1 10*3/uL — ABNORMAL HIGH (ref 1.5–6.5)
RBC: 4.92 10*6/uL (ref 4.20–5.82)
RDW: 14 % (ref 11.0–14.6)
WBC: 13.9 10*3/uL — ABNORMAL HIGH (ref 4.0–10.3)

## 2012-01-03 LAB — PSA: PSA: 3.05 ng/mL (ref <=4.00)

## 2012-01-07 ENCOUNTER — Telehealth: Payer: Self-pay | Admitting: Oncology

## 2012-01-07 ENCOUNTER — Ambulatory Visit (HOSPITAL_BASED_OUTPATIENT_CLINIC_OR_DEPARTMENT_OTHER): Payer: Medicare Other | Admitting: Oncology

## 2012-01-07 VITALS — BP 174/98 | HR 83 | Temp 98.5°F | Resp 20 | Ht 65.0 in | Wt 194.2 lb

## 2012-01-07 DIAGNOSIS — C61 Malignant neoplasm of prostate: Secondary | ICD-10-CM

## 2012-01-07 DIAGNOSIS — C7951 Secondary malignant neoplasm of bone: Secondary | ICD-10-CM

## 2012-01-07 DIAGNOSIS — R52 Pain, unspecified: Secondary | ICD-10-CM

## 2012-01-07 MED ORDER — ABIRATERONE ACETATE 250 MG PO TABS: 1000.0000 mg | ORAL_TABLET | Freq: Every day | ORAL | Status: DC

## 2012-01-07 NOTE — Telephone Encounter (Signed)
gv pt appt schedule for October.  °

## 2012-01-07 NOTE — Progress Notes (Signed)
Hematology and Oncology Follow Up Visit  JOREN REHM [id number] 1945/12/05 66 y.o. 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  Gleason score 5+4=9 in 5 out of the 8 cores, PSA 15.  Prior Therapy: He was started on Lupron and Casodex for his prostate cancer. His PSA nadir down to 0.02 back in March of 2012. In July of 2012, his PSA was up to 0.6. Casodex was stopped temporarily up until November of 2012. His PSA went up to 0.09. Casodex was restarted  in November of 2012. In March of 2013, his PSA was 0.27 and most recently, his PSA was up to 0.76 with a castrate level of testosterone of 17.7.  Current therapy: Lupron,  and monthly Xgeva given  at [facility] urology.  Interim History: 66 year old presents today for a follow up visit. Since his last visit, He is still very functional, is able to drive, perform most activities of daily living. His urine flow is reasonable with the help of Flomax, without it he has had some urine stream difficulties, but otherwise he had not had any weight loss, has not had any appetite changes, has not had any deterioration in his quality of life. He developed a UTI and currently on Cipro. He reports that Xgeva have caused joint pains at times.    Medications: I have reviewed the patient's current medications. Current outpatient prescriptions:amLODipine (NORVASC) 10 MG tablet, TAKE 1 TABLET EVERY DAY, Disp: 30 tablet, Rfl: 5;  atenolol (TENORMIN) 50 MG tablet, TAKE 1.5 TABLETS (75 MG TOTAL) BY MOUTH DAILY., Disp: 45 tablet, Rfl: 5;  bicalutamide (CASODEX) 50 MG tablet, Take 1 by mouth daily, Disp: , Rfl: ;  Calcium Carbonate-Vitamin D (CALCIUM 600+D) 600-400 MG-UNIT per tablet, Take 1 tablet by mouth daily.  , Disp: , Rfl:  ciprofloxacin (CIPRO) 100 MG tablet, Take 100 mg by mouth 2 (two) times daily., Disp: , Rfl: ;  Denosumab (XGEVA [place]), Inject 120  mg into the skin every 30 (thirty) days. Patient receives injection at urologists office, Disp: , Rfl: ;  docusate sodium (COLACE) 100 MG capsule, Take 100 mg by mouth daily.  , Disp: , Rfl: ;  oxycodone (OXY-IR) 5 MG capsule, Take 10 mg by mouth every 4 (four) hours as needed., Disp: , Rfl:  oxyCODONE (OXYCONTIN) 20 MG 12 hr tablet, Take 20 mg by mouth every 12 (twelve) hours.  , Disp: , Rfl: ;  Tamsulosin HCl (FLOMAX) 0.4 MG CAPS, Take 0.4 mg by mouth every other day.  , Disp: , Rfl: ;  abiraterone Acetate (ZYTIGA) 250 MG tablet, Take 4 tablets (1,000 mg total) by mouth daily. Take on an empty stomach 1 hour before or 2 hours after a meal, Disp: 120 tablet, Rfl: 0  Allergies: No Known Allergies  Past Medical History, Surgical history, Social history, and Family History were reviewed and updated.  Review of Systems: Constitutional:  Negative for fever, chills, night sweats, anorexia, weight loss, pain. Cardiovascular: no chest pain or dyspnea on exertion Respiratory: negative Neurological: negative Dermatological: negative ENT: negative Skin: Negative. Gastrointestinal: negative Genito-Urinary: negative Hematological and Lymphatic: negative Breast: negative Musculoskeletal: negative Remaining ROS negative. Physical Exam: Blood pressure 174/98, pulse 83, temperature 98.5 F (36.9 C), temperature source Oral, resp. rate 20, height 5\' 5"  (1.651 m), weight 194 lb 3.2 oz (88.089 kg). ECOG: 1 General appearance: alert Head: Normocephalic, without obvious abnormality, atraumatic Neck: no  adenopathy, no carotid bruit, no JVD, supple, symmetrical, trachea midline and thyroid not enlarged, symmetric, no tenderness/mass/nodules Lymph nodes: Cervical, supraclavicular, and axillary nodes normal. Heart:regular rate and rhythm, S1, S2 normal, no murmur, click, rub or gallop Lung:chest clear, no wheezing, rales, normal symmetric air entry Abdomin: soft, non-tender, without masses or  organomegaly EXT:no erythema, induration, or nodules   Lab Results: Lab Results  Component Value Date   WBC 13.9* 01/03/2012   HGB 13.5 01/03/2012   HCT 41.9 01/03/2012   MCV 85.2 01/03/2012   PLT 298 01/03/2012     Chemistry      Component Value Date/Time   NA 140 01/03/2012 1349   NA 140 11/01/2011 1325   K 4.4 01/03/2012 1349   K 3.9 11/01/2011 1325   CL 107 01/03/2012 1349   CL 104 11/01/2011 1325   CO2 22 01/03/2012 1349   CO2 28 11/01/2011 1325   BUN 13.0 01/03/2012 1349   BUN 9 11/01/2011 1325   CREATININE 0.7 01/03/2012 1349   CREATININE 0.60 11/01/2011 1325      Component Value Date/Time   CALCIUM 9.5 01/03/2012 1349   CALCIUM 9.1 11/01/2011 1325   ALKPHOS 73 01/03/2012 1349   ALKPHOS 101 11/01/2011 1325   AST 38* 01/03/2012 1349   AST 34 11/01/2011 1325   ALT 46 01/03/2012 1349   ALT 37 11/01/2011 1325   BILITOT 0.60 01/03/2012 1349   BILITOT 0.4 11/01/2011 1325      Results for RODRIGUS, KILKER (MRN [id number]) as of 01/07/2012 13:51  Ref. Range 11/01/2011 13:25 01/03/2012 13:49  PSA Latest Range: <=4.00 ng/mL 1.10 3.05     Impression and Plan: This is a pleasant 66 year old gentleman with the  following issues:  1. Prostate cancer. His diagnosis dates back to October of 2010. He had a prostate-specific antigen of 15, Gleason score of 5+ 4=9. He has developed castration-resistant disease. His PSA is up to 3.05 despite antiandrogen withdrawal. He continued to have minimally symptomatic bone disease.  I addressed with him again the different treatment options include Zytiga, chemotherapy and provenge immune therapy.  He agreed with Roosvelt Maser and we will start ASAP.   2. Bony disease. I do agree with Rivka Barbara under the care of Dr. Mena Goes.   3. Androgen deprivation. He is currently on Lupron 22.5 mg every 3 months. I will recommend continuing that for the time being.   4. Pain. His bony pain is under reasonable control with OxyContin and oxycodone. Would not really change  that at this  time.  5. Follow up: in 4 weeks and repeat PSA then and assess any complications of Zytiga.    Eli Hose, MD 9/17/20131:50 PM

## 2012-01-08 ENCOUNTER — Encounter: Payer: Self-pay | Admitting: Oncology

## 2012-01-08 NOTE — Progress Notes (Signed)
[employer], [id number], approved zytiga 250mg  from 01/08/12-01/07/13 [id number].

## 2012-01-14 ENCOUNTER — Encounter: Payer: Self-pay | Admitting: Oncology

## 2012-01-14 NOTE — Progress Notes (Signed)
Patient is approved for copay assistance through [employer] from 01/09/12-01/08/13.  Called [facility] to give them his card information . Retail card # [id number] Group # [id number] [facility] # [id number]

## 2012-01-23 ENCOUNTER — Encounter: Payer: Self-pay | Admitting: Internal Medicine

## 2012-01-23 ENCOUNTER — Ambulatory Visit (INDEPENDENT_AMBULATORY_CARE_PROVIDER_SITE_OTHER): Payer: Medicare Other | Admitting: Internal Medicine

## 2012-01-23 VITALS — BP 130/82 | HR 83 | Temp 98.2°F | Ht 62.0 in | Wt 195.0 lb

## 2012-01-23 DIAGNOSIS — B370 Candidal stomatitis: Secondary | ICD-10-CM

## 2012-01-23 DIAGNOSIS — Z23 Encounter for immunization: Secondary | ICD-10-CM

## 2012-01-23 DIAGNOSIS — C61 Malignant neoplasm of prostate: Secondary | ICD-10-CM

## 2012-01-23 DIAGNOSIS — I10 Essential (primary) hypertension: Secondary | ICD-10-CM

## 2012-01-23 DIAGNOSIS — D239 Other benign neoplasm of skin, unspecified: Secondary | ICD-10-CM

## 2012-01-23 DIAGNOSIS — D229 Melanocytic nevi, unspecified: Secondary | ICD-10-CM

## 2012-01-23 MED ORDER — NYSTATIN 100000 UNIT/ML MT SUSP: 500000.0000 [IU] | Freq: Four times a day (QID) | OROMUCOSAL | Status: DC

## 2012-01-23 MED ORDER — FLUCONAZOLE 150 MG PO TABS: 150.0000 mg | ORAL_TABLET | Freq: Once | ORAL | Status: DC

## 2012-01-23 NOTE — Progress Notes (Signed)
Subjective:    Patient ID: Jeff Jimenez, male    DOB: 12-31-1945, 66 y.o.   MRN: [id number]  Sore Throat  Pertinent negatives include no coughing, headaches or shortness of breath.   here for 6 month followup - reviewed chronic medical issues:  HTN - home BP reviewed: range SBP 130-150s on amlodipine 10mg  once daily with atenolol- BP readings exac by anxiety and pain - no side effects related to med tx  - no edema,chest pain or headache   met prostate cancer, dx 2010 reports increasing PSA- reviewed interval oncology and urology notes - s/p XRT to spine mets and 2010 - only pain mgmt by uro - overall controlled Also on Lupron every 6 months, changed Casodex to [facility] 12/2011 walking with walker only as needed (long exertion out of house)  anxiety - uses as needed valium, no increase in dose or frequency reports compliance with ongoing medical treatment and no changes in medication dose or frequency. denies adverse side effects related to current therapy.   Past Medical History  Diagnosis Date  . Shingles 06/2009  . BPH (benign prostatic hyperplasia)   . DIVERTICULITIS, HX OF 2010  . ANEMIA-NOS   . ANXIETY   . HYPERTENSION   . 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 Negative for fever.  HENT: Positive for sore throat and mouth sores.   Respiratory: Negative for cough and shortness of breath.   Cardiovascular: Negative for chest pain and palpitations.  Neurological: Negative for headaches.       Objective:   Physical Exam  BP 130/82  Pulse 83  Temp 98.2 F (36.8 C) (Oral)  Ht 5\' 2"  (1.575 m)  Wt 195 lb (88.451 kg)  BMI 35.67 kg/m2  SpO2 95% Wt Readings from Last 3 Encounters:  01/23/12 195 lb (88.451 kg)  01/07/12 194 lb 3.2 oz (88.089 kg)  11/21/11 195 lb 8 oz (88.678 kg)   Constitutional: He is well-developed and well-nourished. No  distress.  Overweight.   HENT: Oropharynx with patchy thrush -tong and buccal mucosa are spared -TMs clear bilaterally without effusion or erythema. Sinuses nontender. Cardiovascular: Normal rate, regular rhythm and normal heart sounds. No murmur heard. no BLE edema Pulmonary/Chest: Effort normal and breath sounds normal. No respiratory distress. He has no wheezes.  Musculoskeletal:  Back: full range of motion of thoracic and lumbar spine. Non tender to palpation. Sensation intact in all dermatomes of the lower extremities. Full strength to manual muscle testing. the patient is able to heel toe walk without difficulty and ambulates with a normal gait. Skin - R anterior thigh with 1 cm diameter pink papule with raised annular at age -no surrounding erythema, ulceration  Neurological: He is alert and oriented to person, place, and time. Coordination normal.   Lab Results  Component Value Date   WBC 13.9* 01/03/2012   HGB 13.5 01/03/2012   HCT 41.9 01/03/2012   PLT 298 01/03/2012   GLUCOSE 105* 01/03/2012   CHOL 171 01/30/2011   TRIG 220.0* 01/30/2011   HDL 38.20* 01/30/2011   LDLDIRECT 98.3 01/30/2011   LDLCALC 112* 01/31/2010   ALT 46 01/03/2012   AST 38* 01/03/2012   NA 140 01/03/2012   K 4.4 01/03/2012   CL 107 01/03/2012   CREATININE 0.7 01/03/2012   BUN 13.0 01/03/2012   CO2 22 01/03/2012   TSH 1.44 01/30/2011  PSA 3.05 01/03/2012   INR 1.16 01/28/2009   HGBA1C  Value: 6.0 (NOTE) The ADA recommends the following therapeutic goal for glycemic control related to Hgb A1c measurement: Goal of therapy: <6.5 Hgb A1c  Reference: American Diabetes Association: Clinical Practice Recommendations 2010, Diabetes Care, 2010, 33: (Suppl  1). 01/30/2009        Assessment & Plan:  See problem list. Medications and labs reviewed today.  Oral thrush - likely precipitated by chemo tx and recent antibiotics Cipro and doxy for UTI thru uro -now complete antibiotics. Treat with Diflucan and nystatin swish and  swallow -education the patient provided  Right thigh skin lesion - 1cm raised edge pink nodule - no significant change in size, periodically itchy which responds to hydrocortisone, but has not resolved - also prior trial antifungal ineffective - refer to derm to consider bx or excision

## 2012-01-23 NOTE — Assessment & Plan Note (Signed)
Initial dx 2010 - status post XRT to T+L spine mets ongoing Lupron every 3 mo and casodex resumed 03/2011 Added zytiga 12/2011 by onc due to development of castration resistant disease Follows with uro [place] and oncologist Shadad for same q3-34mo - reviewed slightly rising PSA prompting change in therapy Bony met pain control with OxyContin per uro - reviewed last OV and current narcotic dosage The current medical regimen is effective;  continue present plan and medications.

## 2012-01-23 NOTE — Assessment & Plan Note (Signed)
BP Readings from Last 3 Encounters:  01/23/12 130/82  01/07/12 174/98  11/21/11 147/91  subop control at home 07/2011 increased Bbloc, improved The current medical regimen is effective;  continue present plan and medications.

## 2012-01-23 NOTE — Patient Instructions (Signed)
It was good to see you today. We have reviewed your interval records including labs and tests today use Diflucan daily for 5 days and nystatin swish and swallow to treat your thrush -Your prescription(s) have been submitted to your pharmacy. Please take as directed and contact our office if you believe you are having problem(s) with the medication(s). we'll make referral to dermatology for your skin spot. Our office will contact you regarding appointment(s) once made. Medications reviewed, no changes at this time. Please schedule followup in 6 months, call sooner if problems.

## 2012-01-27 ENCOUNTER — Other Ambulatory Visit: Payer: Self-pay | Admitting: Internal Medicine

## 2012-01-29 ENCOUNTER — Ambulatory Visit: Payer: Medicare Other | Admitting: Internal Medicine

## 2012-02-07 ENCOUNTER — Other Ambulatory Visit (HOSPITAL_BASED_OUTPATIENT_CLINIC_OR_DEPARTMENT_OTHER): Payer: Medicare Other | Admitting: Lab

## 2012-02-07 DIAGNOSIS — C61 Malignant neoplasm of prostate: Secondary | ICD-10-CM

## 2012-02-07 LAB — CBC WITH DIFFERENTIAL/PLATELET
BASO%: 0.8 % (ref 0.0–2.0)
Eosinophils Absolute: 0.4 10*3/uL (ref 0.0–0.5)
LYMPH%: 26.6 % (ref 14.0–49.0)
MCHC: 32.9 g/dL (ref 32.0–36.0)
MONO#: 0.6 10*3/uL (ref 0.1–0.9)
NEUT#: 6.3 10*3/uL (ref 1.5–6.5)
Platelets: 317 10*3/uL (ref 140–400)
RBC: 4.76 10*6/uL (ref 4.20–5.82)
RDW: 14.4 % (ref 11.0–14.6)
WBC: 10.1 10*3/uL (ref 4.0–10.3)

## 2012-02-07 LAB — COMPREHENSIVE METABOLIC PANEL (CC13)
ALT: 26 U/L (ref 0–55)
Albumin: 3.6 g/dL (ref 3.5–5.0)
Alkaline Phosphatase: 106 U/L (ref 40–150)
CO2: 23 mEq/L (ref 22–29)
Glucose: 124 mg/dl — ABNORMAL HIGH (ref 70–99)
Potassium: 3.3 mEq/L — ABNORMAL LOW (ref 3.5–5.1)
Sodium: 139 mEq/L (ref 136–145)
Total Bilirubin: 0.5 mg/dL (ref 0.20–1.20)
Total Protein: 6.5 g/dL (ref 6.4–8.3)

## 2012-02-07 LAB — PSA: PSA: 5.17 ng/mL — ABNORMAL HIGH (ref <=4.00)

## 2012-02-11 ENCOUNTER — Ambulatory Visit: Payer: Medicare Other | Admitting: Oncology

## 2012-02-11 ENCOUNTER — Telehealth: Payer: Self-pay | Admitting: Oncology

## 2012-02-11 VITALS — BP 175/97 | HR 87 | Temp 99.1°F | Resp 20 | Ht 62.0 in | Wt 191.9 lb

## 2012-02-11 DIAGNOSIS — C61 Malignant neoplasm of prostate: Secondary | ICD-10-CM

## 2012-02-11 MED ORDER — ABIRATERONE ACETATE 250 MG PO TABS: 1000.0000 mg | ORAL_TABLET | Freq: Every day | ORAL | Status: DC

## 2012-02-11 NOTE — Telephone Encounter (Signed)
Gave pt appt for November 2013 lab before MD

## 2012-02-11 NOTE — Progress Notes (Signed)
Hematology and Oncology Follow Up Visit  Jeff Jimenez [id number] [birth year] 66 y.o. 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  Gleason score 5+4=9 in 5 out of the 8 cores, PSA 15.  Prior Therapy: He was started on Lupron and Casodex for his prostate cancer. His PSA nadir down to 0.02 back in March of 2012. In July of 2012, his PSA was up to 0.6. Casodex was stopped temporarily up until November of 2012. His PSA went up to 0.09. Casodex was restarted  in November of 2012. In March of 2013, his PSA was 0.27 and most recently, his PSA was up to 0.76 with a castrate level of testosterone of 17.7.  Current therapy: Lupron,  and monthly Xgeva given  at [facility] urology. Zytiga started on 01/15/2012   Interim History: 66 year old presents today for a follow up visit. Since his last visit, he has started [place] and reports no complications with it. He did report having a UTI and some complications related to antibiotics that caused him thrush. He id doing a lot better at this time. He is still very functional, is able to drive, perform most activities of daily living. His urine flow is reasonable with the help of Flomax, without it he has had some urine stream difficulties, but otherwise he had not had any weight loss, has not had any appetite changes, has not had any deterioration in his quality of life.    Medications: I have reviewed the patient's current medications. Current outpatient prescriptions:abiraterone Acetate (ZYTIGA) 250 MG tablet, Take 4 tablets (1,000 mg total) by mouth daily. Take on an empty stomach 1 hour before or 2 hours after a meal, Disp: 120 tablet, Rfl: 1;  amLODipine (NORVASC) 10 MG tablet, TAKE 1 TABLET EVERY DAY, Disp: 30 tablet, Rfl: 5;  atenolol (TENORMIN) 50 MG tablet, TAKE 1.5 TABLETS (75 MG TOTAL) BY MOUTH DAILY., Disp: 45 tablet, Rfl: 5 Calcium  Carbonate-Vitamin D (CALCIUM 600+D) 600-400 MG-UNIT per tablet, Take 2 tablets by mouth daily. , Disp: , Rfl: ;  Denosumab (XGEVA [place]), Inject 120 mg into the skin every 30 (thirty) days. Patient receives injection at urologists office, Disp: , Rfl: ;  docusate sodium (COLACE) 100 MG capsule, Take 300 mg by mouth daily. , Disp: , Rfl:  leuprolide (LUPRON DEPOT) 22.5 MG injection, Inject 22.5 mg into the muscle every 3 (three) months. Or as directed by urology, Disp: 1 each, Rfl: 0;  oxycodone (OXY-IR) 5 MG capsule, Take 10 mg by mouth every 4 (four) hours as needed., Disp: , Rfl: ;  oxyCODONE (OXYCONTIN) 20 MG 12 hr tablet, Take 20 mg by mouth every 12 (twelve) hours.  , Disp: , Rfl: ;  Tamsulosin HCl (FLOMAX) 0.4 MG CAPS, Take 0.4 mg by mouth daily. , Disp: , Rfl:  DISCONTD: abiraterone Acetate (ZYTIGA) 250 MG tablet, Take 4 tablets (1,000 mg total) by mouth daily. Take on an empty stomach 1 hour before or 2 hours after a meal, Disp: 120 tablet, Rfl: 0  Allergies: No Known Allergies  Past Medical History, Surgical history, Social history, and Family History were reviewed and updated.  Review of Systems: Constitutional:  Negative for fever, chills, night sweats, anorexia, weight loss, pain. Cardiovascular: no chest pain or dyspnea on exertion Respiratory: negative Neurological: negative Dermatological: negative ENT: negative Skin: Negative. Gastrointestinal: negative Genito-Urinary: negative Hematological and Lymphatic: negative Breast: negative Musculoskeletal: negative Remaining  ROS negative. Physical Exam: Blood pressure 175/97, pulse 87, temperature 99.1 F (37.3 C), temperature source Oral, resp. rate 20, height 5\' 2"  (1.575 m), weight 191 lb 14.4 oz (87.045 kg). ECOG: 1 General appearance: alert Head: Normocephalic, without obvious abnormality, atraumatic Neck: no adenopathy, no carotid bruit, no JVD, supple, symmetrical, trachea midline and thyroid not enlarged, symmetric, no  tenderness/mass/nodules Lymph nodes: Cervical, supraclavicular, and axillary nodes normal. Heart:regular rate and rhythm, S1, S2 normal, no murmur, click, rub or gallop Lung:chest clear, no wheezing, rales, normal symmetric air entry Abdomin: soft, non-tender, without masses or organomegaly EXT:no erythema, induration, or nodules   Lab Results: Lab Results  Component Value Date   WBC 10.1 02/07/2012   HGB 13.4 02/07/2012   HCT 40.7 02/07/2012   MCV 85.6 02/07/2012   PLT 317 02/07/2012     Chemistry      Component Value Date/Time   NA 139 02/07/2012 1402   NA 140 11/01/2011 1325   K 3.3* 02/07/2012 1402   K 3.9 11/01/2011 1325   CL 106 02/07/2012 1402   CL 104 11/01/2011 1325   CO2 23 02/07/2012 1402   CO2 28 11/01/2011 1325   BUN 7.0 02/07/2012 1402   BUN 9 11/01/2011 1325   CREATININE 0.6* 02/07/2012 1402   CREATININE 0.60 11/01/2011 1325      Component Value Date/Time   CALCIUM 8.9 02/07/2012 1402   CALCIUM 9.1 11/01/2011 1325   ALKPHOS 106 02/07/2012 1402   ALKPHOS 101 11/01/2011 1325   AST 35* 02/07/2012 1402   AST 34 11/01/2011 1325   ALT 26 02/07/2012 1402   ALT 37 11/01/2011 1325   BILITOT 0.50 02/07/2012 1402   BILITOT 0.4 11/01/2011 1325      Results for TERRAIL, ZWEIFEL (MRN [id number]) as of 02/11/2012 11:57  Ref. Range 01/03/2012 13:49 02/07/2012 14:02  PSA Latest Range: <=4.00 ng/mL 3.05 5.17 (H)      Impression and Plan: This is a pleasant 66 year old gentleman with the  following issues:  1. Prostate cancer. His diagnosis dates back to October of 2010. He had a prostate-specific antigen of 15, Gleason score of 5+ 4=9. He has developed castration-resistant disease. His PSA is up to 3.05 despite antiandrogen withdrawal. He continued to have minimally symptomatic bone disease.  He started [place] last month. He has done well at this time but his PSA did go up to 5.  The plan is to continue Zytiga for at least 2 more months.   2. Bony disease. I do agree with  Rivka Barbara under the care of Dr. Mena Goes.   3. Androgen deprivation. He is currently on Lupron 22.5 mg every 3 months. I will recommend continuing that for the time being.   4. Pain. His bony pain is under reasonable control with OxyContin and oxycodone. Would not really change  that at this time.  5. Follow up: in 4 weeks and repeat PSA then and assess any complications of Zytiga.    Jermaine Tholl, MD 10/22/201312:13 PM

## 2012-02-26 ENCOUNTER — Telehealth: Payer: Self-pay | Admitting: *Deleted

## 2012-02-26 NOTE — Telephone Encounter (Signed)
Patient called with c/o bright red blood in stool, occurring x4 episodes on 02/24/2012.  Patient is taking narcotic pain medication that causes issues with constipation; patient states he must strain heavily to make a BM.  Advised patient this is likely caused by hemorrhoids, advised to take Miralax/Colace daily with increase water intake.  If blood persists, contact this office and if patient is tender, use OTC cream for hemorrhoids PRN.  If patient goes beyond 3 days without BM, contact this office.

## 2012-03-13 ENCOUNTER — Other Ambulatory Visit (HOSPITAL_BASED_OUTPATIENT_CLINIC_OR_DEPARTMENT_OTHER): Payer: Medicare Other

## 2012-03-13 DIAGNOSIS — C61 Malignant neoplasm of prostate: Secondary | ICD-10-CM

## 2012-03-13 LAB — COMPREHENSIVE METABOLIC PANEL (CC13)
ALT: 27 U/L (ref 0–55)
AST: 42 U/L — ABNORMAL HIGH (ref 5–34)
Alkaline Phosphatase: 609 U/L — ABNORMAL HIGH (ref 40–150)
Creatinine: 0.6 mg/dL — ABNORMAL LOW (ref 0.7–1.3)
Sodium: 140 mEq/L (ref 136–145)
Total Bilirubin: 0.42 mg/dL (ref 0.20–1.20)

## 2012-03-13 LAB — CBC WITH DIFFERENTIAL/PLATELET
BASO%: 1 % (ref 0.0–2.0)
EOS%: 5.1 % (ref 0.0–7.0)
HCT: 40.3 % (ref 38.4–49.9)
LYMPH%: 27.2 % (ref 14.0–49.0)
MCH: 28.5 pg (ref 27.2–33.4)
MCHC: 33.6 g/dL (ref 32.0–36.0)
NEUT%: 59.5 % (ref 39.0–75.0)
Platelets: 319 10*3/uL (ref 140–400)
RBC: 4.74 10*6/uL (ref 4.20–5.82)

## 2012-03-17 ENCOUNTER — Telehealth: Payer: Self-pay | Admitting: Oncology

## 2012-03-17 ENCOUNTER — Ambulatory Visit (HOSPITAL_BASED_OUTPATIENT_CLINIC_OR_DEPARTMENT_OTHER): Payer: Medicare Other | Admitting: Oncology

## 2012-03-17 VITALS — BP 163/88 | HR 79 | Temp 98.7°F | Resp 18 | Ht 62.0 in | Wt 189.5 lb

## 2012-03-17 DIAGNOSIS — E291 Testicular hypofunction: Secondary | ICD-10-CM

## 2012-03-17 DIAGNOSIS — C61 Malignant neoplasm of prostate: Secondary | ICD-10-CM

## 2012-03-17 DIAGNOSIS — C7951 Secondary malignant neoplasm of bone: Secondary | ICD-10-CM

## 2012-03-17 NOTE — Telephone Encounter (Signed)
appts made and printed for pt aom °

## 2012-03-17 NOTE — Progress Notes (Signed)
Hematology and Oncology Follow Up Visit  Jeff Jimenez [id number] June 14, 1945 66 y.o. 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  Gleason score 5+4=9 in 5 out of the 8 cores, PSA 15.  Prior Therapy: He was started on Lupron and Casodex for his prostate cancer. His PSA nadir down to 0.02 back in March of 2012. In July of 2012, his PSA was up to 0.6. Casodex was stopped temporarily up until November of 2012. His PSA went up to 0.09. Casodex was restarted  in November of 2012. In March of 2013, his PSA was 0.27 and most recently, his PSA was up to 0.76 with a castrate level of testosterone of 17.7.  Current therapy: Lupron,  and monthly Xgeva given  at [facility] urology. Zytiga started on 01/15/2012   Interim History: 66 year old presents today for a follow up visit. Since his last visit, he has tolerated Zytiga and reports no complications with it. He did report having a URI symptoms that are improving. He is doing a lot better at this time. He is still very functional, is able to drive, perform most activities of daily living. His urine flow is reasonable with the help of Flomax, without it he has had some urine stream difficulties, but otherwise he had not had any weight loss, has not had any appetite changes, has not had any deterioration in his quality of life. No increase in his bone pain. No new complications related to Zytiga. He reports taking it as prescribed.    Medications: I have reviewed the patient's current medications. Current outpatient prescriptions:abiraterone Acetate (ZYTIGA) 250 MG tablet, Take 4 tablets (1,000 mg total) by mouth daily. Take on an empty stomach 1 hour before or 2 hours after a meal, Disp: 120 tablet, Rfl: 1;  amLODipine (NORVASC) 10 MG tablet, TAKE 1 TABLET EVERY DAY, Disp: 30 tablet, Rfl: 5;  atenolol (TENORMIN) 50 MG tablet, TAKE 1.5 TABLETS (75  MG TOTAL) BY MOUTH DAILY., Disp: 45 tablet, Rfl: 5 Calcium Carbonate-Vitamin D (CALCIUM 600+D) 600-400 MG-UNIT per tablet, Take 2 tablets by mouth daily. , Disp: , Rfl: ;  Denosumab (XGEVA [place]), Inject 120 mg into the skin every 30 (thirty) days. Patient receives injection at urologists office, Disp: , Rfl: ;  docusate sodium (COLACE) 100 MG capsule, Take 300 mg by mouth daily. , Disp: , Rfl:  leuprolide (LUPRON DEPOT) 22.5 MG injection, Inject 22.5 mg into the muscle every 3 (three) months. Or as directed by urology, Disp: 1 each, Rfl: 0;  oxycodone (OXY-IR) 5 MG capsule, Take 10 mg by mouth every 4 (four) hours as needed., Disp: , Rfl: ;  oxyCODONE (OXYCONTIN) 20 MG 12 hr tablet, Take 20 mg by mouth every 12 (twelve) hours.  , Disp: , Rfl: ;  Tamsulosin HCl (FLOMAX) 0.4 MG CAPS, Take 0.4 mg by mouth daily. , Disp: , Rfl:   Allergies: No Known Allergies  Past Medical History, Surgical history, Social history, and Family History were reviewed and updated.  Review of Systems: Constitutional:  Negative for fever, chills, night sweats, anorexia, weight loss, pain. Cardiovascular: no chest pain or dyspnea on exertion Respiratory: negative Neurological: negative Dermatological: negative ENT: negative Skin: Negative. Gastrointestinal: negative Genito-Urinary: negative Hematological and Lymphatic: negative Breast: negative Musculoskeletal: negative Remaining ROS negative. Physical Exam: Blood pressure 163/88, pulse 79, temperature 98.7 F (37.1 C), temperature source Oral, resp. rate 18, height 5\' 2"  (  1.575 m), weight 189 lb 8 oz (85.957 kg). ECOG: 1 General appearance: alert Head: Normocephalic, without obvious abnormality, atraumatic Neck: no adenopathy, no carotid bruit, no JVD, supple, symmetrical, trachea midline and thyroid not enlarged, symmetric, no tenderness/mass/nodules Lymph nodes: Cervical, supraclavicular, and axillary nodes normal. Heart:regular rate and rhythm, S1, S2 normal, no  murmur, click, rub or gallop Lung:chest clear, no wheezing, rales, normal symmetric air entry Abdomin: soft, non-tender, without masses or organomegaly EXT:no erythema, induration, or nodules   Lab Results: Lab Results  Component Value Date   WBC 10.6* 03/13/2012   HGB 13.5 03/13/2012   HCT 40.3 03/13/2012   MCV 84.9 03/13/2012   PLT 319 03/13/2012     Chemistry      Component Value Date/Time   NA 140 03/13/2012 0857   NA 140 11/01/2011 1325   K 3.8 03/13/2012 0857   K 3.9 11/01/2011 1325   CL 106 03/13/2012 0857   CL 104 11/01/2011 1325   CO2 27 03/13/2012 0857   CO2 28 11/01/2011 1325   BUN 8.0 03/13/2012 0857   BUN 9 11/01/2011 1325   CREATININE 0.6* 03/13/2012 0857   CREATININE 0.60 11/01/2011 1325      Component Value Date/Time   CALCIUM 9.1 03/13/2012 0857   CALCIUM 9.1 11/01/2011 1325   ALKPHOS 609 Repeated and Verified* 03/13/2012 0857   ALKPHOS 101 11/01/2011 1325   AST 42* 03/13/2012 0857   AST 34 11/01/2011 1325   ALT 27 03/13/2012 0857   ALT 37 11/01/2011 1325   BILITOT 0.42 03/13/2012 0857   BILITOT 0.4 11/01/2011 1325      Results for Jeff, Jimenez (MRN [id number]) as of 03/17/2012 09:02  Ref. Range 02/07/2012 14:02 03/13/2012 08:57  PSA Latest Range: <=4.00 ng/mL 5.17 (H) 16.85 (H)       Impression and Plan: This is a pleasant 66 year old gentleman with the  following issues:  1. Prostate cancer. His diagnosis dates back to October of 2010. He had a prostate-specific antigen of 15, Gleason score of 5+ 4=9. He has developed castration-resistant disease. His PSA is up to 3.05 despite antiandrogen withdrawal. He continued to have minimally symptomatic bone disease.  He started Zytiga for the last 2 months. He has done well at this time but his PSA did go up to 16.85.  The plan is to continue Zytiga for at least 1 more month. If his PSA continues to rise, then I will switch him to a different agent such as Taxotere or Xtandi.    2. Bony disease. I do agree  with Rivka Barbara under the care of Dr. Mena Goes.   3. Androgen deprivation. He is currently on Lupron 22.5 mg every 3 months. I will recommend continuing that for the time being.   4. Pain. His bony pain is under reasonable control with OxyContin and oxycodone. Would not really change  that at this time.  5. Follow up: in 4 weeks and repeat PSA then and assess any complications of Zytiga.    [facility], MD 11/26/20139:41 AM

## 2012-04-07 ENCOUNTER — Other Ambulatory Visit: Payer: Self-pay | Admitting: Oncology

## 2012-04-20 ENCOUNTER — Other Ambulatory Visit (HOSPITAL_BASED_OUTPATIENT_CLINIC_OR_DEPARTMENT_OTHER): Payer: Medicare Other

## 2012-04-20 DIAGNOSIS — C61 Malignant neoplasm of prostate: Secondary | ICD-10-CM

## 2012-04-20 LAB — COMPREHENSIVE METABOLIC PANEL (CC13)
AST: 26 U/L (ref 5–34)
BUN: 5 mg/dL — ABNORMAL LOW (ref 7.0–26.0)
Calcium: 7.9 mg/dL — ABNORMAL LOW (ref 8.4–10.4)
Chloride: 104 mEq/L (ref 98–107)
Creatinine: 0.5 mg/dL — ABNORMAL LOW (ref 0.7–1.3)

## 2012-04-20 LAB — CBC WITH DIFFERENTIAL/PLATELET
Basophils Absolute: 0.1 10*3/uL (ref 0.0–0.1)
EOS%: 3.9 % (ref 0.0–7.0)
HCT: 36.5 % — ABNORMAL LOW (ref 38.4–49.9)
HGB: 12.3 g/dL — ABNORMAL LOW (ref 13.0–17.1)
LYMPH%: 23.1 % (ref 14.0–49.0)
MCH: 27.8 pg (ref 27.2–33.4)
MCV: 82.7 fL (ref 79.3–98.0)
MONO%: 6.5 % (ref 0.0–14.0)
NEUT%: 65.8 % (ref 39.0–75.0)
Platelets: 320 10*3/uL (ref 140–400)
lymph#: 2 10*3/uL (ref 0.9–3.3)

## 2012-04-20 LAB — PSA: PSA: 49.63 ng/mL — ABNORMAL HIGH (ref <=4.00)

## 2012-04-21 ENCOUNTER — Telehealth: Payer: Self-pay | Admitting: Oncology

## 2012-04-21 ENCOUNTER — Ambulatory Visit (HOSPITAL_BASED_OUTPATIENT_CLINIC_OR_DEPARTMENT_OTHER): Payer: Medicare Other | Admitting: Oncology

## 2012-04-21 VITALS — BP 148/79 | HR 81 | Temp 97.0°F | Resp 18 | Ht 62.0 in | Wt 182.2 lb

## 2012-04-21 DIAGNOSIS — E291 Testicular hypofunction: Secondary | ICD-10-CM

## 2012-04-21 DIAGNOSIS — E876 Hypokalemia: Secondary | ICD-10-CM

## 2012-04-21 DIAGNOSIS — C7951 Secondary malignant neoplasm of bone: Secondary | ICD-10-CM

## 2012-04-21 DIAGNOSIS — C61 Malignant neoplasm of prostate: Secondary | ICD-10-CM

## 2012-04-21 MED ORDER — ONDANSETRON HCL 8 MG PO TABS: 8.0000 mg | ORAL_TABLET | Freq: Three times a day (TID) | ORAL | Status: DC | PRN

## 2012-04-21 MED ORDER — POTASSIUM CHLORIDE CRYS ER 20 MEQ PO TBCR: 20.0000 meq | EXTENDED_RELEASE_TABLET | Freq: Every day | ORAL | Status: DC

## 2012-04-21 MED ORDER — PREDNISONE 5 MG PO TABS: 5.0000 mg | ORAL_TABLET | Freq: Every day | ORAL | Status: DC

## 2012-04-21 NOTE — Telephone Encounter (Signed)
appts made and printed for pt aom °

## 2012-04-21 NOTE — Progress Notes (Signed)
Hematology and Oncology Follow Up Visit  Jeff Jimenez [id number] [birth year] 66 y.o. 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  Gleason score 5+4=9 in 5 out of the 8 cores, PSA 15.  Prior Therapy: He was started on Lupron and Casodex for his prostate cancer. His PSA nadir down to 0.02 back in March of 2012. In July of 2012, his PSA was up to 0.6. Casodex was stopped temporarily up until November of 2012. His PSA went up to 0.09. Casodex was restarted  in November of 2012. In March of 2013, his PSA was 0.27 and most recently, his PSA was up to 0.76 with a castrate level of testosterone of 17.7.  Current therapy: Lupron,  and monthly Xgeva given  at [facility] urology. Zytiga started on 01/15/2012   Interim History: 66 year old presents today for a follow up visit. Since his last visit, he has tolerated Zytiga poorly. He is reporting more fatigue and nausea. He is still very functional, is able to drive, perform most activities of daily living. His urine flow is reasonable with the help of Flomax, without it he has had some urine stream difficulties, but otherwise he had not had any weight loss, has not had any appetite changes, has not had any deterioration in his quality of life. No increase in his bone pain. He has one episode of vomiting today. No recent hospitalizations or illnesses.    Medications: I have reviewed the patient's current medications. Current outpatient prescriptions:amLODipine (NORVASC) 10 MG tablet, TAKE 1 TABLET EVERY DAY, Disp: 30 tablet, Rfl: 5;  atenolol (TENORMIN) 50 MG tablet, TAKE 1.5 TABLETS (75 MG TOTAL) BY MOUTH DAILY., Disp: 45 tablet, Rfl: 5;  Calcium Carbonate-Vitamin D (CALCIUM 600+D) 600-400 MG-UNIT per tablet, Take 2 tablets by mouth daily. , Disp: , Rfl:  Denosumab (XGEVA [place]), Inject 120 mg into the skin every 30 (thirty) days. Patient receives  injection at urologists office, Disp: , Rfl: ;  docusate sodium (COLACE) 100 MG capsule, Take 300 mg by mouth daily. , Disp: , Rfl: ;  leuprolide (LUPRON DEPOT) 22.5 MG injection, Inject 22.5 mg into the muscle every 3 (three) months. Or as directed by urology, Disp: 1 each, Rfl: 0 ondansetron (ZOFRAN) 8 MG tablet, Take 1 tablet (8 mg total) by mouth every 8 (eight) hours as needed for nausea., Disp: 20 tablet, Rfl: 0;  oxycodone (OXY-IR) 5 MG capsule, Take 10 mg by mouth every 4 (four) hours as needed., Disp: , Rfl: ;  oxyCODONE (OXYCONTIN) 20 MG 12 hr tablet, Take 20 mg by mouth every 12 (twelve) hours.  , Disp: , Rfl:  potassium chloride SA (K-DUR,KLOR-CON) 20 MEQ tablet, Take 1 tablet (20 mEq total) by mouth daily., Disp: 30 tablet, Rfl: 0;  predniSONE (DELTASONE) 5 MG tablet, Take 1 tablet (5 mg total) by mouth daily., Disp: 60 tablet, Rfl: 1;  Tamsulosin HCl (FLOMAX) 0.4 MG CAPS, Take 0.4 mg by mouth daily. , Disp: , Rfl: ;  ZYTIGA 250 MG tablet, TAKE 4 TABLETS BY MOUTH DAILY ON EMPTY STOMACH 1 HOUR BEFORE OR 2 HOURS AFTER A MEAL., Disp: 120 tablet, Rfl: 1  Allergies: No Known Allergies  Past Medical History, Surgical history, Social history, and Family History were reviewed and updated.  Review of Systems: Constitutional:  Negative for fever, chills, night sweats, anorexia, weight loss, pain. Cardiovascular: no chest pain or dyspnea on exertion Respiratory: negative  Neurological: negative Dermatological: negative ENT: negative Skin: Negative. Gastrointestinal: negative Genito-Urinary: negative Hematological and Lymphatic: negative Breast: negative Musculoskeletal: negative Remaining ROS negative. Physical Exam: Blood pressure 148/79, pulse 81, temperature 97 F (36.1 C), temperature source Oral, resp. rate 18, height 5\' 2"  (1.575 m), weight 182 lb 3.2 oz (82.645 kg). ECOG: 1 General appearance: alert Head: Normocephalic, without obvious abnormality, atraumatic Neck: no adenopathy,  no carotid bruit, no JVD, supple, symmetrical, trachea midline and thyroid not enlarged, symmetric, no tenderness/mass/nodules Lymph nodes: Cervical, supraclavicular, and axillary nodes normal. Heart:regular rate and rhythm, S1, S2 normal, no murmur, click, rub or gallop Lung:chest clear, no wheezing, rales, normal symmetric air entry Abdomin: soft, non-tender, without masses or organomegaly EXT:no erythema, induration, or nodules   Lab Results: Lab Results  Component Value Date   WBC 8.8 04/20/2012   HGB 12.3* 04/20/2012   HCT 36.5* 04/20/2012   MCV 82.7 04/20/2012   PLT 320 04/20/2012     Chemistry      Component Value Date/Time   NA 141 04/20/2012 0926   NA 140 11/01/2011 1325   K 2.9* 04/20/2012 0926   K 3.9 11/01/2011 1325   CL 104 04/20/2012 0926   CL 104 11/01/2011 1325   CO2 27 04/20/2012 0926   CO2 28 11/01/2011 1325   BUN 5.0* 04/20/2012 0926   BUN 9 11/01/2011 1325   CREATININE 0.5* 04/20/2012 0926   CREATININE 0.60 11/01/2011 1325      Component Value Date/Time   CALCIUM 7.9* 04/20/2012 0926   CALCIUM 9.1 11/01/2011 1325   ALKPHOS 1,418 Repeated and Verified* 04/20/2012 0926   ALKPHOS 101 11/01/2011 1325   AST 26 04/20/2012 0926   AST 34 11/01/2011 1325   ALT 9 04/20/2012 0926   ALT 37 11/01/2011 1325   BILITOT 0.54 04/20/2012 0926   BILITOT 0.4 11/01/2011 1325      Results for Jeff Jimenez, Jeff Jimenez (MRN [id number]) as of 04/21/2012 09:03  Ref. Range 03/13/2012 08:57 04/20/2012 09:26  PSA Latest Range: <=4.00 ng/mL 16.85 (H) 49.63 (H)    Impression and Plan: This is a pleasant 66 year old gentleman with the  following issues:  1. Prostate cancer. His diagnosis dates back to October of 2010. He had a prostate-specific antigen of 15, Gleason score of 5+ 4=9. He has developed castration-resistant disease. He has been on Zytiga for the last 3 months. His PSA did go up to 49.63 and is reporting more complications from the [facility].  I discussed the different treatment options  today includingTaxotere or Xtandi. I feel his cancer is moving rapidly and will benefit more from chemotherapy.  Risks and benefits of Taxotere discussed today. He is agreeable to proceed in the near future.  I will add prednisone to his treatment for better tolerance.    2. Bony disease. I do agree with Rivka Barbara under the care of Dr. Mena Goes.   3. Androgen deprivation. He is currently on Lupron 22.5 mg every 3 months. I will recommend continuing that for the time being.   4. Pain. His bony pain is under reasonable control with OxyContin and oxycodone. Would not really change  that at this time.  5. Hypokalemia: Rx for replacement given.   6. Neutropenia prophylaxis: Neulasta will be started after each cycle of chemotherapy.    [facility], MD 12/31/20139:49 AM

## 2012-04-27 ENCOUNTER — Telehealth: Payer: Self-pay | Admitting: *Deleted

## 2012-04-27 ENCOUNTER — Other Ambulatory Visit: Payer: Medicare Other

## 2012-04-27 NOTE — Telephone Encounter (Signed)
PER DR.SHADAD'S 04/21/12 DICTATION- PREDNISONE 5MG  TAKE ONE TAB DAILY. INSTRUCTED PT. TO START PREDNISONE NOW WITH FOOD. STARTING TOMORROW TAKE PREDNISONE IN THE MORNING WITH FOOD. PT. VOICES UNDERSTANDING.

## 2012-04-28 ENCOUNTER — Other Ambulatory Visit (HOSPITAL_BASED_OUTPATIENT_CLINIC_OR_DEPARTMENT_OTHER): Payer: Medicare Other | Admitting: Lab

## 2012-04-28 ENCOUNTER — Ambulatory Visit (HOSPITAL_BASED_OUTPATIENT_CLINIC_OR_DEPARTMENT_OTHER): Payer: Medicare Other

## 2012-04-28 VITALS — BP 143/79 | HR 76 | Temp 97.3°F | Resp 18

## 2012-04-28 DIAGNOSIS — C7951 Secondary malignant neoplasm of bone: Secondary | ICD-10-CM

## 2012-04-28 DIAGNOSIS — C61 Malignant neoplasm of prostate: Secondary | ICD-10-CM

## 2012-04-28 DIAGNOSIS — C7952 Secondary malignant neoplasm of bone marrow: Secondary | ICD-10-CM

## 2012-04-28 DIAGNOSIS — Z5111 Encounter for antineoplastic chemotherapy: Secondary | ICD-10-CM

## 2012-04-28 LAB — CBC WITH DIFFERENTIAL/PLATELET
Eosinophils Absolute: 0.3 10*3/uL (ref 0.0–0.5)
HCT: 39.8 % (ref 38.4–49.9)
LYMPH%: 24.3 % (ref 14.0–49.0)
MCV: 82.6 fL (ref 79.3–98.0)
MONO#: 0.8 10*3/uL (ref 0.1–0.9)
MONO%: 6.6 % (ref 0.0–14.0)
NEUT#: 8.1 10*3/uL — ABNORMAL HIGH (ref 1.5–6.5)
NEUT%: 66.7 % (ref 39.0–75.0)
Platelets: 326 10*3/uL (ref 140–400)
RBC: 4.82 10*6/uL (ref 4.20–5.82)
WBC: 12.2 10*3/uL — ABNORMAL HIGH (ref 4.0–10.3)
nRBC: 0 % (ref 0–0)

## 2012-04-28 LAB — COMPREHENSIVE METABOLIC PANEL (CC13)
BUN: 10 mg/dL (ref 7.0–26.0)
CO2: 22 mEq/L (ref 22–29)
Calcium: 8.4 mg/dL (ref 8.4–10.4)
Chloride: 108 mEq/L — ABNORMAL HIGH (ref 98–107)
Creatinine: 0.6 mg/dL — ABNORMAL LOW (ref 0.7–1.3)
Glucose: 131 mg/dl — ABNORMAL HIGH (ref 70–99)
Total Bilirubin: 0.41 mg/dL (ref 0.20–1.20)

## 2012-04-28 LAB — TECHNOLOGIST REVIEW

## 2012-04-28 MED ORDER — OXYCODONE-ACETAMINOPHEN 5-325 MG PO TABS: 1.0000 | ORAL_TABLET | Freq: Once | ORAL | Status: DC

## 2012-04-28 MED ORDER — ONDANSETRON 8 MG/50ML IVPB (CHCC)
8.0000 mg | Freq: Once | INTRAVENOUS | Status: AC
  Administered 2012-04-28: 8 mg via INTRAVENOUS

## 2012-04-28 MED ORDER — DOCETAXEL CHEMO INJECTION 160 MG/16ML
75.0000 mg/m2 | Freq: Once | INTRAVENOUS | Status: AC
  Administered 2012-04-28: 140 mg via INTRAVENOUS
  Filled 2012-04-28: qty 14

## 2012-04-28 MED ORDER — SODIUM CHLORIDE 0.9 % IV SOLN
Freq: Once | INTRAVENOUS | Status: AC
  Administered 2012-04-28: 100 mL via INTRAVENOUS

## 2012-04-28 MED ORDER — DEXAMETHASONE SODIUM PHOSPHATE 10 MG/ML IJ SOLN
10.0000 mg | Freq: Once | INTRAMUSCULAR | Status: AC
  Administered 2012-04-28: 10 mg via INTRAVENOUS

## 2012-04-28 MED ORDER — SODIUM CHLORIDE 0.9 % IJ SOLN
10.0000 mL | INTRAMUSCULAR | Status: DC | PRN
  Filled 2012-04-28: qty 10

## 2012-04-28 NOTE — Patient Instructions (Addendum)
[facility] Discharge Instructions for Patients Receiving Chemotherapy  Today you received the following chemotherapy agents Taxotere  To help prevent nausea and vomiting after your treatment, we encourage you to take your nausea medication as per Dr. Clelia Croft. If you develop nausea and vomiting that is not controlled by your nausea medication, call the clinic. If it is after clinic hours your family physician or the after hours number for the clinic or go to the Emergency Department.   BELOW ARE SYMPTOMS THAT SHOULD BE REPORTED IMMEDIATELY:  *FEVER GREATER THAN 100.5 F  *CHILLS WITH OR WITHOUT FEVER  NAUSEA AND VOMITING THAT IS NOT CONTROLLED WITH YOUR NAUSEA MEDICATION  *UNUSUAL SHORTNESS OF BREATH  *UNUSUAL BRUISING OR BLEEDING  TENDERNESS IN MOUTH AND THROAT WITH OR WITHOUT PRESENCE OF ULCERS  *URINARY PROBLEMS  *BOWEL PROBLEMS  UNUSUAL RASH Items with * indicate a potential emergency and should be followed up as soon as possible.  One of the nurses will contact you 24 hours after your treatment. Please let the nurse know about any problems that you may have experienced. Feel free to call the clinic you have any questions or concerns. The clinic phone number is [phone].   I have been informed and understand all the instructions given to me. I know to contact the clinic, my physician, or go to the Emergency Department if any problems should occur. I do not have any questions at this time, but understand that I may call the clinic during office hours   should I have any questions or need assistance in obtaining follow up care.    __________________________________________  _____________  __________ Signature of Patient or Authorized Representative            Date                   Time    __________________________________________ Nurse's Signature   Docetaxel injection What is this medicine? DOCETAXEL (doe se TAX el) is a chemotherapy drug. It  targets fast dividing cells, like cancer cells, and causes these cells to die. This medicine is used to treat many types of cancers like breast cancer, certain stomach cancers, head and neck cancer, lung cancer, and prostate cancer. This medicine may be used for other purposes; ask your health care provider or pharmacist if you have questions. What should I tell my health care provider before I take this medicine? They need to know if you have any of these conditions: -infection (especially a virus infection such as chickenpox, cold sores, or herpes) -liver disease -low blood counts, like low white cell, platelet, or red cell counts -an unusual or allergic reaction to docetaxel, polysorbate 80, other chemotherapy agents, other medicines, foods, dyes, or preservatives -pregnant or trying to get pregnant -breast-feeding How should I use this medicine? This drug is given as an infusion into a vein. It is administered in a hospital or clinic by a specially trained health care professional. Talk to your pediatrician regarding the use of this medicine in children. Special care may be needed. Overdosage: If you think you have taken too much of this medicine contact a poison control center or emergency room at once. NOTE: This medicine is only for you. Do not share this medicine with others. What if I miss a dose? It is important not to miss your dose. Call your doctor or health care professional if you are unable to keep an appointment. What may interact with this medicine? -cyclosporine -erythromycin -ketoconazole -medicines  to increase blood counts like filgrastim, pegfilgrastim, sargramostim -vaccines Talk to your doctor or health care professional before taking any of these medicines: -acetaminophen -aspirin -ibuprofen -ketoprofen -naproxen This list may not describe all possible interactions. Give your health care provider a list of all the medicines, herbs, non-prescription drugs, or  dietary supplements you use. Also tell them if you smoke, drink alcohol, or use illegal drugs. Some items may interact with your medicine. What should I watch for while using this medicine? Your condition will be monitored carefully while you are receiving this medicine. You will need important blood work done while you are taking this medicine. This drug may make you feel generally unwell. This is not uncommon, as chemotherapy can affect healthy cells as well as cancer cells. Report any side effects. Continue your course of treatment even though you feel ill unless your doctor tells you to stop. In some cases, you may be given additional medicines to help with side effects. Follow all directions for their use. Call your doctor or health care professional for advice if you get a fever, chills or sore throat, or other symptoms of a cold or flu. Do not treat yourself. This drug decreases your body's ability to fight infections. Try to avoid being around people who are sick. This medicine may increase your risk to bruise or bleed. Call your doctor or health care professional if you notice any unusual bleeding. Be careful brushing and flossing your teeth or using a toothpick because you may get an infection or bleed more easily. If you have any dental work done, tell your dentist you are receiving this medicine. Avoid taking products that contain aspirin, acetaminophen, ibuprofen, naproxen, or ketoprofen unless instructed by your doctor. These medicines may hide a fever. Do not become pregnant while taking this medicine. Women should inform their doctor if they wish to become pregnant or think they might be pregnant. There is a potential for serious side effects to an unborn child. Talk to your health care professional or pharmacist for more information. Do not breast-feed an infant while taking this medicine. What side effects may I notice from receiving this medicine? Side effects that you should report to  your doctor or health care professional as soon as possible: -allergic reactions like skin rash, itching or hives, swelling of the face, lips, or tongue -low blood counts - This drug may decrease the number of white blood cells, red blood cells and platelets. You may be at increased risk for infections and bleeding. -signs of infection - fever or chills, cough, sore throat, pain or difficulty passing urine -signs of decreased platelets or bleeding - bruising, pinpoint red spots on the skin, black, tarry stools, nosebleeds -signs of decreased red blood cells - unusually weak or tired, fainting spells, lightheadedness -breathing problems -fast or irregular heartbeat -low blood pressure -mouth sores -nausea and vomiting -pain, swelling, redness or irritation at the injection site -pain, tingling, numbness in the hands or feet -swelling of the ankle, feet, hands -weight gain Side effects that usually do not require medical attention (report to your prescriber or health care professional if they continue or are bothersome): -bone pain -complete hair loss including hair on your head, underarms, pubic hair, eyebrows, and eyelashes -diarrhea -excessive tearing -changes in the color of fingernails -loosening of the fingernails -nausea -muscle pain -red flush to skin -sweating -weak or tired This list may not describe all possible side effects. Call your doctor for medical advice about side effects. You may  report side effects to FDA at 1-800-FDA-1088. Where should I keep my medicine? This drug is given in a hospital or clinic and will not be stored at home. NOTE: This sheet is a summary. It may not cover all possible information. If you have questions about this medicine, talk to your doctor, pharmacist, or health care provider.  2013, [employer]. (03/21/2008 11:52:10 AM)

## 2012-04-29 ENCOUNTER — Ambulatory Visit (HOSPITAL_BASED_OUTPATIENT_CLINIC_OR_DEPARTMENT_OTHER): Payer: Medicare Other

## 2012-04-29 VITALS — BP 136/90 | HR 91 | Temp 97.8°F

## 2012-04-29 DIAGNOSIS — C61 Malignant neoplasm of prostate: Secondary | ICD-10-CM

## 2012-04-29 DIAGNOSIS — C7951 Secondary malignant neoplasm of bone: Secondary | ICD-10-CM

## 2012-04-29 MED ORDER — PEGFILGRASTIM INJECTION 6 MG/0.6ML
6.0000 mg | Freq: Once | SUBCUTANEOUS | Status: AC
  Administered 2012-04-29: 6 mg via SUBCUTANEOUS
  Filled 2012-04-29: qty 0.6

## 2012-04-29 NOTE — Progress Notes (Signed)
Pt in for neulasta injection, 1st chemo yesterday.  Pt reports taking Zofran 8 mg this morning around 7:30 am, but started getting sick after eating peanut butter toast for breakfast.  Pt states he has vomited x 2 this morning, last after trying to drink some water.  Pt states he feels "blah," denies dizziness or headache.  Pt states he had 3 good BM's last night, 1 loose, liquid BM this morning.  Pt denies any mouthsores.  Spoke with Dr. Clelia Croft, who states this should pass, pt should continue to use zofran, but use q8h around the clock, and call office back if symptoms persist.  Informed pt of this and reviewed foods and liquids to consume while feeling this way.  Pt verbalizes understanding.

## 2012-05-19 ENCOUNTER — Other Ambulatory Visit (HOSPITAL_BASED_OUTPATIENT_CLINIC_OR_DEPARTMENT_OTHER): Payer: Medicare Other | Admitting: Lab

## 2012-05-19 ENCOUNTER — Ambulatory Visit (HOSPITAL_BASED_OUTPATIENT_CLINIC_OR_DEPARTMENT_OTHER): Payer: Medicare Other | Admitting: Oncology

## 2012-05-19 ENCOUNTER — Ambulatory Visit (HOSPITAL_BASED_OUTPATIENT_CLINIC_OR_DEPARTMENT_OTHER): Payer: Medicare Other

## 2012-05-19 ENCOUNTER — Other Ambulatory Visit: Payer: Self-pay | Admitting: *Deleted

## 2012-05-19 ENCOUNTER — Encounter: Payer: Self-pay | Admitting: Oncology

## 2012-05-19 VITALS — BP 144/80 | HR 84 | Temp 98.6°F | Resp 18 | Ht 62.0 in | Wt 183.8 lb

## 2012-05-19 DIAGNOSIS — C61 Malignant neoplasm of prostate: Secondary | ICD-10-CM

## 2012-05-19 DIAGNOSIS — Z5111 Encounter for antineoplastic chemotherapy: Secondary | ICD-10-CM

## 2012-05-19 DIAGNOSIS — E876 Hypokalemia: Secondary | ICD-10-CM

## 2012-05-19 DIAGNOSIS — E291 Testicular hypofunction: Secondary | ICD-10-CM

## 2012-05-19 DIAGNOSIS — C7951 Secondary malignant neoplasm of bone: Secondary | ICD-10-CM

## 2012-05-19 LAB — CBC WITH DIFFERENTIAL/PLATELET
Basophils Absolute: 0.1 10*3/uL (ref 0.0–0.1)
EOS%: 0.8 % (ref 0.0–7.0)
Eosinophils Absolute: 0.1 10*3/uL (ref 0.0–0.5)
HCT: 38.2 % — ABNORMAL LOW (ref 38.4–49.9)
HGB: 11.9 g/dL — ABNORMAL LOW (ref 13.0–17.1)
MCH: 26.9 pg — ABNORMAL LOW (ref 27.2–33.4)
MCV: 86.4 fL (ref 79.3–98.0)
MONO%: 7.3 % (ref 0.0–14.0)
NEUT#: 11.3 10*3/uL — ABNORMAL HIGH (ref 1.5–6.5)
NEUT%: 73.4 % (ref 39.0–75.0)
lymph#: 2.7 10*3/uL (ref 0.9–3.3)

## 2012-05-19 LAB — COMPREHENSIVE METABOLIC PANEL (CC13)
Albumin: 3.6 g/dL (ref 3.5–5.0)
Alkaline Phosphatase: 2251 U/L — ABNORMAL HIGH (ref 40–150)
BUN: 10.5 mg/dL (ref 7.0–26.0)
CO2: 24 mEq/L (ref 22–29)
Glucose: 119 mg/dl — ABNORMAL HIGH (ref 70–99)
Potassium: 5.1 mEq/L (ref 3.5–5.1)

## 2012-05-19 LAB — PSA: PSA: 47.65 ng/mL — ABNORMAL HIGH (ref <=4.00)

## 2012-05-19 MED ORDER — DEXAMETHASONE SODIUM PHOSPHATE 10 MG/ML IJ SOLN
10.0000 mg | Freq: Once | INTRAMUSCULAR | Status: AC
  Administered 2012-05-19: 10 mg via INTRAVENOUS

## 2012-05-19 MED ORDER — PROCHLORPERAZINE MALEATE 10 MG PO TABS: 10.0000 mg | ORAL_TABLET | Freq: Four times a day (QID) | ORAL | Status: DC | PRN

## 2012-05-19 MED ORDER — OXYCODONE HCL 20 MG PO TB12: 20.0000 mg | ORAL_TABLET | Freq: Two times a day (BID) | ORAL | Status: DC

## 2012-05-19 MED ORDER — ONDANSETRON 8 MG/50ML IVPB (CHCC)
8.0000 mg | Freq: Once | INTRAVENOUS | Status: AC
  Administered 2012-05-19: 8 mg via INTRAVENOUS

## 2012-05-19 MED ORDER — DOCETAXEL CHEMO INJECTION 160 MG/16ML
75.0000 mg/m2 | Freq: Once | INTRAVENOUS | Status: AC
  Administered 2012-05-19: 140 mg via INTRAVENOUS
  Filled 2012-05-19: qty 14

## 2012-05-19 MED ORDER — SODIUM CHLORIDE 0.9 % IV SOLN
Freq: Once | INTRAVENOUS | Status: AC
  Administered 2012-05-19: 12:00:00 via INTRAVENOUS

## 2012-05-19 NOTE — Patient Instructions (Addendum)
[facility] Discharge Instructions for Patients Receiving Chemotherapy  Today you received the following chemotherapy agents Taxotere  To help prevent nausea and vomiting after your treatment, we encourage you to take your nausea medication as prescribed.   If you develop nausea and vomiting that is not controlled by your nausea medication, call the clinic. If it is after clinic hours your family physician or the after hours number for the clinic or go to the Emergency Department.   BELOW ARE SYMPTOMS THAT SHOULD BE REPORTED IMMEDIATELY:  *FEVER GREATER THAN 100.5 F  *CHILLS WITH OR WITHOUT FEVER  NAUSEA AND VOMITING THAT IS NOT CONTROLLED WITH YOUR NAUSEA MEDICATION  *UNUSUAL SHORTNESS OF BREATH  *UNUSUAL BRUISING OR BLEEDING  TENDERNESS IN MOUTH AND THROAT WITH OR WITHOUT PRESENCE OF ULCERS  *URINARY PROBLEMS  *BOWEL PROBLEMS  UNUSUAL RASH Items with * indicate a potential emergency and should be followed up as soon as possible.  Feel free to call the clinic you have any questions or concerns. The clinic phone number is [phone].   I have been informed and understand all the instructions given to me. I know to contact the clinic, my physician, or go to the Emergency Department if any problems should occur. I do not have any questions at this time, but understand that I may call the clinic during office hours   should I have any questions or need assistance in obtaining follow up care.    __________________________________________  _____________  __________ Signature of Patient or Authorized Representative            Date                   Time    __________________________________________ Nurse's Signature    

## 2012-05-19 NOTE — Progress Notes (Signed)
Hematology and Oncology Follow Up Visit  Jeff Jimenez [id number] [birth year] 67 y.o. 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  Gleason score 5+4=9 in 5 out of the 8 cores, PSA 15.  Prior Therapy: He was started on Lupron and Casodex for his prostate cancer. His PSA nadir down to 0.02 back in March of 2012. In July of 2012, his PSA was up to 0.6. Casodex was stopped temporarily up until November of 2012. His PSA went up to 0.09. Casodex was restarted in November of 2012. In March of 2013, his PSA was 0.27 and most recently, his PSA was up to 0.76 with a castrate level of testosterone of 17.7. He received Zytiga 01/15/12 through 04/21/12 and this was stopped due to a rising PSA.  Current therapy: Taxotere started on 04/28/12. He is here for cycle 2 today. Lupron and monthly Xgeva given  at [facility] urology.    Interim History: 67 year old presents today for a follow up visit. Jeff Jimenez started on Taxotere 3 weeks ago. States he is eating better and has more energy. He had nausea and vomiting for a couple of days after his chemo despite using Zofran. He is still very functional, is able to drive, perform most activities of daily living. His urine flow is reasonable with the help of Flomax, without it he has had some urine stream difficulties.He has not had any deterioration in his quality of life. No increase in his bone pain. Using Oxycontin TID and Oxycodone for breakthrough pain.No recent hospitalizations or illnesses.    Medications: I have reviewed the patient's current medications. Current outpatient prescriptions:amLODipine (NORVASC) 10 MG tablet, TAKE 1 TABLET EVERY DAY, Disp: 30 tablet, Rfl: 5;  atenolol (TENORMIN) 50 MG tablet, TAKE 1.5 TABLETS (75 MG TOTAL) BY MOUTH DAILY., Disp: 45 tablet, Rfl: 5;  Calcium Carbonate-Vitamin D (CALCIUM 600+D) 600-400 MG-UNIT per tablet, Take  2 tablets by mouth daily. , Disp: , Rfl:  Denosumab (XGEVA [place]), Inject 120 mg into the skin every 30 (thirty) days. Patient receives injection at urologists office, Disp: , Rfl: ;  docusate sodium (COLACE) 100 MG capsule, Take 300 mg by mouth daily. , Disp: , Rfl: ;  leuprolide (LUPRON DEPOT) 22.5 MG injection, Inject 22.5 mg into the muscle every 3 (three) months. Or as directed by urology, Disp: 1 each, Rfl: 0 ondansetron (ZOFRAN) 8 MG tablet, Take 1 tablet (8 mg total) by mouth every 8 (eight) hours as needed for nausea., Disp: 20 tablet, Rfl: 0;  oxycodone (OXY-IR) 5 MG capsule, Take 10 mg by mouth every 4 (four) hours as needed., Disp: , Rfl: ;  oxyCODONE (OXYCONTIN) 20 MG 12 hr tablet, Take 1 tablet (20 mg total) by mouth every 12 (twelve) hours. Take 1 tablet three times a day, Disp: 90 tablet, Rfl: 0 potassium chloride SA (K-DUR,KLOR-CON) 20 MEQ tablet, Take 1 tablet (20 mEq total) by mouth daily., Disp: 30 tablet, Rfl: 0;  predniSONE (DELTASONE) 5 MG tablet, Take 1 tablet (5 mg total) by mouth daily., Disp: 60 tablet, Rfl: 1;  prochlorperazine (COMPAZINE) 10 MG tablet, Take 1 tablet (10 mg total) by mouth every 6 (six) hours as needed., Disp: 60 tablet, Rfl: 1;  Tamsulosin HCl (FLOMAX) 0.4 MG CAPS, Take 0.4 mg by mouth daily. , Disp: , Rfl:  No current facility-administered medications for this visit. Facility-Administered Medications Ordered in Other Visits: DOCEtaxel (TAXOTERE) 140 mg  in dextrose 5 % 250 mL chemo infusion, 75 mg/m2 (Treatment Plan Actual), Intravenous, Once, Benjiman Core, MD, Last Rate: 264 mL/hr at 05/19/12 1252, 140 mg at 05/19/12 1252  Allergies: No Known Allergies  Past Medical History, Surgical history, Social history, and Family History were reviewed and updated.  Review of Systems: Constitutional:  Negative for fever, chills, night sweats, anorexia, weight loss, pain. Cardiovascular: no chest pain or dyspnea on exertion Respiratory: negative Neurological:  negative Dermatological: negative ENT: negative Skin: Negative. Gastrointestinal: negative Genito-Urinary: negative Hematological and Lymphatic: negative Breast: negative Musculoskeletal: negative Remaining ROS negative.  Physical Exam: Blood pressure 144/80, pulse 84, temperature 98.6 F (37 C), temperature source Oral, resp. rate 18, height 5\' 2"  (1.575 m), weight 183 lb 12.8 oz (83.371 kg). ECOG: 1 General appearance: alert Head: Normocephalic, without obvious abnormality, atraumatic Neck: no adenopathy, no carotid bruit, no JVD, supple, symmetrical, trachea midline and thyroid not enlarged, symmetric, no tenderness/mass/nodules Lymph nodes: Cervical, supraclavicular, and axillary nodes normal. Heart:regular rate and rhythm, S1, S2 normal, no murmur, click, rub or gallop Lung:chest clear, no wheezing, rales, normal symmetric air entry Abdomen: soft, non-tender, without masses or organomegaly EXT:no erythema, induration, or nodules   Lab Results: Lab Results  Component Value Date   WBC 15.4* 05/19/2012   HGB 11.9* 05/19/2012   HCT 38.2* 05/19/2012   MCV 86.4 05/19/2012   PLT 293 05/19/2012     Chemistry      Component Value Date/Time   NA 138 04/28/2012 0840   NA 140 11/01/2011 1325   K 3.9 04/28/2012 0840   K 3.9 11/01/2011 1325   CL 108* 04/28/2012 0840   CL 104 11/01/2011 1325   CO2 22 04/28/2012 0840   CO2 28 11/01/2011 1325   BUN 10.0 04/28/2012 0840   BUN 9 11/01/2011 1325   CREATININE 0.6* 04/28/2012 0840   CREATININE 0.60 11/01/2011 1325      Component Value Date/Time   CALCIUM 8.4 04/28/2012 0840   CALCIUM 9.1 11/01/2011 1325   ALKPHOS 2,815 Repeated and Verified* 04/28/2012 0840   ALKPHOS 101 11/01/2011 1325   AST 104* 04/28/2012 0840   AST 34 11/01/2011 1325   ALT 20 04/28/2012 0840   ALT 37 11/01/2011 1325   BILITOT 0.41 04/28/2012 0840   BILITOT 0.4 11/01/2011 1325     Results for Jeff Jimenez, Jeff Jimenez (MRN [id number]) as of 05/19/2012 11:16  Ref. Range 02/07/2012 14:02 03/13/2012  08:57 04/20/2012 09:26  PSA Latest Range: <=4.00 ng/mL 5.17 (H) 16.85 (H) 49.63 (H)     Impression and Plan: This is a pleasant 67 year old gentleman with the following issues:   1. Prostate cancer. His diagnosis dates back to October of 2010. He had a prostate-specific antigen of 15, Gleason score of 5+ 4=9. He has developed castration-resistant disease. He recently started on Taxotere with grade 1 nausea/vomiting. Recommend that he proceed with cycle 2 of Taxotere today.   2. Bony disease. Continue Xgeva at [facility].   3. Androgen deprivation. He is currently on Lupron 22.5 mg every 3 months. I will recommend continuing that for the time being.   4. Pain. His bony pain is under reasonable control with OxyContin and oxycodone. He received a refill of OxyContin today..  5. Hypokalemia: CMET pending today.   6. Neutropenia prophylaxis: On Neulasta with each cycle of chemo.  7. Nausea/vomiting. Continue Zofran. I have sent a prescription for Compazine to his pharmacy.  8. Follow-up: 3 weeks.    Clenton Pare 1/28/201412:55 PM

## 2012-05-20 ENCOUNTER — Ambulatory Visit (HOSPITAL_BASED_OUTPATIENT_CLINIC_OR_DEPARTMENT_OTHER): Payer: Medicare Other

## 2012-05-20 VITALS — BP 139/87 | HR 96 | Temp 98.9°F

## 2012-05-20 DIAGNOSIS — C7951 Secondary malignant neoplasm of bone: Secondary | ICD-10-CM

## 2012-05-20 DIAGNOSIS — C61 Malignant neoplasm of prostate: Secondary | ICD-10-CM

## 2012-05-20 MED ORDER — PEGFILGRASTIM INJECTION 6 MG/0.6ML
6.0000 mg | Freq: Once | SUBCUTANEOUS | Status: AC
  Administered 2012-05-20: 6 mg via SUBCUTANEOUS
  Filled 2012-05-20: qty 0.6

## 2012-05-20 NOTE — Progress Notes (Signed)
Patient asked about if he were to take any more Potassium.  Took his last one today.  His CMP from yesterday showed K+ 5.1. Spoke with Belenda Cruise, NP, who said that he didn't need any more potassium supplements, but gave him a list of foods that are high in potassium. Patient and wife voice understanding.

## 2012-05-25 ENCOUNTER — Other Ambulatory Visit: Payer: Medicare Other

## 2012-06-05 ENCOUNTER — Ambulatory Visit: Payer: Medicare Other | Admitting: Oncology

## 2012-06-05 ENCOUNTER — Other Ambulatory Visit: Payer: Medicare Other | Admitting: Lab

## 2012-06-09 ENCOUNTER — Ambulatory Visit (HOSPITAL_BASED_OUTPATIENT_CLINIC_OR_DEPARTMENT_OTHER): Payer: Medicare Other | Admitting: Oncology

## 2012-06-09 ENCOUNTER — Other Ambulatory Visit (HOSPITAL_BASED_OUTPATIENT_CLINIC_OR_DEPARTMENT_OTHER): Payer: Medicare Other | Admitting: Lab

## 2012-06-09 ENCOUNTER — Telehealth: Payer: Self-pay | Admitting: Oncology

## 2012-06-09 ENCOUNTER — Ambulatory Visit (HOSPITAL_BASED_OUTPATIENT_CLINIC_OR_DEPARTMENT_OTHER): Payer: Medicare Other

## 2012-06-09 VITALS — BP 149/85 | HR 85 | Temp 98.4°F | Resp 18 | Ht 62.0 in | Wt 187.5 lb

## 2012-06-09 DIAGNOSIS — C7951 Secondary malignant neoplasm of bone: Secondary | ICD-10-CM

## 2012-06-09 DIAGNOSIS — C61 Malignant neoplasm of prostate: Secondary | ICD-10-CM

## 2012-06-09 DIAGNOSIS — Z5111 Encounter for antineoplastic chemotherapy: Secondary | ICD-10-CM

## 2012-06-09 LAB — CBC WITH DIFFERENTIAL/PLATELET
Basophils Absolute: 0.1 10*3/uL (ref 0.0–0.1)
EOS%: 1.1 % (ref 0.0–7.0)
Eosinophils Absolute: 0.1 10*3/uL (ref 0.0–0.5)
HCT: 36.5 % — ABNORMAL LOW (ref 38.4–49.9)
HGB: 11.6 g/dL — ABNORMAL LOW (ref 13.0–17.1)
MCH: 27.8 pg (ref 27.2–33.4)
MONO#: 1.3 10*3/uL — ABNORMAL HIGH (ref 0.1–0.9)
NEUT#: 8.9 10*3/uL — ABNORMAL HIGH (ref 1.5–6.5)
NEUT%: 67.9 % (ref 39.0–75.0)
lymph#: 2.6 10*3/uL (ref 0.9–3.3)

## 2012-06-09 MED ORDER — SODIUM CHLORIDE 0.9 % IV SOLN
Freq: Once | INTRAVENOUS | Status: AC
  Administered 2012-06-09: 09:00:00 via INTRAVENOUS

## 2012-06-09 MED ORDER — DOCETAXEL CHEMO INJECTION 160 MG/16ML
75.0000 mg/m2 | Freq: Once | INTRAVENOUS | Status: AC
  Administered 2012-06-09: 140 mg via INTRAVENOUS
  Filled 2012-06-09: qty 14

## 2012-06-09 MED ORDER — OXYCODONE HCL 20 MG PO TB12: 20.0000 mg | ORAL_TABLET | Freq: Three times a day (TID) | ORAL | Status: DC

## 2012-06-09 MED ORDER — MAGIC MOUTHWASH: 5.0000 mL | Freq: Three times a day (TID) | ORAL | Status: DC

## 2012-06-09 MED ORDER — ONDANSETRON 8 MG/50ML IVPB (CHCC)
8.0000 mg | Freq: Once | INTRAVENOUS | Status: AC
  Administered 2012-06-09: 8 mg via INTRAVENOUS

## 2012-06-09 MED ORDER — DEXAMETHASONE SODIUM PHOSPHATE 10 MG/ML IJ SOLN
10.0000 mg | Freq: Once | INTRAMUSCULAR | Status: AC
  Administered 2012-06-09: 10 mg via INTRAVENOUS

## 2012-06-09 NOTE — Telephone Encounter (Signed)
gv and printed pt appt schedule for pt for March and April...emaield michelle to add tx

## 2012-06-09 NOTE — Progress Notes (Signed)
Hematology and Oncology Follow Up Visit  Jeff Jimenez [id number] 25-Jul-1945 67 y.o. 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  Gleason score 5+4=9 in 5 out of the 8 cores, PSA 15.  Prior Therapy: He was started on Lupron and Casodex for his prostate cancer. His PSA nadir down to 0.02 back in March of 2012. In July of 2012, his PSA was up to 0.6. Casodex was stopped temporarily up until November of 2012. His PSA went up to 0.09. Casodex was restarted in November of 2012. In March of 2013, his PSA was 0.27 and most recently, his PSA was up to 0.76 with a castrate level of testosterone of 17.7. He received Zytiga 01/15/12 through 04/21/12 and this was stopped due to a rising PSA.  Current therapy: Taxotere chemotherapy with once daily prednisone started on 04/28/12. He is here for cycle 3 today. Lupron and monthly Xgeva given  at [facility] urology.  Interim History: 67 year old presents today for a follow up visit. Jeff Jimenez started on Taxotere for 2 cycles now. States he is eating better and has more energy. He had nausea and vomiting for a couple of days after his chemo despite using Zofran but improved very qiuck. He is still very functional, is able to drive, perform most activities of daily living. His urine flow is reasonable with the help of Flomax, without it he has had some urine stream difficulties.He has not had any deterioration in his quality of life. No increase in his bone pain. Using Oxycontin TID and Oxycodone for breakthrough pain.No recent hospitalizations or illnesses. No neuropathy noted at this time. He reports slight increase bone pain after neulasta.    Medications: I have reviewed the patient's current medications. Current outpatient prescriptions:Alum & Mag Hydroxide-Simeth (MAGIC MOUTHWASH) SOLN, Take 5 mLs by mouth 3 (three) times daily., Disp: 60 mL, Rfl: 1;   amLODipine (NORVASC) 10 MG tablet, TAKE 1 TABLET EVERY DAY, Disp: 30 tablet, Rfl: 5;  atenolol (TENORMIN) 50 MG tablet, TAKE 1.5 TABLETS (75 MG TOTAL) BY MOUTH DAILY., Disp: 45 tablet, Rfl: 5 Calcium Carbonate-Vitamin D (CALCIUM 600+D) 600-400 MG-UNIT per tablet, Take 2 tablets by mouth daily. , Disp: , Rfl: ;  Denosumab (XGEVA [place]), Inject 120 mg into the skin every 30 (thirty) days. Patient receives injection at urologists office, Disp: , Rfl: ;  docusate sodium (COLACE) 100 MG capsule, Take 300 mg by mouth daily. , Disp: , Rfl:  leuprolide (LUPRON DEPOT) 22.5 MG injection, Inject 22.5 mg into the muscle every 3 (three) months. Or as directed by urology, Disp: 1 each, Rfl: 0;  ondansetron (ZOFRAN) 8 MG tablet, Take 1 tablet (8 mg total) by mouth every 8 (eight) hours as needed for nausea., Disp: 20 tablet, Rfl: 0;  oxycodone (OXY-IR) 5 MG capsule, Take 10 mg by mouth every 4 (four) hours as needed., Disp: , Rfl:  oxyCODONE (OXYCONTIN) 20 MG 12 hr tablet, Take 1 tablet (20 mg total) by mouth 3 (three) times daily. Take 1 tablet three times a day, Disp: 90 tablet, Rfl: 0;  predniSONE (DELTASONE) 5 MG tablet, Take 1 tablet (5 mg total) by mouth daily., Disp: 60 tablet, Rfl: 1;  prochlorperazine (COMPAZINE) 10 MG tablet, Take 1 tablet (10 mg total) by mouth every 6 (six) hours as needed., Disp: 60 tablet, Rfl: 1 Tamsulosin HCl (FLOMAX) 0.4 MG CAPS, Take 0.4 mg by mouth daily. ,  Disp: , Rfl:   Allergies: No Known Allergies  Past Medical History, Surgical history, Social history, and Family History were reviewed and updated.  Review of Systems: Constitutional:  Negative for fever, chills, night sweats, anorexia, weight loss, pain. Cardiovascular: no chest pain or dyspnea on exertion Respiratory: negative Neurological: negative Dermatological: negative ENT: negative Skin: Negative. Gastrointestinal: negative Genito-Urinary: negative Hematological and Lymphatic: negative Breast:  negative Musculoskeletal: negative Remaining ROS negative.  Physical Exam: Blood pressure 149/85, pulse 85, temperature 98.4 F (36.9 C), temperature source Oral, resp. rate 18, height 5\' 2"  (1.575 m), weight 187 lb 8 oz (85.049 kg). ECOG: 1 General appearance: alert Head: Normocephalic, without obvious abnormality, atraumatic Neck: no adenopathy, no carotid bruit, no JVD, supple, symmetrical, trachea midline and thyroid not enlarged, symmetric, no tenderness/mass/nodules Lymph nodes: Cervical, supraclavicular, and axillary nodes normal. Heart:regular rate and rhythm, S1, S2 normal, no murmur, click, rub or gallop Lung:chest clear, no wheezing, rales, normal symmetric air entry Abdomen: soft, non-tender, without masses or organomegaly EXT:no erythema, induration, or nodules   Lab Results: Lab Results  Component Value Date   WBC 13.1* 06/09/2012   HGB 11.6* 06/09/2012   HCT 36.5* 06/09/2012   MCV 87.3 06/09/2012   PLT 280 06/09/2012     Chemistry      Component Value Date/Time   NA 137 05/19/2012 1019   NA 140 11/01/2011 1325   K 5.1 05/19/2012 1019   K 3.9 11/01/2011 1325   CL 106 05/19/2012 1019   CL 104 11/01/2011 1325   CO2 24 05/19/2012 1019   CO2 28 11/01/2011 1325   BUN 10.5 05/19/2012 1019   BUN 9 11/01/2011 1325   CREATININE 0.6* 05/19/2012 1019   CREATININE 0.60 11/01/2011 1325      Component Value Date/Time   CALCIUM 7.5* 05/19/2012 1019   CALCIUM 9.1 11/01/2011 1325   ALKPHOS 2,251 Repeated and Verified* 05/19/2012 1019   ALKPHOS 101 11/01/2011 1325   AST 63* 05/19/2012 1019   AST 34 11/01/2011 1325   ALT 64* 05/19/2012 1019   ALT 37 11/01/2011 1325   BILITOT 0.50 05/19/2012 1019   BILITOT 0.4 11/01/2011 1325     Results for Jeff Jimenez, Jeff Jimenez (MRN [id number]) as of 06/09/2012 08:40  Ref. Range 04/20/2012 09:26 05/19/2012 10:19  PSA Latest Range: <=4.00 ng/mL 49.63 (H) 47.65 (H)      Impression and Plan: This is a pleasant 67 year old gentleman with the following issues:    1. Prostate cancer. His diagnosis dates back to October of 2010. He had a prostate-specific antigen of 15, Gleason score of 5+ 4=9. He has developed castration-resistant disease. He recently started on Taxotere without major complications. His PSA is declining slowly. Recommend that he proceed with cycle 3 of Taxotere today.   2. Bony disease. Continue Xgeva at [facility].   3. Androgen deprivation. He is currently on Lupron 22.5 mg every 3 months. I will recommend continuing that for the time being.   4. Pain. His bony pain is under reasonable control with OxyContin and oxycodone. He received a refill of OxyContin today to take every 8 hours.   5. Hypokalemia: CMET pending today.   6. Neutropenia prophylaxis: On Neulasta with each cycle of chemo.  7. Nausea/vomiting. Continue Zofran. Much improved now.  8. Follow-up: 3 weeks for cycle 4 of chemotherapy.    Aloise Copus 2/18/20148:38 AM

## 2012-06-09 NOTE — Patient Instructions (Signed)
[facility] Discharge Instructions for Patients Receiving Chemotherapy  Today you received the following chemotherapy agents Taxotere.  To help prevent nausea and vomiting after your treatment, we encourage you to take your nausea medication.  If you develop nausea and vomiting that is not controlled by your nausea medication, call the clinic. If it is after clinic hours your family physician or the after hours number for the clinic or go to the Emergency Department.   BELOW ARE SYMPTOMS THAT SHOULD BE REPORTED IMMEDIATELY:  *FEVER GREATER THAN 100.5 F  *CHILLS WITH OR WITHOUT FEVER  NAUSEA AND VOMITING THAT IS NOT CONTROLLED WITH YOUR NAUSEA MEDICATION  *UNUSUAL SHORTNESS OF BREATH  *UNUSUAL BRUISING OR BLEEDING  TENDERNESS IN MOUTH AND THROAT WITH OR WITHOUT PRESENCE OF ULCERS  *URINARY PROBLEMS  *BOWEL PROBLEMS  UNUSUAL RASH Items with * indicate a potential emergency and should be followed up as soon as possible.  One of the nurses will contact you 24 hours after your treatment. Please let the nurse know about any problems that you may have experienced. Feel free to call the clinic you have any questions or concerns. The clinic phone number is [phone].   I have been informed and understand all the instructions given to me. I know to contact the clinic, my physician, or go to the Emergency Department if any problems should occur. I do not have any questions at this time, but understand that I may call the clinic during office hours   should I have any questions or need assistance in obtaining follow up care.    __________________________________________  _____________  __________ Signature of Patient or Authorized Representative            Date                   Time    __________________________________________ Nurse's Signature

## 2012-06-10 ENCOUNTER — Ambulatory Visit (HOSPITAL_BASED_OUTPATIENT_CLINIC_OR_DEPARTMENT_OTHER): Payer: Medicare Other

## 2012-06-10 ENCOUNTER — Telehealth: Payer: Self-pay | Admitting: Oncology

## 2012-06-10 ENCOUNTER — Telehealth: Payer: Self-pay | Admitting: *Deleted

## 2012-06-10 VITALS — BP 115/75 | HR 87 | Temp 98.4°F

## 2012-06-10 DIAGNOSIS — C61 Malignant neoplasm of prostate: Secondary | ICD-10-CM

## 2012-06-10 DIAGNOSIS — C7951 Secondary malignant neoplasm of bone: Secondary | ICD-10-CM

## 2012-06-10 MED ORDER — PEGFILGRASTIM INJECTION 6 MG/0.6ML
6.0000 mg | Freq: Once | SUBCUTANEOUS | Status: AC
  Administered 2012-06-10: 6 mg via SUBCUTANEOUS
  Filled 2012-06-10: qty 0.6

## 2012-06-10 NOTE — Telephone Encounter (Signed)
Tia added tx

## 2012-06-10 NOTE — Telephone Encounter (Signed)
Message copied by Reesa Chew on Wed Jun 10, 2012 10:11 AM ------      Message from: Benjiman Core      Created: Wed Jun 10, 2012  9:53 AM       Please call his PSA. Down again. ------

## 2012-06-10 NOTE — Telephone Encounter (Signed)
Called patient with results of psa done yesterday.

## 2012-06-18 ENCOUNTER — Encounter: Payer: Self-pay | Admitting: *Deleted

## 2012-06-19 ENCOUNTER — Encounter: Payer: Self-pay | Admitting: *Deleted

## 2012-06-30 ENCOUNTER — Encounter: Payer: Self-pay | Admitting: Oncology

## 2012-06-30 ENCOUNTER — Ambulatory Visit (HOSPITAL_BASED_OUTPATIENT_CLINIC_OR_DEPARTMENT_OTHER): Payer: Medicare Other | Admitting: Oncology

## 2012-06-30 ENCOUNTER — Ambulatory Visit (HOSPITAL_BASED_OUTPATIENT_CLINIC_OR_DEPARTMENT_OTHER): Payer: Medicare Other

## 2012-06-30 ENCOUNTER — Other Ambulatory Visit (HOSPITAL_BASED_OUTPATIENT_CLINIC_OR_DEPARTMENT_OTHER): Payer: Medicare Other | Admitting: Lab

## 2012-06-30 VITALS — BP 127/74 | HR 80 | Temp 97.6°F | Resp 18 | Ht 62.0 in | Wt 189.9 lb

## 2012-06-30 DIAGNOSIS — C7952 Secondary malignant neoplasm of bone marrow: Secondary | ICD-10-CM

## 2012-06-30 DIAGNOSIS — C61 Malignant neoplasm of prostate: Secondary | ICD-10-CM

## 2012-06-30 DIAGNOSIS — R112 Nausea with vomiting, unspecified: Secondary | ICD-10-CM

## 2012-06-30 DIAGNOSIS — E876 Hypokalemia: Secondary | ICD-10-CM

## 2012-06-30 DIAGNOSIS — Z5111 Encounter for antineoplastic chemotherapy: Secondary | ICD-10-CM

## 2012-06-30 LAB — CBC WITH DIFFERENTIAL/PLATELET
BASO%: 0.7 % (ref 0.0–2.0)
Eosinophils Absolute: 0.1 10*3/uL (ref 0.0–0.5)
HCT: 35.9 % — ABNORMAL LOW (ref 38.4–49.9)
MCHC: 31.8 g/dL — ABNORMAL LOW (ref 32.0–36.0)
MONO#: 0.7 10*3/uL (ref 0.1–0.9)
NEUT#: 7.9 10*3/uL — ABNORMAL HIGH (ref 1.5–6.5)
NEUT%: 73.5 % (ref 39.0–75.0)
Platelets: 256 10*3/uL (ref 140–400)
WBC: 10.7 10*3/uL — ABNORMAL HIGH (ref 4.0–10.3)
lymph#: 2 10*3/uL (ref 0.9–3.3)
nRBC: 1 % — ABNORMAL HIGH (ref 0–0)

## 2012-06-30 LAB — COMPREHENSIVE METABOLIC PANEL (CC13)
Albumin: 3.4 g/dL — ABNORMAL LOW (ref 3.5–5.0)
BUN: 12.7 mg/dL (ref 7.0–26.0)
CO2: 20 mEq/L — ABNORMAL LOW (ref 22–29)
Glucose: 136 mg/dl — ABNORMAL HIGH (ref 70–99)
Sodium: 138 mEq/L (ref 136–145)
Total Bilirubin: 0.48 mg/dL (ref 0.20–1.20)
Total Protein: 6.6 g/dL (ref 6.4–8.3)

## 2012-06-30 LAB — PSA: PSA: 27.06 ng/mL — ABNORMAL HIGH (ref <=4.00)

## 2012-06-30 MED ORDER — ONDANSETRON 8 MG/50ML IVPB (CHCC)
8.0000 mg | Freq: Once | INTRAVENOUS | Status: AC
  Administered 2012-06-30: 8 mg via INTRAVENOUS

## 2012-06-30 MED ORDER — SODIUM CHLORIDE 0.9 % IV SOLN
75.0000 mg/m2 | Freq: Once | INTRAVENOUS | Status: AC
  Administered 2012-06-30: 140 mg via INTRAVENOUS
  Filled 2012-06-30: qty 14

## 2012-06-30 MED ORDER — OXYCODONE HCL 20 MG PO TB12: 20.0000 mg | ORAL_TABLET | Freq: Three times a day (TID) | ORAL | Status: DC

## 2012-06-30 MED ORDER — SODIUM CHLORIDE 0.9 % IV SOLN
Freq: Once | INTRAVENOUS | Status: AC
  Administered 2012-06-30: 10:00:00 via INTRAVENOUS

## 2012-06-30 MED ORDER — DEXAMETHASONE SODIUM PHOSPHATE 10 MG/ML IJ SOLN
10.0000 mg | Freq: Once | INTRAMUSCULAR | Status: AC
  Administered 2012-06-30: 10 mg via INTRAVENOUS

## 2012-06-30 NOTE — Progress Notes (Signed)
Hematology and Oncology Follow Up Visit  Jeff Jimenez [id number] Jan 16, 1946 67 y.o. 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  Gleason score 5+4=9 in 5 out of the 8 cores, PSA 15.  Prior Therapy: He was started on Lupron and Casodex for his prostate cancer. His PSA nadir down to 0.02 back in March of 2012. In July of 2012, his PSA was up to 0.6. Casodex was stopped temporarily up until November of 2012. His PSA went up to 0.09. Casodex was restarted in November of 2012. In March of 2013, his PSA was 0.27 and most recently, his PSA was up to 0.76 with a castrate level of testosterone of 17.7. He received Zytiga 01/15/12 through 04/21/12 and this was stopped due to a rising PSA.  Current therapy: Taxotere chemotherapy with once daily prednisone started on 04/28/12. He is here for cycle 4 today. Lupron and monthly Xgeva given  at [facility] urology.  Interim History: 67 year old presents today for a follow up visit. Jeff Jimenez started on Taxotere for 3 cycles now. States he is eating better and has more energy. Nausea is controlled with antiemetics. No vomiting. He is still very functional, is able to drive, perform most activities of daily living. His urine flow is reasonable with the help of Flomax, without it he has had some urine stream difficulties.He has not had any deterioration in his quality of life. No increase in his bone pain. Using Oxycontin TID and Oxycodone for breakthrough pain.No recent hospitalizations or illnesses. No neuropathy noted at this time. He reports slight increase bone pain after neulasta.    Medications: I have reviewed the patient's current medications. Current outpatient prescriptions:Alum & Mag Hydroxide-Simeth (MAGIC MOUTHWASH) SOLN, Take 5 mLs by mouth 3 (three) times daily., Disp: 60 mL, Rfl: 1;  amLODipine (NORVASC) 10 MG tablet, TAKE 1 TABLET EVERY DAY,  Disp: 30 tablet, Rfl: 5;  atenolol (TENORMIN) 50 MG tablet, TAKE 1.5 TABLETS (75 MG TOTAL) BY MOUTH DAILY., Disp: 45 tablet, Rfl: 5 Calcium Carbonate-Vitamin D (CALCIUM 600+D) 600-400 MG-UNIT per tablet, Take 2 tablets by mouth daily. , Disp: , Rfl: ;  Denosumab (XGEVA [place]), Inject 120 mg into the skin every 30 (thirty) days. Patient receives injection at urologists office, Disp: , Rfl: ;  docusate sodium (COLACE) 100 MG capsule, Take 300 mg by mouth daily. , Disp: , Rfl:  leuprolide (LUPRON DEPOT) 22.5 MG injection, Inject 22.5 mg into the muscle every 3 (three) months. Or as directed by urology, Disp: 1 each, Rfl: 0;  ondansetron (ZOFRAN) 8 MG tablet, Take 1 tablet (8 mg total) by mouth every 8 (eight) hours as needed for nausea., Disp: 20 tablet, Rfl: 0;  oxycodone (OXY-IR) 5 MG capsule, Take 10 mg by mouth every 4 (four) hours as needed., Disp: , Rfl:  oxyCODONE (OXYCONTIN) 20 MG 12 hr tablet, Take 1 tablet (20 mg total) by mouth 3 (three) times daily. Take 1 tablet three times a day, Disp: 90 tablet, Rfl: 0;  predniSONE (DELTASONE) 5 MG tablet, Take 1 tablet (5 mg total) by mouth daily., Disp: 60 tablet, Rfl: 1;  prochlorperazine (COMPAZINE) 10 MG tablet, Take 1 tablet (10 mg total) by mouth every 6 (six) hours as needed., Disp: 60 tablet, Rfl: 1 Tamsulosin HCl (FLOMAX) 0.4 MG CAPS, Take 0.4 mg by mouth daily. , Disp: , Rfl:  No current facility-administered medications for this visit. Facility-Administered Medications  Ordered in Other Visits: DOCEtaxel (TAXOTERE) 140 mg in dextrose 5 % 250 mL chemo infusion, 75 mg/m2 (Treatment Plan Actual), Intravenous, Once, Benjiman Core, MD;  ondansetron [facility]) IVPB 8 mg, 8 mg, Intravenous, Once, Benjiman Core, MD, 8 mg at 06/30/12 1030  Allergies: No Known Allergies  Past Medical History, Surgical history, Social history, and Family History were reviewed and updated.  Review of Systems: Constitutional:  Negative for fever, chills, night sweats, anorexia,  weight loss, pain. Cardiovascular: no chest pain or dyspnea on exertion Respiratory: negative Neurological: negative Dermatological: negative ENT: negative Skin: Negative. Gastrointestinal: negative Genito-Urinary: negative Hematological and Lymphatic: negative Breast: negative Musculoskeletal: negative Remaining ROS negative.  Physical Exam: Blood pressure 127/74, pulse 80, temperature 97.6 F (36.4 C), temperature source Oral, resp. rate 18, height 5\' 2"  (1.575 m), weight 189 lb 14.4 oz (86.138 kg). ECOG: 1 General appearance: alert Head: Normocephalic, without obvious abnormality, atraumatic Neck: no adenopathy, no carotid bruit, no JVD, supple, symmetrical, trachea midline and thyroid not enlarged, symmetric, no tenderness/mass/nodules Lymph nodes: Cervical, supraclavicular, and axillary nodes normal. Heart:regular rate and rhythm, S1, S2 normal, no murmur, click, rub or gallop Lung:chest clear, no wheezing, rales, normal symmetric air entry Abdomen: soft, non-tender, without masses or organomegaly EXT:no erythema, induration, or nodules   Lab Results: Lab Results  Component Value Date   WBC 10.7* 06/30/2012   HGB 11.4* 06/30/2012   HCT 35.9* 06/30/2012   MCV 88.4 06/30/2012   PLT 256 06/30/2012     Chemistry      Component Value Date/Time   NA 137 05/19/2012 1019   NA 140 11/01/2011 1325   K 5.1 05/19/2012 1019   K 3.9 11/01/2011 1325   CL 106 05/19/2012 1019   CL 104 11/01/2011 1325   CO2 24 05/19/2012 1019   CO2 28 11/01/2011 1325   BUN 10.5 05/19/2012 1019   BUN 9 11/01/2011 1325   CREATININE 0.6* 05/19/2012 1019   CREATININE 0.60 11/01/2011 1325      Component Value Date/Time   CALCIUM 7.5* 05/19/2012 1019   CALCIUM 9.1 11/01/2011 1325   ALKPHOS 2,251 Repeated and Verified* 05/19/2012 1019   ALKPHOS 101 11/01/2011 1325   AST 63* 05/19/2012 1019   AST 34 11/01/2011 1325   ALT 64* 05/19/2012 1019   ALT 37 11/01/2011 1325   BILITOT 0.50 05/19/2012 1019   BILITOT 0.4  11/01/2011 1325     Results for Jeff Jimenez, Jeff Jimenez (MRN [id number]) as of 06/30/2012 10:32  Ref. Range 02/07/2012 14:02 03/13/2012 08:57 04/20/2012 09:26 05/19/2012 10:19 06/09/2012 07:52  PSA Latest Range: <=4.00 ng/mL 5.17 (H) 16.85 (H) 49.63 (H) 47.65 (H) 27.57 (H)     Impression and Plan: This is a pleasant 67 year old gentleman with the following issues:   1. Prostate cancer. His diagnosis dates back to October of 2010. He had a prostate-specific antigen of 15, Gleason score of 5+ 4=9. He has developed castration-resistant disease. He recently started on Taxotere without major complications. His PSA is declining slowly. Recommend that he proceed with cycle 4 of Taxotere today.   2. Bony disease. Continue Xgeva at [facility].   3. Androgen deprivation. He is currently on Lupron 22.5 mg every 3 months. I will recommend continuing that for the time being. Due April 2014.  4. Pain. His bony pain is under reasonable control with OxyContin and oxycodone. He received a refill of OxyContin today to take every 8 hours.   5. Hypokalemia: CMET pending today.   6. Neutropenia prophylaxis:  On Neulasta with each cycle of chemo.  7. Nausea/vomiting. Continue Zofran. Much improved now.  8. Follow-up: 3 weeks for cycle 5 of chemotherapy.    [place], [place] 3/11/201410:32 AM

## 2012-06-30 NOTE — Patient Instructions (Signed)
[facility] Discharge Instructions for Patients Receiving Chemotherapy  Today you received the following chemotherapy agents Taxotere To help prevent nausea and vomiting after your treatment, we encourage you to take your nausea medication as prescribed.  If you develop nausea and vomiting that is not controlled by your nausea medication, call the clinic. If it is after clinic hours your family physician or the after hours number for the clinic or go to the Emergency Department.   BELOW ARE SYMPTOMS THAT SHOULD BE REPORTED IMMEDIATELY:  *FEVER GREATER THAN 100.5 F  *CHILLS WITH OR WITHOUT FEVER  NAUSEA AND VOMITING THAT IS NOT CONTROLLED WITH YOUR NAUSEA MEDICATION  *UNUSUAL SHORTNESS OF BREATH  *UNUSUAL BRUISING OR BLEEDING  TENDERNESS IN MOUTH AND THROAT WITH OR WITHOUT PRESENCE OF ULCERS  *URINARY PROBLEMS  *BOWEL PROBLEMS  UNUSUAL RASH Items with * indicate a potential emergency and should be followed up as soon as possible.  One of the nurses will contact you 24 hours after your treatment. Please let the nurse know about any problems that you may have experienced. Feel free to call the clinic you have any questions or concerns. The clinic phone number is [phone].   I have been informed and understand all the instructions given to me. I know to contact the clinic, my physician, or go to the Emergency Department if any problems should occur. I do not have any questions at this time, but understand that I may call the clinic during office hours   should I have any questions or need assistance in obtaining follow up care.    __________________________________________  _____________  __________ Signature of Patient or Authorized Representative            Date                   Time    __________________________________________ Nurse's Signature

## 2012-06-30 NOTE — Patient Instructions (Addendum)
Results for MASTER, TOUCHET (MRN [id number]) as of 06/30/2012 09:42  Ref. Range 02/07/2012 14:02 03/13/2012 08:57 04/20/2012 09:26 05/19/2012 10:19 06/09/2012 07:52  PSA Latest Range: <=4.00 ng/mL 5.17 (H) 16.85 (H) 49.63 (H) 47.65 (H) 27.57 (H)

## 2012-07-01 ENCOUNTER — Ambulatory Visit (HOSPITAL_BASED_OUTPATIENT_CLINIC_OR_DEPARTMENT_OTHER): Payer: Medicare Other

## 2012-07-01 VITALS — BP 132/68 | HR 86 | Temp 98.4°F

## 2012-07-01 DIAGNOSIS — C61 Malignant neoplasm of prostate: Secondary | ICD-10-CM

## 2012-07-01 DIAGNOSIS — C7951 Secondary malignant neoplasm of bone: Secondary | ICD-10-CM

## 2012-07-01 MED ORDER — PEGFILGRASTIM INJECTION 6 MG/0.6ML
6.0000 mg | Freq: Once | SUBCUTANEOUS | Status: AC
  Administered 2012-07-01: 6 mg via SUBCUTANEOUS
  Filled 2012-07-01: qty 0.6

## 2012-07-01 NOTE — Patient Instructions (Signed)

## 2012-07-21 ENCOUNTER — Other Ambulatory Visit (HOSPITAL_BASED_OUTPATIENT_CLINIC_OR_DEPARTMENT_OTHER): Payer: Medicare Other | Admitting: Lab

## 2012-07-21 ENCOUNTER — Other Ambulatory Visit: Payer: Self-pay | Admitting: Internal Medicine

## 2012-07-21 ENCOUNTER — Ambulatory Visit (HOSPITAL_BASED_OUTPATIENT_CLINIC_OR_DEPARTMENT_OTHER): Payer: Medicare Other

## 2012-07-21 ENCOUNTER — Telehealth: Payer: Self-pay | Admitting: *Deleted

## 2012-07-21 ENCOUNTER — Ambulatory Visit (HOSPITAL_BASED_OUTPATIENT_CLINIC_OR_DEPARTMENT_OTHER): Payer: Medicare Other | Admitting: Oncology

## 2012-07-21 ENCOUNTER — Telehealth: Payer: Self-pay | Admitting: Oncology

## 2012-07-21 VITALS — BP 118/76 | HR 76 | Temp 97.4°F | Resp 18 | Ht 62.0 in | Wt 206.2 lb

## 2012-07-21 DIAGNOSIS — C61 Malignant neoplasm of prostate: Secondary | ICD-10-CM

## 2012-07-21 DIAGNOSIS — C7951 Secondary malignant neoplasm of bone: Secondary | ICD-10-CM

## 2012-07-21 DIAGNOSIS — C7952 Secondary malignant neoplasm of bone marrow: Secondary | ICD-10-CM

## 2012-07-21 DIAGNOSIS — E291 Testicular hypofunction: Secondary | ICD-10-CM

## 2012-07-21 DIAGNOSIS — Z5111 Encounter for antineoplastic chemotherapy: Secondary | ICD-10-CM

## 2012-07-21 DIAGNOSIS — G893 Neoplasm related pain (acute) (chronic): Secondary | ICD-10-CM

## 2012-07-21 LAB — COMPREHENSIVE METABOLIC PANEL (CC13)
ALT: 41 U/L (ref 0–55)
AST: 41 U/L — ABNORMAL HIGH (ref 5–34)
Alkaline Phosphatase: 258 U/L — ABNORMAL HIGH (ref 40–150)
CO2: 21 mEq/L — ABNORMAL LOW (ref 22–29)
Creatinine: 0.6 mg/dL — ABNORMAL LOW (ref 0.7–1.3)
Sodium: 139 mEq/L (ref 136–145)
Total Bilirubin: 0.49 mg/dL (ref 0.20–1.20)
Total Protein: 6.6 g/dL (ref 6.4–8.3)

## 2012-07-21 LAB — CBC WITH DIFFERENTIAL/PLATELET
BASO%: 1.1 % (ref 0.0–2.0)
EOS%: 0.8 % (ref 0.0–7.0)
Eosinophils Absolute: 0.1 10*3/uL (ref 0.0–0.5)
MCHC: 31.2 g/dL — ABNORMAL LOW (ref 32.0–36.0)
MCV: 91.1 fL (ref 79.3–98.0)
MONO%: 9.4 % (ref 0.0–14.0)
NEUT#: 6.6 10*3/uL — ABNORMAL HIGH (ref 1.5–6.5)
RBC: 4.05 10*6/uL — ABNORMAL LOW (ref 4.20–5.82)
RDW: 20.7 % — ABNORMAL HIGH (ref 11.0–14.6)
WBC: 10.3 10*3/uL (ref 4.0–10.3)
nRBC: 1 % — ABNORMAL HIGH (ref 0–0)

## 2012-07-21 MED ORDER — SODIUM CHLORIDE 0.9 % IV SOLN
75.0000 mg/m2 | Freq: Once | INTRAVENOUS | Status: AC
  Administered 2012-07-21: 140 mg via INTRAVENOUS
  Filled 2012-07-21: qty 14

## 2012-07-21 MED ORDER — SODIUM CHLORIDE 0.9 % IV SOLN
Freq: Once | INTRAVENOUS | Status: AC
  Administered 2012-07-21: 10:00:00 via INTRAVENOUS

## 2012-07-21 MED ORDER — ONDANSETRON 8 MG/50ML IVPB (CHCC)
8.0000 mg | Freq: Once | INTRAVENOUS | Status: AC
  Administered 2012-07-21: 8 mg via INTRAVENOUS

## 2012-07-21 MED ORDER — DEXAMETHASONE SODIUM PHOSPHATE 10 MG/ML IJ SOLN
10.0000 mg | Freq: Once | INTRAMUSCULAR | Status: AC
  Administered 2012-07-21: 10 mg via INTRAVENOUS

## 2012-07-21 NOTE — Patient Instructions (Addendum)
[facility] Discharge Instructions for Patients Receiving Chemotherapy  Today you received the following chemotherapy agents Taxotere  To help prevent nausea and vomiting after your treatment, we encourage you to take your nausea medication zofran 8mg  or compazine 10mg  Begin taking zofran at 6:00pm if neededand the compazine at anytime upondischarge and take it as often as prescribed for the next 72 hours or as needed.   If you develop nausea and vomiting that is not controlled by your nausea medication, call the clinic. If it is after clinic hours your family physician or the after hours number for the clinic or go to the Emergency Department.   BELOW ARE SYMPTOMS THAT SHOULD BE REPORTED IMMEDIATELY:  *FEVER GREATER THAN 100.5 F  *CHILLS WITH OR WITHOUT FEVER  NAUSEA AND VOMITING THAT IS NOT CONTROLLED WITH YOUR NAUSEA MEDICATION  *UNUSUAL SHORTNESS OF BREATH  *UNUSUAL BRUISING OR BLEEDING  TENDERNESS IN MOUTH AND THROAT WITH OR WITHOUT PRESENCE OF ULCERS  *URINARY PROBLEMS  *BOWEL PROBLEMS  UNUSUAL RASH Items with * indicate a potential emergency and should be followed up as soon as possible.  Please call to let a nurse know about any problems that you may have experienced. Feel free to call the clinic you have any questions or concerns. The clinic phone number is [phone].   I have been informed and understand all the instructions given to me. I know to contact the clinic, my physician, or go to the Emergency Department if any problems should occur. I do not have any questions at this time, but understand that I may call the clinic during office hours   should I have any questions or need assistance in obtaining follow up care.    __________________________________________  _____________  __________ Signature of Patient or Authorized Representative            Date                   Time    __________________________________________ Nurse's  Signature

## 2012-07-21 NOTE — Progress Notes (Signed)
Please disregard previous d/c note for 11:43 am.  Patient discharged alone and ambulatory at 1215 to home.

## 2012-07-21 NOTE — Progress Notes (Deleted)
Discharged alone at 11:43 ambulating well.  Going out to have lunch with his wife.

## 2012-07-21 NOTE — Progress Notes (Signed)
Hematology and Oncology Follow Up Visit  Jeff Jimenez [id number] 1945-12-15 67 y.o. 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  Gleason score 5+4=9 in 5 out of the 8 cores, PSA 15.  Prior Therapy: He was started on Lupron and Casodex for his prostate cancer. His PSA nadir down to 0.02 back in March of 2012. In July of 2012, his PSA was up to 0.6. Casodex was stopped temporarily up until November of 2012. His PSA went up to 0.09. Casodex was restarted in November of 2012. In March of 2013, his PSA was 0.27 and most recently, his PSA was up to 0.76 with a castrate level of testosterone of 17.7. He received Zytiga 01/15/12 through 04/21/12 and this was stopped due to a rising PSA.  Current therapy: Taxotere chemotherapy with once daily prednisone started on 04/28/12. He is here for cycle 5 today. Lupron and monthly Xgeva given  at [facility] urology.  Interim History: 67 year old presents today for a follow up visit. Jeff Jimenez is S/P Taxotere for 4 cycles now. States he is eating better and has more energy and gained close to 20 lbs on Prednisone. His nausea is controlled with antiemetics. No vomiting. He is still very functional, is able to drive, perform most activities of daily living. His urine flow is reasonable with the help of Flomax, without it he has had some urine stream difficulties.He has not had any deterioration in his quality of life. No increase in his bone pain. Using Oxycontin TID and Oxycodone for breakthrough pain.No recent hospitalizations or illnesses. No neuropathy noted at this time. He reports slight increase bone pain after neulasta.    Medications: I have reviewed the patient's current medications. Current outpatient prescriptions:Alum & Mag Hydroxide-Simeth (MAGIC MOUTHWASH) SOLN, Take 5 mLs by mouth 3 (three) times daily., Disp: 60 mL, Rfl: 1;  amLODipine (NORVASC)  10 MG tablet, TAKE 1 TABLET EVERY DAY, Disp: 30 tablet, Rfl: 5;  atenolol (TENORMIN) 50 MG tablet, TAKE 1.5 TABLETS (75 MG TOTAL) BY MOUTH DAILY., Disp: 45 tablet, Rfl: 5 Calcium Carbonate-Vitamin D (CALCIUM 600+D) 600-400 MG-UNIT per tablet, Take 2 tablets by mouth daily. , Disp: , Rfl: ;  Denosumab (XGEVA [place]), Inject 120 mg into the skin every 30 (thirty) days. Patient receives injection at urologists office, Disp: , Rfl: ;  docusate sodium (COLACE) 100 MG capsule, Take 300 mg by mouth daily. , Disp: , Rfl:  leuprolide (LUPRON DEPOT) 22.5 MG injection, Inject 22.5 mg into the muscle every 3 (three) months. Or as directed by urology, Disp: 1 each, Rfl: 0;  ondansetron (ZOFRAN) 8 MG tablet, Take 1 tablet (8 mg total) by mouth every 8 (eight) hours as needed for nausea., Disp: 20 tablet, Rfl: 0;  oxycodone (OXY-IR) 5 MG capsule, Take 10 mg by mouth every 4 (four) hours as needed., Disp: , Rfl:  oxyCODONE (OXYCONTIN) 20 MG 12 hr tablet, Take 1 tablet (20 mg total) by mouth 3 (three) times daily. Take 1 tablet three times a day, Disp: 90 tablet, Rfl: 0;  predniSONE (DELTASONE) 5 MG tablet, Take 1 tablet (5 mg total) by mouth daily., Disp: 60 tablet, Rfl: 1;  prochlorperazine (COMPAZINE) 10 MG tablet, Take 1 tablet (10 mg total) by mouth every 6 (six) hours as needed., Disp: 60 tablet, Rfl: 1 Tamsulosin HCl (FLOMAX) 0.4 MG CAPS, Take 0.4 mg by mouth daily. , Disp: , Rfl:  Allergies: No Known Allergies  Past Medical History, Surgical history, Social history, and Family History were reviewed and updated.  Review of Systems: Constitutional:  Negative for fever, chills, night sweats, anorexia, weight loss, pain. Cardiovascular: no chest pain or dyspnea on exertion Respiratory: negative Neurological: negative Dermatological: negative ENT: negative Skin: Negative. Gastrointestinal: negative Genito-Urinary: negative Hematological and Lymphatic: negative Breast: negative Musculoskeletal:  negative Remaining ROS negative.  Physical Exam: Blood pressure 118/76, pulse 76, temperature 97.4 F (36.3 C), temperature source Oral, resp. rate 18, height 5\' 2"  (1.575 m), weight 206 lb 3.2 oz (93.532 kg). ECOG: 1 General appearance: alert Head: Normocephalic, without obvious abnormality, atraumatic Neck: no adenopathy, no carotid bruit, no JVD, supple, symmetrical, trachea midline and thyroid not enlarged, symmetric, no tenderness/mass/nodules Lymph nodes: Cervical, supraclavicular, and axillary nodes normal. Heart:regular rate and rhythm, S1, S2 normal, no murmur, click, rub or gallop Lung:chest clear, no wheezing, rales, normal symmetric air entry Abdomen: soft, non-tender, without masses or organomegaly EXT:no erythema, induration, or nodules   Lab Results: Lab Results  Component Value Date   WBC 10.3 07/21/2012   HGB 11.5* 07/21/2012   HCT 36.9* 07/21/2012   MCV 91.1 07/21/2012   PLT 242 07/21/2012     Chemistry      Component Value Date/Time   NA 138 06/30/2012 0908   NA 140 11/01/2011 1325   K 4.2 06/30/2012 0908   K 3.9 11/01/2011 1325   CL 108* 06/30/2012 0908   CL 104 11/01/2011 1325   CO2 20* 06/30/2012 0908   CO2 28 11/01/2011 1325   BUN 12.7 06/30/2012 0908   BUN 9 11/01/2011 1325   CREATININE 0.6* 06/30/2012 0908   CREATININE 0.60 11/01/2011 1325      Component Value Date/Time   CALCIUM 6.9* 06/30/2012 0908   CALCIUM 9.1 11/01/2011 1325   ALKPHOS 372* 06/30/2012 0908   ALKPHOS 101 11/01/2011 1325   AST 42* 06/30/2012 0908   AST 34 11/01/2011 1325   ALT 46 06/30/2012 0908   ALT 37 11/01/2011 1325   BILITOT 0.48 06/30/2012 0908   BILITOT 0.4 11/01/2011 1325       Results for Jeff, Jimenez (MRN [id number]) as of 07/21/2012 08:41  Ref. Range 05/19/2012 10:19 06/09/2012 07:52 06/30/2012 09:09  PSA Latest Range: <=4.00 ng/mL 47.65 (H) 27.57 (H) 27.06 (H)    Impression and Plan: This is a pleasant 67 year old gentleman with the following issues:   1. Prostate cancer. His  diagnosis dates back to October of 2010. He had a prostate-specific antigen of 15, Gleason score of 5+ 4=9. He has developed castration-resistant disease. He recently started on Taxotere without major complications. His PSA is declining slowly. Recommend that he proceed with cycle 5 of Taxotere today.   2. Bony disease. Continue Xgeva at [facility].   3. Androgen deprivation.  I will recommend continuing that for the time being. Due April 2014.  4. Pain. His bony pain is under reasonable control with OxyContin and oxycodone. He received a refill of OxyContin today to take every 8 hours.   5. IV access: I discussed the risks and benefits of a port today and he is agreeable if we are to continue with this chemotherapy.   6. Neutropenia prophylaxis: On Neulasta with each cycle of chemo.  7. Nausea/vomiting. Continue Zofran. Much improved now.  8. Weight gain: likely due to prednisone. I asked him to take it evey other day.   9. Follow-up: 3 weeks for cycle 6 of chemotherapy.  Lander Eslick 4/1/20148:39 AM

## 2012-07-21 NOTE — Telephone Encounter (Signed)
Gave pt appt for lab, ML and chemo then MAy 2014 lab, MD and chemo

## 2012-07-21 NOTE — Telephone Encounter (Signed)
Per staff message and POF I have scheduled appts.  JMW  

## 2012-07-22 ENCOUNTER — Ambulatory Visit (HOSPITAL_BASED_OUTPATIENT_CLINIC_OR_DEPARTMENT_OTHER): Payer: Medicare Other

## 2012-07-22 ENCOUNTER — Other Ambulatory Visit: Payer: Self-pay | Admitting: Oncology

## 2012-07-22 VITALS — BP 140/73 | HR 88 | Temp 98.2°F

## 2012-07-22 DIAGNOSIS — C61 Malignant neoplasm of prostate: Secondary | ICD-10-CM

## 2012-07-22 DIAGNOSIS — C7952 Secondary malignant neoplasm of bone marrow: Secondary | ICD-10-CM

## 2012-07-22 DIAGNOSIS — Z5189 Encounter for other specified aftercare: Secondary | ICD-10-CM

## 2012-07-22 MED ORDER — PEGFILGRASTIM INJECTION 6 MG/0.6ML
6.0000 mg | Freq: Once | SUBCUTANEOUS | Status: AC
  Administered 2012-07-22: 6 mg via SUBCUTANEOUS
  Filled 2012-07-22: qty 0.6

## 2012-07-23 ENCOUNTER — Other Ambulatory Visit (INDEPENDENT_AMBULATORY_CARE_PROVIDER_SITE_OTHER): Payer: Medicare Other

## 2012-07-23 ENCOUNTER — Ambulatory Visit (INDEPENDENT_AMBULATORY_CARE_PROVIDER_SITE_OTHER): Payer: Medicare Other | Admitting: Internal Medicine

## 2012-07-23 ENCOUNTER — Encounter: Payer: Self-pay | Admitting: Internal Medicine

## 2012-07-23 VITALS — BP 122/78 | HR 91 | Temp 99.2°F | Wt 189.4 lb

## 2012-07-23 DIAGNOSIS — C61 Malignant neoplasm of prostate: Secondary | ICD-10-CM

## 2012-07-23 DIAGNOSIS — I10 Essential (primary) hypertension: Secondary | ICD-10-CM

## 2012-07-23 DIAGNOSIS — R739 Hyperglycemia, unspecified: Secondary | ICD-10-CM

## 2012-07-23 DIAGNOSIS — F411 Generalized anxiety disorder: Secondary | ICD-10-CM

## 2012-07-23 DIAGNOSIS — R7309 Other abnormal glucose: Secondary | ICD-10-CM

## 2012-07-23 DIAGNOSIS — Z0000 Encounter for general adult medical examination without abnormal findings: Secondary | ICD-10-CM

## 2012-07-23 LAB — LIPID PANEL
Total CHOL/HDL Ratio: 5
Triglycerides: 201 mg/dL — ABNORMAL HIGH (ref 0.0–149.0)

## 2012-07-23 NOTE — Assessment & Plan Note (Signed)
Elevated sugar likely related to prednisone resumed January 2014 with reinitiation of chemotherapy Check A1c now, notes decreased prednisone dose 07/21/2012 advised The patient is asked to make an attempt to improve diet and exercise patterns to aid in medical management of this problem.

## 2012-07-23 NOTE — Assessment & Plan Note (Signed)
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   Current therapy:  Taxotere chemotherapy with once daily prednisone started on 04/28/12. cycle 5 done 07/21/12.  Lupron and monthly Xgeva given at [facility] urology.

## 2012-07-23 NOTE — Progress Notes (Signed)
Subjective:    Patient ID: Jeff Jimenez, male    DOB: 10-15-1945, 67 y.o.   MRN: [id number]  HPI  Here for medicare wellness  Diet: heart healthy  Physical activity: sedentary Depression/mood screen: negative Hearing: intact to whispered voice Visual acuity: grossly normal, performs annual eye exam  ADLs: capable Fall risk: none Home safety: good Cognitive evaluation: intact to orientation, naming, recall and repetition EOL planning: adv directives, full code/ I agree  I have personally reviewed and have noted 1. The patient's medical and social history 2. Their use of alcohol, tobacco or illicit drugs 3. Their current medications and supplements 4. The patient's functional ability including ADL's, fall risks, home safety risks and hearing or visual impairment. 5. Diet and physical activities 6. Evidence for depression or mood disorders  Also here for 6 month followup - reviewed chronic medical issues:  HTN - home BP reviewed: range SBP 130-150s on amlodipine 10mg  once daily with atenolol- BP readings exac by anxiety and pain - no side effects related to med tx  - no edema,chest pain or headache   met prostate cancer, dx 2010 reports increasing PSA- reviewed interval oncology and urology notes - s/p XRT to spine mets and 2010 - only pain mgmt by uro - overall controlled Also on Lupron every 6 months, changed Casodex to [facility] 12/2011 walking with walker only as needed (long exertion out of house)  anxiety - uses as needed valium, no increase in dose or frequency reports compliance with ongoing medical treatment and no changes in medication dose or frequency. denies adverse side effects related to current therapy.   Past Medical History  Diagnosis Date  . Shingles 06/2009  . BPH (benign prostatic hyperplasia)   . DIVERTICULITIS, HX OF 2010  . ANEMIA-NOS   . ANXIETY   . HYPERTENSION   . Prostate cancer, primary, with metastasis from prostate to other site 01/2009 dx    T-L spine mets, pelvic adenopathy   Family History  Problem Relation Age of Onset  . Hypertension Mother   . Glaucoma Mother   . Heart disease Father   . Hypertension Father    History  Substance Use Topics  . Smoking status: Never Smoker   . Smokeless tobacco: Not on file     Comment: Minister, retired - Married, lives with wife  . Alcohol Use: No    Review of Systems Constitutional: Negative for fever or weight change.  Respiratory: Negative for cough and shortness of breath.   Cardiovascular: Negative for chest pain or palpitations.  Gastrointestinal: Negative for abdominal pain, no bowel changes.  Musculoskeletal: Negative for gait problem or joint swelling.  Skin: Negative for rash.  Neurological: Negative for dizziness or headache.  No other specific complaints in a complete review of systems (except as listed in HPI above).     Objective:   Physical Exam  BP 122/78  Pulse 91  Temp(Src) 99.2 F (37.3 C) (Oral)  Wt 189 lb 6.4 oz (85.911 kg)  BMI 34.63 kg/m2  SpO2 95% Wt Readings from Last 3 Encounters:  07/23/12 189 lb 6.4 oz (85.911 kg)  07/21/12 206 lb 3.2 oz (93.532 kg)  06/30/12 189 lb 14.4 oz (86.138 kg)   Constitutional: He is well-developed and well-nourished. No distress.  Overweight.   Cardiovascular: Normal rate, regular rhythm and normal heart sounds. No murmur heard. no BLE edema Pulmonary/Chest: Effort normal and breath sounds normal. No respiratory distress. He has no wheezes.  Musculoskeletal:  Back: full range of  motion of thoracic and lumbar spine. Non tender to palpation. Sensation intact in all dermatomes of the lower extremities. Full strength to manual muscle testing. the patient is able to heel toe walk without difficulty and ambulates with a normal gait. Neurological: He is alert and oriented to person, place, and time. Coordination normal.   Lab Results  Component Value Date   WBC 10.3 07/21/2012   HGB 11.5* 07/21/2012   HCT 36.9*  07/21/2012   PLT 242 07/21/2012   GLUCOSE 122* 07/21/2012   CHOL 171 01/30/2011   TRIG 220.0* 01/30/2011   HDL 38.20* 01/30/2011   LDLDIRECT 98.3 01/30/2011   LDLCALC 112* 01/31/2010   ALT 41 07/21/2012   AST 41* 07/21/2012   NA 139 07/21/2012   K 4.3 07/21/2012   CL 108* 07/21/2012   CREATININE 0.6* 07/21/2012   BUN 11.5 07/21/2012   CO2 21* 07/21/2012   TSH 1.44 01/30/2011   PSA 27.52* 07/21/2012   INR 1.16 01/28/2009   HGBA1C  Value: 6.0 (NOTE) The ADA recommends the following therapeutic goal for glycemic control related to Hgb A1c measurement: Goal of therapy: <6.5 Hgb A1c  Reference: American Diabetes Association: Clinical Practice Recommendations 2010, Diabetes Care, 2010, 33: (Suppl  1). 01/30/2009        Assessment & Plan:  AWV/v70.0 - Today patient counseled on age appropriate routine health concerns for screening and prevention, each reviewed and up to date or declined. Immunizations reviewed and up to date or declined. Labs/ECG reviewed. Risk factors for depression reviewed and negative. Hearing function and visual acuity are intact. ADLs screened and addressed as needed. Functional ability and level of safety reviewed and appropriate. Education, counseling and referrals performed based on assessed risks today. Patient provided with a copy of personalized plan for preventive services.  Also see problem list. Medications and labs reviewed today.

## 2012-07-23 NOTE — Assessment & Plan Note (Signed)
The current medical regimen is effective;  continue present plan and medications.  

## 2012-07-23 NOTE — Patient Instructions (Addendum)
It was good to see you today. We have reviewed your interval records including labs and tests today Health Maintenance reviewed - all recommended immunizations and age-appropriate screenings are up-to-date. Test(s) ordered today. Your results will be released to MyChart (or called to you) after review, usually within 72hours after test completion. If any changes need to be made, you will be notified at that same time. Medications reviewed and updated, no changes at this time. Please schedule followup in 6 months, call sooner if problems. We reviewed the MOST form sample today - please let me know if you want a offical copy of this signed for you and your records  Health Maintenance, Males A healthy lifestyle and preventative care can promote health and wellness.  Maintain regular health, dental, and eye exams.  Eat a healthy diet. Foods like vegetables, fruits, whole grains, low-fat dairy products, and lean protein foods contain the nutrients you need without too many calories. Decrease your intake of foods high in solid fats, added sugars, and salt. Get information about a proper diet from your caregiver, if necessary.  Regular physical exercise is one of the most important things you can do for your health. Most adults should get at least 150 minutes of moderate-intensity exercise (any activity that increases your heart rate and causes you to sweat) each week. In addition, most adults need muscle-strengthening exercises on 2 or more days a week.   Maintain a healthy weight. The body mass index (BMI) is a screening tool to identify possible weight problems. It provides an estimate of body fat based on height and weight. Your caregiver can help determine your BMI, and can help you achieve or maintain a healthy weight. For adults 20 years and older:  A BMI below 18.5 is considered underweight.  A BMI of 18.5 to 24.9 is normal.  A BMI of 25 to 29.9 is considered overweight.  A BMI of 30 and  above is considered obese.  Maintain normal blood lipids and cholesterol by exercising and minimizing your intake of saturated fat. Eat a balanced diet with plenty of fruits and vegetables. Blood tests for lipids and cholesterol should begin at age 66 and be repeated every 5 years. If your lipid or cholesterol levels are high, you are over 50, or you are a high risk for heart disease, you may need your cholesterol levels checked more frequently.Ongoing high lipid and cholesterol levels should be treated with medicines, if diet and exercise are not effective.  If you smoke, find out from your caregiver how to quit. If you do not use tobacco, do not start.  If you choose to drink alcohol, do not exceed 2 drinks per day. One drink is considered to be 12 ounces (355 mL) of beer, 5 ounces (148 mL) of wine, or 1.5 ounces (44 mL) of liquor.  Avoid use of street drugs. Do not share needles with anyone. Ask for help if you need support or instructions about stopping the use of drugs.  High blood pressure causes heart disease and increases the risk of stroke. Blood pressure should be checked at least every 1 to 2 years. Ongoing high blood pressure should be treated with medicines if weight loss and exercise are not effective.  If you are 45 to 67 years old, ask your caregiver if you should take aspirin to prevent heart disease.  Diabetes screening involves taking a blood sample to check your fasting blood sugar level. This should be done once every 3 years, after age  45, if you are within normal weight and without risk factors for diabetes. Testing should be considered at a younger age or be carried out more frequently if you are overweight and have at least 1 risk factor for diabetes.  Colorectal cancer can be detected and often prevented. Most routine colorectal cancer screening begins at the age of 101 and continues through age 87. However, your caregiver may recommend screening at an earlier age if you  have risk factors for colon cancer. On a yearly basis, your caregiver may provide home test kits to check for hidden blood in the stool. Use of a small camera at the end of a tube, to directly examine the colon (sigmoidoscopy or colonoscopy), can detect the earliest forms of colorectal cancer. Talk to your caregiver about this at age 33, when routine screening begins. Direct examination of the colon should be repeated every 5 to 10 years through age 72, unless early forms of pre-cancerous polyps or small growths are found.  Hepatitis C blood testing is recommended for all people born from [birth year] through 1965 and any individual with known risks for hepatitis C.  Healthy men should no longer receive prostate-specific antigen (PSA) blood tests as part of routine cancer screening. Consult with your caregiver about prostate cancer screening.  Testicular cancer screening is not recommended for adolescents or adult males who have no symptoms. Screening includes self-exam, caregiver exam, and other screening tests. Consult with your caregiver about any symptoms you have or any concerns you have about testicular cancer.  Practice safe sex. Use condoms and avoid high-risk sexual practices to reduce the spread of sexually transmitted infections (STIs).  Use sunscreen with a sun protection factor (SPF) of 30 or greater. Apply sunscreen liberally and repeatedly throughout the day. You should seek shade when your shadow is shorter than you. Protect yourself by wearing long sleeves, pants, a wide-brimmed hat, and sunglasses year round, whenever you are outdoors.  Notify your caregiver of new moles or changes in moles, especially if there is a change in shape or color. Also notify your caregiver if a mole is larger than the size of a pencil eraser.  A one-time screening for abdominal aortic aneurysm (AAA) and surgical repair of large AAAs by sound wave imaging (ultrasonography) is recommended for ages 19 to 52 years who  are current or former smokers.  Stay current with your immunizations. Document Released: 10/05/2007 Document Revised: 07/01/2011 Document Reviewed: 09/03/2010 [facility] Patient Information 2013 [place], [place]. Exercise to Lose Weight Exercise and a healthy diet may help you lose weight. Your doctor may suggest specific exercises. EXERCISE IDEAS AND TIPS  Choose low-cost things you enjoy doing, such as walking, bicycling, or exercising to workout videos.  Take stairs instead of the elevator.  Walk during your lunch break.  Park your car further away from work or school.  Go to a gym or an exercise class.  Start with 5 to 10 minutes of exercise each day. Build up to 30 minutes of exercise 4 to 6 days a week.  Wear shoes with good support and comfortable clothes.  Stretch before and after working out.  Work out until you breathe harder and your heart beats faster.  Drink extra water when you exercise.  Do not do so much that you hurt yourself, feel dizzy, or get very short of breath. Exercises that burn about 150 calories:  Running 1  miles in 15 minutes.  Playing volleyball for 45 to 60 minutes.  Washing and waxing  a car for 45 to 60 minutes.  Playing touch football for 45 minutes.  Walking 1  miles in 35 minutes.  Pushing a stroller 1  miles in 30 minutes.  Playing basketball for 30 minutes.  Raking leaves for 30 minutes.  Bicycling 5 miles in 30 minutes.  Walking 2 miles in 30 minutes.  Dancing for 30 minutes.  Shoveling snow for 15 minutes.  Swimming laps for 20 minutes.  Walking up stairs for 15 minutes.  Bicycling 4 miles in 15 minutes.  Gardening for 30 to 45 minutes.  Jumping rope for 15 minutes.  Washing windows or floors for 45 to 60 minutes. Document Released: 05/11/2010 Document Revised: 07/01/2011 Document Reviewed: 05/11/2010 [facility] Patient Information 2013 [place], [place].

## 2012-07-23 NOTE — Assessment & Plan Note (Signed)
BP Readings from Last 3 Encounters:  07/23/12 122/78  07/22/12 140/73  07/21/12 118/76   07/2011 increased Bbloc, improved The current medical regimen is effective;  continue present plan and medications.

## 2012-07-27 ENCOUNTER — Other Ambulatory Visit: Payer: Self-pay | Admitting: Internal Medicine

## 2012-08-10 ENCOUNTER — Encounter: Payer: Self-pay | Admitting: Oncology

## 2012-08-10 ENCOUNTER — Other Ambulatory Visit (HOSPITAL_BASED_OUTPATIENT_CLINIC_OR_DEPARTMENT_OTHER): Payer: Medicare Other | Admitting: Lab

## 2012-08-10 ENCOUNTER — Ambulatory Visit (HOSPITAL_BASED_OUTPATIENT_CLINIC_OR_DEPARTMENT_OTHER): Payer: Medicare Other | Admitting: Oncology

## 2012-08-10 ENCOUNTER — Encounter: Payer: Self-pay | Admitting: Lab

## 2012-08-10 VITALS — BP 136/74 | HR 98 | Temp 98.2°F | Resp 18 | Ht 62.0 in | Wt 192.2 lb

## 2012-08-10 DIAGNOSIS — C61 Malignant neoplasm of prostate: Secondary | ICD-10-CM

## 2012-08-10 DIAGNOSIS — G893 Neoplasm related pain (acute) (chronic): Secondary | ICD-10-CM

## 2012-08-10 DIAGNOSIS — C7952 Secondary malignant neoplasm of bone marrow: Secondary | ICD-10-CM

## 2012-08-10 DIAGNOSIS — E291 Testicular hypofunction: Secondary | ICD-10-CM

## 2012-08-10 LAB — COMPREHENSIVE METABOLIC PANEL (CC13)
Albumin: 3.3 g/dL — ABNORMAL LOW (ref 3.5–5.0)
BUN: 8.8 mg/dL (ref 7.0–26.0)
CO2: 22 mEq/L (ref 22–29)
Glucose: 148 mg/dl — ABNORMAL HIGH (ref 70–99)
Potassium: 4.4 mEq/L (ref 3.5–5.1)
Sodium: 140 mEq/L (ref 136–145)
Total Protein: 6.4 g/dL (ref 6.4–8.3)

## 2012-08-10 LAB — CBC WITH DIFFERENTIAL/PLATELET
Basophils Absolute: 0.1 10*3/uL (ref 0.0–0.1)
Eosinophils Absolute: 0.1 10*3/uL (ref 0.0–0.5)
HGB: 11.5 g/dL — ABNORMAL LOW (ref 13.0–17.1)
LYMPH%: 17.7 % (ref 14.0–49.0)
MONO#: 0.6 10*3/uL (ref 0.1–0.9)
NEUT#: 7 10*3/uL — ABNORMAL HIGH (ref 1.5–6.5)
Platelets: 252 10*3/uL (ref 140–400)
RBC: 3.98 10*6/uL — ABNORMAL LOW (ref 4.20–5.82)
RDW: 20.3 % — ABNORMAL HIGH (ref 11.0–14.6)
WBC: 9.4 10*3/uL (ref 4.0–10.3)

## 2012-08-10 LAB — PSA: PSA: 31.6 ng/mL — ABNORMAL HIGH (ref <=4.00)

## 2012-08-10 MED ORDER — OXYCODONE HCL 20 MG PO TB12: 20.0000 mg | ORAL_TABLET | Freq: Three times a day (TID) | ORAL | Status: DC

## 2012-08-10 MED ORDER — OXYCODONE HCL 10 MG PO TABS: 10.0000 mg | ORAL_TABLET | ORAL | Status: DC | PRN

## 2012-08-10 NOTE — Progress Notes (Signed)
Hematology and Oncology Follow Up Visit  Jeff Jimenez [id number] 10-Aug-1945 67 y.o. 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  Gleason score 5+4=9 in 5 out of the 8 cores, PSA 15.  Prior Therapy: He was started on Lupron and Casodex for his prostate cancer. His PSA nadir down to 0.02 back in March of 2012. In July of 2012, his PSA was up to 0.6. Casodex was stopped temporarily up until November of 2012. His PSA went up to 0.09. Casodex was restarted in November of 2012. In March of 2013, his PSA was 0.27 and most recently, his PSA was up to 0.76 with a castrate level of testosterone of 17.7. He received Zytiga 01/15/12 through 04/21/12 and this was stopped due to a rising PSA.  Current therapy: Taxotere chemotherapy with once daily prednisone started on 04/28/12. He is here for cycle 6 today. Lupron and monthly Xgeva given  at [facility].  Interim History: 67 year old presents today for a follow up visit. [name] is S/P Taxotere for 5 cycles now. States he is eating well. He has lost some of the weight he gained due to Prednisone. Now only taking this every other day.  His nausea is controlled with antiemetics. No vomiting. He is still very functional, is able to drive, perform most activities of daily living. Notices more fatigue since last cycle. His urine flow is reasonable with the help of Flomax, without it he has had some urine stream difficulties.He has not had any deterioration in his quality of life. No increase in his bone pain. Using Oxycontin TID and Oxycodone for breakthrough pain. No recent hospitalizations or illnesses. No neuropathy noted at this time. He reports slight increase bone pain after neulasta.    Medications: I have reviewed the patient's current medications. Current outpatient prescriptions:Alum & Mag Hydroxide-Simeth (MAGIC MOUTHWASH) SOLN, Take 5 mLs  by mouth 3 (three) times daily., Disp: 60 mL, Rfl: 1;  amLODipine (NORVASC) 10 MG tablet, TAKE 1 TABLET EVERY DAY, Disp: 30 tablet, Rfl: 5;  atenolol (TENORMIN) 50 MG tablet, TAKE 1.5 TABLETS (75 MG TOTAL) BY MOUTH DAILY., Disp: 45 tablet, Rfl: 5 Calcium Carbonate-Vitamin D (CALCIUM 600+D) 600-400 MG-UNIT per tablet, Take 2 tablets by mouth daily. , Disp: , Rfl: ;  Denosumab (XGEVA Mescal), Inject 120 mg into the skin every 30 (thirty) days. Patient receives injection at urologists office, Disp: , Rfl: ;  docusate sodium (COLACE) 100 MG capsule, Take 300 mg by mouth daily. , Disp: , Rfl:  leuprolide (LUPRON DEPOT) 22.5 MG injection, Inject 22.5 mg into the muscle every 3 (three) months. Or as directed by urology, Disp: 1 each, Rfl: 0;  ondansetron (ZOFRAN) 8 MG tablet, TAKE 1 TABLET BY MOUTH EVERY 8 HOURS AS NEEDED FOR NAUSEA, Disp: 20 tablet, Rfl: 0;  oxyCODONE (OXYCONTIN) 20 MG 12 hr tablet, Take 1 tablet (20 mg total) by mouth 3 (three) times daily. Take 1 tablet three times a day, Disp: 90 tablet, Rfl: 0 Oxycodone HCl 10 MG TABS, Take 1 tablet (10 mg total) by mouth every 4 (four) hours as needed., Disp: 100 tablet, Rfl: 0;  predniSONE (DELTASONE) 5 MG tablet, Take 5 mg by mouth every other day., Disp: , Rfl: ;  prochlorperazine (COMPAZINE) 10 MG tablet, Take 1 tablet (10 mg total) by mouth every 6 (six) hours as needed., Disp: 60 tablet, Rfl: 1;  Tamsulosin HCl (FLOMAX)  0.4 MG CAPS, Take 0.4 mg by mouth daily. , Disp: , Rfl:   Allergies: No Known Allergies  Past Medical History, Surgical history, Social history, and Family History were reviewed and updated.  Review of Systems: Constitutional:  Negative for fever, chills, night sweats, anorexia, weight loss, pain. Cardiovascular: no chest pain or dyspnea on exertion Respiratory: negative Neurological: negative Dermatological: negative ENT: negative Skin: Negative. Gastrointestinal: negative Genito-Urinary: negative Hematological and Lymphatic:  negative Breast: negative Musculoskeletal: negative Remaining ROS negative.  Physical Exam: Blood pressure 136/74, pulse 98, temperature 98.2 F (36.8 C), temperature source Oral, resp. rate 18, height 5\' 2"  (1.575 m), weight 192 lb 3.2 oz (87.181 kg). ECOG: 1 General appearance: alert Head: Normocephalic, without obvious abnormality, atraumatic Neck: no adenopathy, no carotid bruit, no JVD, supple, symmetrical, trachea midline and thyroid not enlarged, symmetric, no tenderness/mass/nodules Lymph nodes: Cervical, supraclavicular, and axillary nodes normal. Heart:regular rate and rhythm, S1, S2 normal, no murmur, click, rub or gallop Lung:chest clear, no wheezing, rales, normal symmetric air entry Abdomen: soft, non-tender, without masses or organomegaly EXT:no erythema, induration, or nodules   Lab Results: Lab Results  Component Value Date   WBC 9.4 08/10/2012   HGB 11.5* 08/10/2012   HCT 35.8* 08/10/2012   MCV 90.0 08/10/2012   PLT 252 08/10/2012     Chemistry      Component Value Date/Time   NA 140 08/10/2012 1115   NA 140 11/01/2011 1325   K 4.4 08/10/2012 1115   K 3.9 11/01/2011 1325   CL 109* 08/10/2012 1115   CL 104 11/01/2011 1325   CO2 22 08/10/2012 1115   CO2 28 11/01/2011 1325   BUN 8.8 08/10/2012 1115   BUN 9 11/01/2011 1325   CREATININE 0.6* 08/10/2012 1115   CREATININE 0.60 11/01/2011 1325      Component Value Date/Time   CALCIUM 7.7* 08/10/2012 1115   CALCIUM 9.1 11/01/2011 1325   ALKPHOS 237* 08/10/2012 1115   ALKPHOS 101 11/01/2011 1325   AST 47* 08/10/2012 1115   AST 34 11/01/2011 1325   ALT 38 08/10/2012 1115   ALT 37 11/01/2011 1325   BILITOT 0.50 08/10/2012 1115   BILITOT 0.4 11/01/2011 1325     Results for CRIS, TALAVERA (MRN [id number]) as of 08/10/2012 11:28  Ref. Range 04/20/2012 09:26 05/19/2012 10:19 06/09/2012 07:52 06/30/2012 09:09 07/21/2012 08:01  PSA Latest Range: <=4.00 ng/mL 49.63 (H) 47.65 (H) 27.57 (H) 27.06 (H) 27.52 (H)    Impression and Plan: This is  a pleasant 67 year old gentleman with the following issues:   1. Prostate cancer. His diagnosis dates back to October of 2010. He had a prostate-specific antigen of 15, Gleason score of 5+ 4=9. He has developed castration-resistant disease. He recently started on Taxotere without major complications. His PSA has ben stable around 27. Recommend that he proceed with cycle 6 of Taxotere tomorrow. PSA is pending.   2. Bony disease. Continue Xgeva at [facility].   3. Androgen deprivation.  I will recommend continuing that for the time being. Last given in April 2014.  4. Pain. His bony pain is under reasonable control with OxyContin and oxycodone. He received a refill of OxyContin and Oxycodone today.   5. IV access: I discussed the risks and benefits of a port today and he is agreeable if we are to continue with this chemotherapy.   6. Neutropenia prophylaxis: On Neulasta with each cycle of chemo.  7. Nausea/vomiting. Continue Zofran. Much improved now.  8. Weight gain: Better since  changing Prednisone to QOD.   9. Follow-up: 3 weeks for cycle 7 of chemotherapy.    Clenton Pare 4/21/20142:02 PM

## 2012-08-11 ENCOUNTER — Other Ambulatory Visit: Payer: Self-pay | Admitting: Oncology

## 2012-08-11 ENCOUNTER — Ambulatory Visit (HOSPITAL_BASED_OUTPATIENT_CLINIC_OR_DEPARTMENT_OTHER): Payer: Medicare Other

## 2012-08-11 ENCOUNTER — Telehealth: Payer: Self-pay | Admitting: *Deleted

## 2012-08-11 ENCOUNTER — Other Ambulatory Visit: Payer: Medicare Other | Admitting: Lab

## 2012-08-11 VITALS — BP 120/72 | HR 74 | Temp 98.8°F

## 2012-08-11 DIAGNOSIS — C7951 Secondary malignant neoplasm of bone: Secondary | ICD-10-CM

## 2012-08-11 DIAGNOSIS — Z5111 Encounter for antineoplastic chemotherapy: Secondary | ICD-10-CM

## 2012-08-11 DIAGNOSIS — C61 Malignant neoplasm of prostate: Secondary | ICD-10-CM

## 2012-08-11 MED ORDER — ONDANSETRON 8 MG/50ML IVPB (CHCC)
8.0000 mg | Freq: Once | INTRAVENOUS | Status: AC
  Administered 2012-08-11: 8 mg via INTRAVENOUS

## 2012-08-11 MED ORDER — SODIUM CHLORIDE 0.9 % IV SOLN
75.0000 mg/m2 | Freq: Once | INTRAVENOUS | Status: AC
  Administered 2012-08-11: 140 mg via INTRAVENOUS
  Filled 2012-08-11: qty 14

## 2012-08-11 MED ORDER — DEXAMETHASONE SODIUM PHOSPHATE 10 MG/ML IJ SOLN
10.0000 mg | Freq: Once | INTRAMUSCULAR | Status: AC
  Administered 2012-08-11: 10 mg via INTRAVENOUS

## 2012-08-11 MED ORDER — SODIUM CHLORIDE 0.9 % IV SOLN
Freq: Once | INTRAVENOUS | Status: AC
  Administered 2012-08-11: 09:00:00 via INTRAVENOUS

## 2012-08-11 MED ORDER — DOCETAXEL CHEMO INJECTION 160 MG/16ML: 75.0000 mg/m2 | Freq: Once | INTRAVENOUS | Status: DC

## 2012-08-11 NOTE — Patient Instructions (Addendum)
[facility] Discharge Instructions for Patients Receiving Chemotherapy  Today you received the following chemotherapy agents: taxotere  To help prevent nausea and vomiting after your treatment, we encourage you to take your nausea medication.  Take it as often as prescribed.     If you develop nausea and vomiting that is not controlled by your nausea medication, call the clinic. If it is after clinic hours your family physician or the after hours number for the clinic or go to the Emergency Department.   BELOW ARE SYMPTOMS THAT SHOULD BE REPORTED IMMEDIATELY:  *FEVER GREATER THAN 100.5 F  *CHILLS WITH OR WITHOUT FEVER  NAUSEA AND VOMITING THAT IS NOT CONTROLLED WITH YOUR NAUSEA MEDICATION  *UNUSUAL SHORTNESS OF BREATH  *UNUSUAL BRUISING OR BLEEDING  TENDERNESS IN MOUTH AND THROAT WITH OR WITHOUT PRESENCE OF ULCERS  *URINARY PROBLEMS  *BOWEL PROBLEMS  UNUSUAL RASH Items with * indicate a potential emergency and should be followed up as soon as possible.  Feel free to call the clinic you have any questions or concerns. The clinic phone number is [phone].   I have been informed and understand all the instructions given to me. I know to contact the clinic, my physician, or go to the Emergency Department if any problems should occur. I do not have any questions at this time, but understand that I may call the clinic during office hours   should I have any questions or need assistance in obtaining follow up care.    __________________________________________  _____________  __________ Signature of Patient or Authorized Representative            Date                   Time    __________________________________________ Nurse's Signature    

## 2012-08-11 NOTE — Telephone Encounter (Signed)
Message copied by Reesa Chew on Tue Aug 11, 2012  3:10 PM ------      Message from: Jeff Jimenez      Created: Tue Aug 11, 2012  2:38 PM       Please call his PSA. Little change. ------

## 2012-08-11 NOTE — Telephone Encounter (Signed)
Gave patient results of PSA done on 08/10/12

## 2012-08-12 ENCOUNTER — Ambulatory Visit: Payer: Medicare Other | Admitting: Oncology

## 2012-08-12 ENCOUNTER — Ambulatory Visit (HOSPITAL_BASED_OUTPATIENT_CLINIC_OR_DEPARTMENT_OTHER): Payer: Medicare Other

## 2012-08-12 VITALS — BP 132/75 | HR 95 | Temp 98.1°F

## 2012-08-12 DIAGNOSIS — C7951 Secondary malignant neoplasm of bone: Secondary | ICD-10-CM

## 2012-08-12 DIAGNOSIS — C61 Malignant neoplasm of prostate: Secondary | ICD-10-CM

## 2012-08-12 MED ORDER — PEGFILGRASTIM INJECTION 6 MG/0.6ML
6.0000 mg | Freq: Once | SUBCUTANEOUS | Status: AC
  Administered 2012-08-12: 6 mg via SUBCUTANEOUS
  Filled 2012-08-12: qty 0.6

## 2012-08-21 ENCOUNTER — Encounter: Payer: Self-pay | Admitting: *Deleted

## 2012-09-01 ENCOUNTER — Other Ambulatory Visit: Payer: Self-pay | Admitting: *Deleted

## 2012-09-01 ENCOUNTER — Telehealth: Payer: Self-pay | Admitting: Oncology

## 2012-09-01 ENCOUNTER — Ambulatory Visit (HOSPITAL_BASED_OUTPATIENT_CLINIC_OR_DEPARTMENT_OTHER): Payer: Medicare Other

## 2012-09-01 ENCOUNTER — Ambulatory Visit (HOSPITAL_BASED_OUTPATIENT_CLINIC_OR_DEPARTMENT_OTHER): Payer: Medicare Other | Admitting: Oncology

## 2012-09-01 ENCOUNTER — Other Ambulatory Visit (HOSPITAL_BASED_OUTPATIENT_CLINIC_OR_DEPARTMENT_OTHER): Payer: Medicare Other | Admitting: Lab

## 2012-09-01 VITALS — BP 130/74 | HR 66 | Temp 98.3°F | Resp 17 | Ht 62.0 in | Wt 194.5 lb

## 2012-09-01 DIAGNOSIS — Z5111 Encounter for antineoplastic chemotherapy: Secondary | ICD-10-CM

## 2012-09-01 DIAGNOSIS — E291 Testicular hypofunction: Secondary | ICD-10-CM

## 2012-09-01 DIAGNOSIS — C7952 Secondary malignant neoplasm of bone marrow: Secondary | ICD-10-CM

## 2012-09-01 DIAGNOSIS — C7951 Secondary malignant neoplasm of bone: Secondary | ICD-10-CM

## 2012-09-01 DIAGNOSIS — C61 Malignant neoplasm of prostate: Secondary | ICD-10-CM

## 2012-09-01 DIAGNOSIS — R52 Pain, unspecified: Secondary | ICD-10-CM

## 2012-09-01 LAB — COMPREHENSIVE METABOLIC PANEL (CC13)
ALT: 28 U/L (ref 0–55)
AST: 39 U/L — ABNORMAL HIGH (ref 5–34)
Albumin: 3.2 g/dL — ABNORMAL LOW (ref 3.5–5.0)
BUN: 9.9 mg/dL (ref 7.0–26.0)
CO2: 20 mEq/L — ABNORMAL LOW (ref 22–29)
Calcium: 7.6 mg/dL — ABNORMAL LOW (ref 8.4–10.4)
Chloride: 108 mEq/L — ABNORMAL HIGH (ref 98–107)
Potassium: 4.1 mEq/L (ref 3.5–5.1)

## 2012-09-01 LAB — CBC WITH DIFFERENTIAL/PLATELET
BASO%: 1.1 % (ref 0.0–2.0)
Eosinophils Absolute: 0.1 10*3/uL (ref 0.0–0.5)
HCT: 34.7 % — ABNORMAL LOW (ref 38.4–49.9)
LYMPH%: 27.1 % (ref 14.0–49.0)
MCHC: 32 g/dL (ref 32.0–36.0)
MONO#: 0.8 10*3/uL (ref 0.1–0.9)
NEUT#: 5.2 10*3/uL (ref 1.5–6.5)
NEUT%: 61.4 % (ref 39.0–75.0)
Platelets: 246 10*3/uL (ref 140–400)
WBC: 8.5 10*3/uL (ref 4.0–10.3)
lymph#: 2.3 10*3/uL (ref 0.9–3.3)
nRBC: 1 % — ABNORMAL HIGH (ref 0–0)

## 2012-09-01 LAB — PSA: PSA: 39.5 ng/mL — ABNORMAL HIGH (ref <=4.00)

## 2012-09-01 MED ORDER — ONDANSETRON 8 MG/50ML IVPB (CHCC)
8.0000 mg | Freq: Once | INTRAVENOUS | Status: AC
  Administered 2012-09-01: 8 mg via INTRAVENOUS

## 2012-09-01 MED ORDER — SODIUM CHLORIDE 0.9 % IJ SOLN
10.0000 mL | INTRAMUSCULAR | Status: DC | PRN
  Filled 2012-09-01: qty 10

## 2012-09-01 MED ORDER — OXYCODONE HCL 10 MG PO TABS: 10.0000 mg | ORAL_TABLET | ORAL | Status: DC | PRN

## 2012-09-01 MED ORDER — DEXAMETHASONE SODIUM PHOSPHATE 10 MG/ML IJ SOLN
10.0000 mg | Freq: Once | INTRAMUSCULAR | Status: AC
  Administered 2012-09-01: 10 mg via INTRAVENOUS

## 2012-09-01 MED ORDER — SODIUM CHLORIDE 0.9 % IJ SOLN
3.0000 mL | INTRAMUSCULAR | Status: DC | PRN
  Filled 2012-09-01: qty 10

## 2012-09-01 MED ORDER — ALTEPLASE 2 MG IJ SOLR
2.0000 mg | Freq: Once | INTRAMUSCULAR | Status: DC | PRN
  Filled 2012-09-01: qty 2

## 2012-09-01 MED ORDER — SODIUM CHLORIDE 0.9 % IV SOLN
Freq: Once | INTRAVENOUS | Status: AC
  Administered 2012-09-01: 10:00:00 via INTRAVENOUS

## 2012-09-01 MED ORDER — OXYCODONE HCL 20 MG PO TB12: 20.0000 mg | ORAL_TABLET | Freq: Three times a day (TID) | ORAL | Status: DC

## 2012-09-01 MED ORDER — SODIUM CHLORIDE 0.9 % IV SOLN
75.0000 mg/m2 | Freq: Once | INTRAVENOUS | Status: AC
  Administered 2012-09-01: 140 mg via INTRAVENOUS
  Filled 2012-09-01: qty 14

## 2012-09-01 NOTE — Progress Notes (Signed)
Hematology and Oncology Follow Up Visit  Jeff Jimenez [id number] November 06, 1945 67 y.o. 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  Gleason score 5+4=9 in 5 out of the 8 cores, PSA 15.  Prior Therapy: He was started on Lupron and Casodex for his prostate cancer. His PSA nadir down to 0.02 back in March of 2012. In July of 2012, his PSA was up to 0.6. Casodex was stopped temporarily up until November of 2012. His PSA went up to 0.09. Casodex was restarted in November of 2012. In March of 2013, his PSA was 0.27 and most recently, his PSA was up to 0.76 with a castrate level of testosterone of 17.7. He received Zytiga 01/15/12 through 04/21/12 and this was stopped due to a rising PSA.  Current therapy: Taxotere chemotherapy with every other day prednisone started on 04/28/12. He is here for cycle seven today. Lupron and monthly Xgeva given at [facility] urology.  Interim History: 67 year old presents today for a follow up visit. Jeff Jimenez is S/P Taxotere for 6 cycles now. States he is eating well and reporting new complications from Taxotere.   His nausea is controlled with antiemetics. No vomiting. He is still very functional, is able to drive, perform most activities of daily living. Notices more fatigue since last cycle. His urine flow is reasonable with the help of Flomax, without it he has had some urine stream difficulties.He has not had any deterioration in his quality of life. No increase in his bone pain. Using Oxycontin TID and Oxycodone for breakthrough pain. No recent hospitalizations or illnesses. No neuropathy noted at this time. He reports slight increase bone pain after neulasta.He reports excessive tearing but manageable at this time.    Medications: I have reviewed the patient's current medications. Current outpatient prescriptions:Alum & Mag Hydroxide-Simeth (MAGIC MOUTHWASH)  SOLN, Take 5 mLs by mouth 3 (three) times daily., Disp: 60 mL, Rfl: 1;  amLODipine (NORVASC) 10 MG tablet, TAKE 1 TABLET EVERY DAY, Disp: 30 tablet, Rfl: 5;  atenolol (TENORMIN) 50 MG tablet, TAKE 1.5 TABLETS (75 MG TOTAL) BY MOUTH DAILY., Disp: 45 tablet, Rfl: 5 Calcium Carbonate-Vitamin D (CALCIUM 600+D) 600-400 MG-UNIT per tablet, Take 2 tablets by mouth daily. , Disp: , Rfl: ;  Denosumab (XGEVA [place]), Inject 120 mg into the skin every 30 (thirty) days. Patient receives injection at urologists office, Disp: , Rfl: ;  docusate sodium (COLACE) 100 MG capsule, Take 300 mg by mouth daily. , Disp: , Rfl:  leuprolide (LUPRON DEPOT) 22.5 MG injection, Inject 22.5 mg into the muscle every 3 (three) months. Or as directed by urology, Disp: 1 each, Rfl: 0;  ondansetron (ZOFRAN) 8 MG tablet, TAKE 1 TABLET BY MOUTH EVERY 8 HOURS AS NEEDED FOR NAUSEA, Disp: 20 tablet, Rfl: 0;  oxyCODONE (OXYCONTIN) 20 MG 12 hr tablet, Take 1 tablet (20 mg total) by mouth 3 (three) times daily. Take 1 tablet three times a day, Disp: 90 tablet, Rfl: 0 Oxycodone HCl 10 MG TABS, Take 1 tablet (10 mg total) by mouth every 4 (four) hours as needed., Disp: 100 tablet, Rfl: 0;  predniSONE (DELTASONE) 5 MG tablet, Take 5 mg by mouth every other day., Disp: , Rfl: ;  prochlorperazine (COMPAZINE) 10 MG tablet, Take 1 tablet (10 mg total) by mouth every 6 (six) hours as needed., Disp: 60 tablet, Rfl: 1;  Tamsulosin HCl (FLOMAX) 0.4 MG CAPS, Take  0.4 mg by mouth daily. , Disp: , Rfl:   Allergies: No Known Allergies  Past Medical History, Surgical history, Social history, and Family History were reviewed and updated.  Review of Systems: Constitutional:  Negative for fever, chills, night sweats, anorexia, weight loss, pain. Cardiovascular: no chest pain or dyspnea on exertion Respiratory: negative Neurological: negative Dermatological: negative ENT: negative Skin: Negative. Gastrointestinal: negative Genito-Urinary: negative Hematological  and Lymphatic: negative Breast: negative Musculoskeletal: negative Remaining ROS negative.  Physical Exam: Blood pressure 130/74, pulse 66, temperature 98.3 F (36.8 C), temperature source Oral, resp. rate 17, height 5\' 2"  (1.575 m), weight 194 lb 8 oz (88.225 kg), SpO2 96.00%. ECOG: 1 General appearance: alert Head: Normocephalic, without obvious abnormality, atraumatic Neck: no adenopathy, no carotid bruit, no JVD, supple, symmetrical, trachea midline and thyroid not enlarged, symmetric, no tenderness/mass/nodules Lymph nodes: Cervical, supraclavicular, and axillary nodes normal. Heart:regular rate and rhythm, S1, S2 normal, no murmur, click, rub or gallop Lung:chest clear, no wheezing, rales, normal symmetric air entry Abdomen: soft, non-tender, without masses or organomegaly EXT:no erythema, induration, or nodules   Lab Results: Lab Results  Component Value Date   WBC 8.5 09/01/2012   HGB 11.1* 09/01/2012   HCT 34.7* 09/01/2012   MCV 92.5 09/01/2012   PLT 246 09/01/2012     Chemistry      Component Value Date/Time   NA 140 08/10/2012 1115   NA 140 11/01/2011 1325   K 4.4 08/10/2012 1115   K 3.9 11/01/2011 1325   CL 109* 08/10/2012 1115   CL 104 11/01/2011 1325   CO2 22 08/10/2012 1115   CO2 28 11/01/2011 1325   BUN 8.8 08/10/2012 1115   BUN 9 11/01/2011 1325   CREATININE 0.6* 08/10/2012 1115   CREATININE 0.60 11/01/2011 1325      Component Value Date/Time   CALCIUM 7.7* 08/10/2012 1115   CALCIUM 9.1 11/01/2011 1325   ALKPHOS 237* 08/10/2012 1115   ALKPHOS 101 11/01/2011 1325   AST 47* 08/10/2012 1115   AST 34 11/01/2011 1325   ALT 38 08/10/2012 1115   ALT 37 11/01/2011 1325   BILITOT 0.50 08/10/2012 1115   BILITOT 0.4 11/01/2011 1325     Results for Jeff Jimenez (MRN [id number]) as of 09/01/2012 08:09  Ref. Range 07/21/2012 08:01 08/10/2012 11:15  PSA Latest Range: <=4.00 ng/mL 27.52 (H) 31.60 (H)     Impression and Plan: This is a pleasant 67 year old gentleman with the  following issues:   1. Prostate cancer. His diagnosis dates back to October of 2010. He had a prostate-specific antigen of 15, Gleason score of 5+ 4=9. He has developed castration-resistant disease. He recently started on Taxotere without major complications. His PSA did go up slightly to 31 but still rather stable. Recommend that he proceed with cycle 7 of Taxotere today. If his PSA remains stable, we will continue with cycle 8 and 9. If his PSA continues to rise, I will switch him to [facility].    2. Bony disease. Continue Xgeva at [facility].   3. Androgen deprivation.  I will recommend continuing that for the time being. Last given in April 2014.  4. Pain. His bony pain is under reasonable control with OxyContin and oxycodone. He received a refill of OxyContin and Oxycodone today.   5. IV access: I discussed the risks and benefits of a port today and he is agreeable. We will set that up for him in the near future.   6. Neutropenia prophylaxis: On Neulasta with  each cycle of chemo.  7. Nausea/vomiting. Continue Zofran. Much improved now.  8. Weight gain: Better since changing Prednisone to QOD.   9. Follow-up: 3 weeks for cycle 8 of chemotherapy.    Dajae Kizer 5/13/20148:41 AM

## 2012-09-01 NOTE — Patient Instructions (Signed)
[facility] Discharge Instructions for Patients Receiving Chemotherapy  Today you received the following chemotherapy agents :  Taxotere  To help prevent nausea and vomiting after your treatment, we encourage you to take your nausea medication    If you develop nausea and vomiting that is not controlled by your nausea medication, call the clinic. If it is after clinic hours your family physician or the after hours number for the clinic or go to the Emergency Department.   BELOW ARE SYMPTOMS THAT SHOULD BE REPORTED IMMEDIATELY:  *FEVER GREATER THAN 100.5 F  *CHILLS WITH OR WITHOUT FEVER  NAUSEA AND VOMITING THAT IS NOT CONTROLLED WITH YOUR NAUSEA MEDICATION  *UNUSUAL SHORTNESS OF BREATH  *UNUSUAL BRUISING OR BLEEDING  TENDERNESS IN MOUTH AND THROAT WITH OR WITHOUT PRESENCE OF ULCERS  *URINARY PROBLEMS  *BOWEL PROBLEMS  UNUSUAL RASH Items with * indicate a potential emergency and should be followed up as soon as possible.   Please let the nurse know about any problems that you may have experienced. Feel free to call the clinic you have any questions or concerns. The clinic phone number is [phone].   I have been informed and understand all the instructions given to me. I know to contact the clinic, my physician, or go to the Emergency Department if any problems should occur. I do not have any questions at this time, but understand that I may call the clinic during office hours   should I have any questions or need assistance in obtaining follow up care.    __________________________________________  _____________  __________ Signature of Patient or Authorized Representative            Date                   Time    __________________________________________ Nurse's Signature

## 2012-09-01 NOTE — Telephone Encounter (Signed)
Gave pt appt for lab, ML, MD, chemo and injections for  May and June 2014

## 2012-09-01 NOTE — Telephone Encounter (Signed)
Per staff message and POF I have scheduled appts.  JMW  

## 2012-09-02 ENCOUNTER — Ambulatory Visit (HOSPITAL_BASED_OUTPATIENT_CLINIC_OR_DEPARTMENT_OTHER): Payer: Medicare Other

## 2012-09-02 VITALS — BP 129/69 | HR 82 | Temp 98.1°F

## 2012-09-02 DIAGNOSIS — C61 Malignant neoplasm of prostate: Secondary | ICD-10-CM

## 2012-09-02 DIAGNOSIS — C7951 Secondary malignant neoplasm of bone: Secondary | ICD-10-CM

## 2012-09-02 MED ORDER — PEGFILGRASTIM INJECTION 6 MG/0.6ML
6.0000 mg | Freq: Once | SUBCUTANEOUS | Status: AC
  Administered 2012-09-02: 6 mg via SUBCUTANEOUS
  Filled 2012-09-02: qty 0.6

## 2012-09-03 ENCOUNTER — Encounter (HOSPITAL_COMMUNITY): Payer: Self-pay | Admitting: Pharmacy Technician

## 2012-09-04 ENCOUNTER — Other Ambulatory Visit: Payer: Self-pay | Admitting: Radiology

## 2012-09-08 ENCOUNTER — Other Ambulatory Visit: Payer: Self-pay | Admitting: *Deleted

## 2012-09-08 ENCOUNTER — Encounter (HOSPITAL_COMMUNITY): Payer: Self-pay

## 2012-09-08 ENCOUNTER — Ambulatory Visit (HOSPITAL_COMMUNITY)
Admission: RE | Admit: 2012-09-08 | Discharge: 2012-09-08 | Disposition: A | Discharge status: Home / Self Care | Payer: Medicare Other | Source: Ambulatory Visit | Attending: Radiology | Admitting: Radiology

## 2012-09-08 ENCOUNTER — Other Ambulatory Visit: Payer: Self-pay | Admitting: Oncology

## 2012-09-08 ENCOUNTER — Ambulatory Visit (HOSPITAL_COMMUNITY)
Admission: RE | Admit: 2012-09-08 | Discharge: 2012-09-08 | Disposition: A | Discharge status: Home / Self Care | Payer: Medicare Other | Source: Ambulatory Visit | Attending: Oncology | Admitting: Oncology

## 2012-09-08 VITALS — BP 103/58 | HR 77 | Temp 98.3°F | Resp 18 | Ht 62.0 in

## 2012-09-08 DIAGNOSIS — C7951 Secondary malignant neoplasm of bone: Secondary | ICD-10-CM | POA: Insufficient documentation

## 2012-09-08 DIAGNOSIS — D72829 Elevated white blood cell count, unspecified: Secondary | ICD-10-CM | POA: Insufficient documentation

## 2012-09-08 DIAGNOSIS — IMO0002 Reserved for concepts with insufficient information to code with codable children: Secondary | ICD-10-CM | POA: Insufficient documentation

## 2012-09-08 DIAGNOSIS — Z79899 Other long term (current) drug therapy: Secondary | ICD-10-CM | POA: Insufficient documentation

## 2012-09-08 DIAGNOSIS — C61 Malignant neoplasm of prostate: Secondary | ICD-10-CM

## 2012-09-08 DIAGNOSIS — I10 Essential (primary) hypertension: Secondary | ICD-10-CM | POA: Insufficient documentation

## 2012-09-08 LAB — APTT: aPTT: 41 seconds — ABNORMAL HIGH (ref 24–37)

## 2012-09-08 LAB — PROTIME-INR: INR: 1.13 (ref 0.00–1.49)

## 2012-09-08 LAB — CBC
Platelets: 168 10*3/uL (ref 150–400)
RBC: 3.71 MIL/uL — ABNORMAL LOW (ref 4.22–5.81)
RDW: 17.1 % — ABNORMAL HIGH (ref 11.5–15.5)
WBC: 19.2 10*3/uL — ABNORMAL HIGH (ref 4.0–10.5)

## 2012-09-08 LAB — URINALYSIS, ROUTINE W REFLEX MICROSCOPIC
Ketones, ur: NEGATIVE mg/dL
Leukocytes, UA: NEGATIVE
Nitrite: NEGATIVE
Protein, ur: NEGATIVE mg/dL
Urobilinogen, UA: 1 mg/dL (ref 0.0–1.0)

## 2012-09-08 MED ORDER — FENTANYL CITRATE 0.05 MG/ML IJ SOLN
INTRAMUSCULAR | Status: AC | PRN
  Administered 2012-09-08 (×2): 100 ug via INTRAVENOUS

## 2012-09-08 MED ORDER — MIDAZOLAM HCL 2 MG/2ML IJ SOLN
INTRAMUSCULAR | Status: AC
  Filled 2012-09-08: qty 4

## 2012-09-08 MED ORDER — FENTANYL CITRATE 0.05 MG/ML IJ SOLN
INTRAMUSCULAR | Status: AC
  Filled 2012-09-08: qty 4

## 2012-09-08 MED ORDER — LIDOCAINE HCL 1 % IJ SOLN
INTRAMUSCULAR | Status: AC
  Filled 2012-09-08: qty 20

## 2012-09-08 MED ORDER — LIDOCAINE-PRILOCAINE 2.5-2.5 % EX CREA: TOPICAL_CREAM | CUTANEOUS | Status: DC | PRN

## 2012-09-08 MED ORDER — CEFAZOLIN SODIUM-DEXTROSE 2-3 GM-% IV SOLR
2.0000 g | INTRAVENOUS | Status: AC
  Administered 2012-09-08: 2 g via INTRAVENOUS
  Filled 2012-09-08: qty 50

## 2012-09-08 MED ORDER — HEPARIN SOD (PORK) LOCK FLUSH 100 UNIT/ML IV SOLN
500.0000 [IU] | Freq: Once | INTRAVENOUS | Status: AC
  Administered 2012-09-08: 500 [IU] via INTRAVENOUS

## 2012-09-08 MED ORDER — MIDAZOLAM HCL 2 MG/2ML IJ SOLN
INTRAMUSCULAR | Status: AC | PRN
  Administered 2012-09-08: 2 mg via INTRAVENOUS
  Administered 2012-09-08: 1 mg via INTRAVENOUS

## 2012-09-08 MED ORDER — SODIUM CHLORIDE 0.9 % IV SOLN: INTRAVENOUS | Status: DC

## 2012-09-08 NOTE — Procedures (Signed)
R IJ Port No complication No blood loss. See complete dictation in [facility] PACS.  

## 2012-09-08 NOTE — H&P (Signed)
Chief Complaint: "I'm here for a port" Referring Physician:Shadad HPI: Jeff Jimenez is an 67 y.o. male with prostate cancer with bony disease. He is starting chemotherapy and needs Port placement. PMHx and meds reviewed. Pt feels ok, no recent illness or fevers.  Past Medical History:  Past Medical History  Diagnosis Date  . Shingles 06/2009  . BPH (benign prostatic hyperplasia)   . DIVERTICULITIS, HX OF 2010  . ANEMIA-NOS   . ANXIETY   . HYPERTENSION   . 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 Left ankle surgery  1968  . Vasectomy  1978    Family History:  Family History  Problem Relation Age of Onset  . Hypertension Mother   . Glaucoma Mother   . Heart disease Father   . Hypertension Father     Social History:  reports that he has never smoked. He does not have any smokeless tobacco history on file. He reports that he does not drink alcohol or use illicit drugs.  Allergies: No Known Allergies  Medications: Calcium Carbonate-Vitamin D (CALCIUM 600+D) 600-400 MG-UNIT per tablet (Taking) Sig - Route: Take 2 tablets by mouth daily. - Oral Class: Historical Med Number of times this order has been changed since signing: 3 Order [address] docusate sodium (COLACE) 100 MG capsule (Taking) Sig - Route: Take 300 mg by mouth daily. - Oral Class: Historical Med Number of times this order has been changed since signing: 3 Order [address] leuprolide (LUPRON DEPOT) 22.5 MG injection (Taking) 1 each 0 01/23/2012 Sig - Route: Inject 22.5 mg into the muscle every 3 (three) months. Or as directed by urology - Intramuscular Class: Historical Med Number of times this order has been changed since signing: 1 Order [address] oxyCODONE (OXYCONTIN) 20 MG 12 hr tablet (Taking) 90 tablet 0 09/01/2012 Sig - Route: Take 1 tablet (20 mg total) by  mouth 3 (three) times daily. Take 1 tablet three times a day - Oral Class: Print Number of times this order has been changed since signing: 1 Order [address] Oxycodone HCl 10 MG TABS (Taking) 100 tablet 0 09/01/2012 Sig - Route: Take 1 tablet (10 mg total) by mouth every 4 (four) hours as needed. - Oral Class: Print Number of times this order has been changed since signing: 1 Order [address] predniSONE (DELTASONE) 5 MG tablet (Taking) 04/21/2012 Sig - Route: Take 5 mg by mouth every other day. - Oral Class: Historical Med Number of times this order has been changed since signing: 1 Order [address] Tamsulosin HCl (FLOMAX) 0.4 MG CAPS (Taking) Sig - Route: Take 0.4 mg by mouth daily. - Oral Class: Historical Med   Please HPI for pertinent positives, otherwise complete 10 system ROS negative.  Physical Exam: BP 124/67  Pulse 83  Temp(Src) 98.4 F (36.9 C) (Oral)  Resp 18  Ht 5\' 2"  (1.575 m)  SpO2 98% There is no weight on file to calculate BMI.   General Appearance:  Alert, cooperative, no distress, appears stated age  Head:  Normocephalic, without obvious abnormality, atraumatic  ENT: Unremarkable  Neck: Supple, symmetrical, trachea midline  Lungs:   Clear to auscultation bilaterally, no w/r/r, respirations unlabored without use of accessory muscles.  Chest Wall:  No tenderness or deformity  Heart:  Regular rate and rhythm, S1, S2 normal, no murmur,  rub or gallop.  Abdomen:   Soft, non-tender, non distended.  Extremities: Extremities normal, atraumatic, no cyanosis or edema  Pulses: 2+ and symmetric  Neurologic: Normal affect, no gross deficits.   Results for orders placed during the hospital encounter of 09/08/12 (from the past 48 hour(s))  CBC     Status: Abnormal   Collection Time    09/08/12  7:50 AM      Result Value Range   WBC 19.2 (*) 4.0 - 10.5 K/uL   RBC 3.71 (*) 4.22 - 5.81 MIL/uL   Hemoglobin 10.6 (*) 13.0 - 17.0 g/dL   HCT 45.4 (*) 09.8 - 11.9 %   MCV 91.9  78.0 -  100.0 fL   MCH 28.6  26.0 - 34.0 pg   MCHC 31.1  30.0 - 36.0 g/dL   RDW 14.7 (*) 82.9 - 56.2 %   Platelets 168  150 - 400 K/uL   No results found.  Assessment/Plan Prostate cancer Discussed port placement. Explained procedure, risks, complications, use of sedation Elevated WBC, likely from chronic steroid use as no other infectious issues evident. Consent signed in chart  Brayton El PA-C 09/08/2012, 8:09 AM

## 2012-09-08 NOTE — Progress Notes (Signed)
Elevated WBC of uncertain etiology. As a precaution, CXR and UA ordered. Both were normal. D/W ordering MD, Dr. Clelia Croft, who informed us pt received Neulasta with last chemo tx as well, which could contribute to WBC rise. This was not on pt med list. Feel pt appropriate to proceed with port placement.

## 2012-09-15 ENCOUNTER — Other Ambulatory Visit: Payer: Self-pay | Admitting: Oncology

## 2012-09-22 ENCOUNTER — Ambulatory Visit (HOSPITAL_BASED_OUTPATIENT_CLINIC_OR_DEPARTMENT_OTHER): Payer: Medicare Other | Admitting: Oncology

## 2012-09-22 ENCOUNTER — Other Ambulatory Visit (HOSPITAL_BASED_OUTPATIENT_CLINIC_OR_DEPARTMENT_OTHER): Payer: Medicare Other | Admitting: Lab

## 2012-09-22 ENCOUNTER — Encounter: Payer: Self-pay | Admitting: Oncology

## 2012-09-22 ENCOUNTER — Ambulatory Visit (HOSPITAL_BASED_OUTPATIENT_CLINIC_OR_DEPARTMENT_OTHER): Payer: Medicare Other

## 2012-09-22 VITALS — BP 123/67 | HR 81 | Temp 97.9°F | Resp 18 | Ht 62.0 in | Wt 198.9 lb

## 2012-09-22 VITALS — BP 109/71 | HR 61 | Temp 97.9°F | Resp 18

## 2012-09-22 DIAGNOSIS — C61 Malignant neoplasm of prostate: Secondary | ICD-10-CM

## 2012-09-22 DIAGNOSIS — C7951 Secondary malignant neoplasm of bone: Secondary | ICD-10-CM

## 2012-09-22 DIAGNOSIS — M949 Disorder of cartilage, unspecified: Secondary | ICD-10-CM

## 2012-09-22 DIAGNOSIS — Z5111 Encounter for antineoplastic chemotherapy: Secondary | ICD-10-CM

## 2012-09-22 DIAGNOSIS — R112 Nausea with vomiting, unspecified: Secondary | ICD-10-CM

## 2012-09-22 LAB — CBC WITH DIFFERENTIAL/PLATELET
BASO%: 0.9 % (ref 0.0–2.0)
EOS%: 0.6 % (ref 0.0–7.0)
MCH: 29.6 pg (ref 27.2–33.4)
MCHC: 31.6 g/dL — ABNORMAL LOW (ref 32.0–36.0)
NEUT%: 65.3 % (ref 39.0–75.0)
RBC: 3.71 10*6/uL — ABNORMAL LOW (ref 4.20–5.82)
RDW: 17.7 % — ABNORMAL HIGH (ref 11.0–14.6)
lymph#: 2.3 10*3/uL (ref 0.9–3.3)
nRBC: 1 % — ABNORMAL HIGH (ref 0–0)

## 2012-09-22 LAB — COMPREHENSIVE METABOLIC PANEL (CC13)
AST: 45 U/L — ABNORMAL HIGH (ref 5–34)
Albumin: 3 g/dL — ABNORMAL LOW (ref 3.5–5.0)
Alkaline Phosphatase: 133 U/L (ref 40–150)
BUN: 9.6 mg/dL (ref 7.0–26.0)
Potassium: 4.5 mEq/L (ref 3.5–5.1)
Sodium: 137 mEq/L (ref 136–145)

## 2012-09-22 MED ORDER — SODIUM CHLORIDE 0.9 % IJ SOLN
10.0000 mL | INTRAMUSCULAR | Status: DC | PRN
  Administered 2012-09-22: 10 mL
  Filled 2012-09-22: qty 10

## 2012-09-22 MED ORDER — ONDANSETRON 8 MG/50ML IVPB (CHCC)
8.0000 mg | Freq: Once | INTRAVENOUS | Status: AC
  Administered 2012-09-22: 8 mg via INTRAVENOUS

## 2012-09-22 MED ORDER — HEPARIN SOD (PORK) LOCK FLUSH 100 UNIT/ML IV SOLN
500.0000 [IU] | Freq: Once | INTRAVENOUS | Status: AC | PRN
  Administered 2012-09-22: 500 [IU]
  Filled 2012-09-22: qty 5

## 2012-09-22 MED ORDER — SODIUM CHLORIDE 0.9 % IV SOLN
75.0000 mg/m2 | Freq: Once | INTRAVENOUS | Status: AC
  Administered 2012-09-22: 140 mg via INTRAVENOUS
  Filled 2012-09-22: qty 14

## 2012-09-22 MED ORDER — OXYCODONE HCL 10 MG PO TABS: 10.0000 mg | ORAL_TABLET | ORAL | Status: DC | PRN

## 2012-09-22 MED ORDER — SODIUM CHLORIDE 0.9 % IV SOLN
Freq: Once | INTRAVENOUS | Status: AC
  Administered 2012-09-22: 11:00:00 via INTRAVENOUS

## 2012-09-22 MED ORDER — DEXAMETHASONE SODIUM PHOSPHATE 10 MG/ML IJ SOLN
10.0000 mg | Freq: Once | INTRAMUSCULAR | Status: AC
  Administered 2012-09-22: 10 mg via INTRAVENOUS

## 2012-09-22 NOTE — Patient Instructions (Addendum)
[facility] Discharge Instructions for Patients Receiving Chemotherapy  Today you received the following chemotherapy agents Taxotere.  To help prevent nausea and vomiting after your treatment, we encourage you to take your nausea medication.  If you develop nausea and vomiting that is not controlled by your nausea medication, call the clinic. If it is after clinic hours your family physician or the after hours number for the clinic or go to the Emergency Department.   BELOW ARE SYMPTOMS THAT SHOULD BE REPORTED IMMEDIATELY:  *FEVER GREATER THAN 100.5 F  *CHILLS WITH OR WITHOUT FEVER  NAUSEA AND VOMITING THAT IS NOT CONTROLLED WITH YOUR NAUSEA MEDICATION  *UNUSUAL SHORTNESS OF BREATH  *UNUSUAL BRUISING OR BLEEDING  TENDERNESS IN MOUTH AND THROAT WITH OR WITHOUT PRESENCE OF ULCERS  *URINARY PROBLEMS  *BOWEL PROBLEMS  UNUSUAL RASH Items with * indicate a potential emergency and should be followed up as soon as possible.  One of the nurses will contact you 24 hours after your treatment. Please let the nurse know about any problems that you may have experienced. Feel free to call the clinic you have any questions or concerns. The clinic phone number is [phone].   I have been informed and understand all the instructions given to me. I know to contact the clinic, my physician, or go to the Emergency Department if any problems should occur. I do not have any questions at this time, but understand that I may call the clinic during office hours   should I have any questions or need assistance in obtaining follow up care.    __________________________________________  _____________  __________ Signature of Patient or Authorized Representative            Date                   Time    __________________________________________ Nurse's Signature

## 2012-09-22 NOTE — Progress Notes (Signed)
Hematology and Oncology Follow Up Visit  Jeff Jimenez [id number] [birth year] 67 y.o. 09/22/2012 10:29 AM    Principle Diagnosis: 67 year old diagnosed with advanced prostate cancer in 2010 with disease in the pelvic adenopathy and bone disease.  Gleason score 5+4=9 in 5 out of the 8 cores, PSA 15.  Prior Therapy: He was started on Lupron and Casodex for his prostate cancer. His PSA nadir down to 0.02 back in March of 2012. In July of 2012, his PSA was up to 0.6. Casodex was stopped temporarily up until November of 2012. His PSA went up to 0.09. Casodex was restarted in November of 2012. In March of 2013, his PSA was 0.27 and most recently, his PSA was up to 0.76 with a castrate level of testosterone of 17.7. He received Zytiga 01/15/12 through 04/21/12 and this was stopped due to a rising PSA.  Current therapy: Taxotere chemotherapy with every other day prednisone started on 04/28/12. He is here for cycle eight today. Lupron and monthly Xgeva given at [facility] urology.  Interim History: 67 year old presents today for a follow up visit. Jeff Jimenez is S/P Taxotere for 7 cycles now. States he is eating well and reporting new complications from Taxotere.   His nausea is controlled with antiemetics. No vomiting. He is still very functional, is able to drive, perform most activities of daily living. Notices more fatigue since last cycle. His urine flow is reasonable with the help of Flomax, without it he has had some urine stream difficulties.He has not had any deterioration in his quality of life. No increase in his bone pain. Using Oxycontin TID and Oxycodone for breakthrough pain. No recent hospitalizations or illnesses. No neuropathy noted at this time. He reports slight increase bone pain after neulasta.He reports excessive tearing but manageable at this time. PAC has been placed since his last visit. No pain or redness noted at [facility] site.   Medications: I have reviewed the patient's current medications.  Current outpatient prescriptions:amLODipine (NORVASC) 10 MG tablet, Take 10 mg by mouth every evening., Disp: , Rfl: ;  atenolol (TENORMIN) 50 MG tablet, Take 75 mg by mouth every evening., Disp: , Rfl: ;  Calcium Carbonate-Vitamin D (CALCIUM 600+D) 600-400 MG-UNIT per tablet, Take 2 tablets by mouth daily. , Disp: , Rfl:  denosumab (XGEVA) 120 MG/1.7ML SOLN, Inject 120 mg into the skin every 30 (thirty) days. Patient receives injection in drs office, Disp: , Rfl: ;  docusate sodium (COLACE) 100 MG capsule, Take 300 mg by mouth daily. , Disp: , Rfl: ;  leuprolide (LUPRON DEPOT) 22.5 MG injection, Inject 22.5 mg into the muscle every 3 (three) months. Or as directed by urology, Disp: 1 each, Rfl: 0 lidocaine-prilocaine (EMLA) cream, Apply topically as needed. Apply approximately 1/2 teaspoon to skin over port-a-cath. Do not rub in. Place clear plastic over cream. 1-1 and 1/2 hours prior to chemotherapy treatment., Disp: 30 g, Rfl: 2;  ondansetron (ZOFRAN) 8 MG tablet, Take 8 mg by mouth every 8 (eight) hours as needed for nausea., Disp: , Rfl:  oxyCODONE (OXYCONTIN) 20 MG 12 hr tablet, Take 1 tablet (20 mg total) by mouth 3 (three) times daily. Take 1 tablet three times a day, Disp: 90 tablet, Rfl: 0;  Oxycodone HCl 10 MG TABS, Take 1 tablet (10 mg total) by mouth every 4 (four) hours as needed., Disp: 100 tablet, Rfl: 0;  predniSONE (DELTASONE) 5 MG tablet, , Disp: , Rfl: ;  Tamsulosin HCl (FLOMAX) 0.4 MG CAPS, Take 0.4  mg by mouth daily. , Disp: , Rfl:   Allergies: No Known Allergies  Past Medical History, Surgical history, Social history, and Family History were reviewed and updated.  Review of Systems: Constitutional:  Negative for fever, chills, night sweats, anorexia, weight loss, pain. Cardiovascular: no chest pain or dyspnea on exertion Respiratory: negative Neurological: negative Dermatological: negative ENT: negative Skin: Negative. Gastrointestinal: negative Genito-Urinary:  negative Hematological and Lymphatic: negative Breast: negative Musculoskeletal: negative Remaining ROS negative.  Physical Exam: Blood pressure 123/67, pulse 81, temperature 97.9 F (36.6 C), temperature source Oral, resp. rate 18, height 5\' 2"  (1.575 m), weight 198 lb 14.4 oz (90.22 kg), SpO2 96.00%. ECOG: 1 General appearance: alert Head: Normocephalic, without obvious abnormality, atraumatic Neck: no adenopathy, no carotid bruit, no JVD, supple, symmetrical, trachea midline and thyroid not enlarged, symmetric, no tenderness/mass/nodules Lymph nodes: Cervical, supraclavicular, and axillary nodes normal. Heart:regular rate and rhythm, S1, S2 normal, no murmur, click, rub or gallop Lung:chest clear, no wheezing, rales, normal symmetric air entry Abdomen: soft, non-tender, without masses or organomegaly EXT:no erythema, induration, or nodules   Lab Results: Lab Results  Component Value Date   WBC 9.3 09/22/2012   HGB 11.0* 09/22/2012   HCT 34.8* 09/22/2012   MCV 93.8 09/22/2012   PLT 195 09/22/2012     Chemistry      Component Value Date/Time   NA 137 09/01/2012 0741   NA 140 11/01/2011 1325   K 4.1 09/01/2012 0741   K 3.9 11/01/2011 1325   CL 108* 09/01/2012 0741   CL 104 11/01/2011 1325   CO2 20* 09/01/2012 0741   CO2 28 11/01/2011 1325   BUN 9.9 09/01/2012 0741   BUN 9 11/01/2011 1325   CREATININE 0.6* 09/01/2012 0741   CREATININE 0.60 11/01/2011 1325      Component Value Date/Time   CALCIUM 7.6* 09/01/2012 0741   CALCIUM 9.1 11/01/2011 1325   ALKPHOS 132 09/01/2012 0741   ALKPHOS 101 11/01/2011 1325   AST 39* 09/01/2012 0741   AST 34 11/01/2011 1325   ALT 28 09/01/2012 0741   ALT 37 11/01/2011 1325   BILITOT 0.45 09/01/2012 0741   BILITOT 0.4 11/01/2011 1325     Results for Jeff Jimenez, Jeff Jimenez (MRN [id number]) as of 09/22/2012 09:46  Ref. Range 06/09/2012 07:52 06/30/2012 09:09 07/21/2012 08:01 08/10/2012 11:15 09/01/2012 07:41  PSA Latest Range: <=4.00 ng/mL 27.57 (H) 27.06 (H) 27.52 (H) 31.60 (H)  39.50 (H)   Impression and Plan: This is a pleasant 67 year old gentleman with the following issues:   1. Prostate cancer. His diagnosis dates back to October of 2010. He had a prostate-specific antigen of 15, Gleason score of 5+ 4=9. He has developed castration-resistant disease. He recently started on Taxotere without major complications. His PSA is going up slowly from 27 to 39 in 2 months. Plan is to continue with cycle 8 of Taxotere today. If PSA rises today, will switch to Jevtana at his next visit.    2. Bony disease. Continue Xgeva at [facility].   3. Androgen deprivation.  I will recommend continuing that for the time being. Last given in April 2014.  4. Pain. His bony pain is under reasonable control with OxyContin and oxycodone. He received a refill of Oxycodone today.   5. IV access: PAC is place.  6. Neutropenia prophylaxis: On Neulasta with each cycle of chemo.  7. Nausea/vomiting. Continue Zofran. Much improved now.  8. Weight gain: Better since changing Prednisone to QOD.   9. Follow-up: 3 weeks  for cycle 9 of chemotherapy.    [place], Jeff Jimenez 6/3/201410:29 AM

## 2012-09-23 ENCOUNTER — Other Ambulatory Visit: Payer: Self-pay | Admitting: Oncology

## 2012-09-23 ENCOUNTER — Ambulatory Visit (HOSPITAL_BASED_OUTPATIENT_CLINIC_OR_DEPARTMENT_OTHER): Payer: Medicare Other

## 2012-09-23 VITALS — BP 129/70 | HR 91 | Temp 98.6°F

## 2012-09-23 DIAGNOSIS — C7951 Secondary malignant neoplasm of bone: Secondary | ICD-10-CM

## 2012-09-23 DIAGNOSIS — C61 Malignant neoplasm of prostate: Secondary | ICD-10-CM

## 2012-09-23 MED ORDER — PEGFILGRASTIM INJECTION 6 MG/0.6ML
6.0000 mg | Freq: Once | SUBCUTANEOUS | Status: AC
  Administered 2012-09-23: 6 mg via SUBCUTANEOUS
  Filled 2012-09-23: qty 0.6

## 2012-10-13 ENCOUNTER — Telehealth: Payer: Self-pay | Admitting: *Deleted

## 2012-10-13 ENCOUNTER — Telehealth: Payer: Self-pay | Admitting: Oncology

## 2012-10-13 ENCOUNTER — Ambulatory Visit (HOSPITAL_BASED_OUTPATIENT_CLINIC_OR_DEPARTMENT_OTHER): Payer: Medicare Other | Admitting: Oncology

## 2012-10-13 ENCOUNTER — Other Ambulatory Visit: Payer: Medicare Other | Admitting: Lab

## 2012-10-13 ENCOUNTER — Ambulatory Visit: Payer: Medicare Other | Admitting: Oncology

## 2012-10-13 ENCOUNTER — Ambulatory Visit (HOSPITAL_BASED_OUTPATIENT_CLINIC_OR_DEPARTMENT_OTHER): Payer: Medicare Other

## 2012-10-13 ENCOUNTER — Other Ambulatory Visit (HOSPITAL_BASED_OUTPATIENT_CLINIC_OR_DEPARTMENT_OTHER): Payer: Medicare Other | Admitting: Lab

## 2012-10-13 VITALS — BP 118/70 | HR 74 | Temp 97.7°F | Resp 18 | Ht 62.0 in | Wt 195.8 lb

## 2012-10-13 DIAGNOSIS — C7952 Secondary malignant neoplasm of bone marrow: Secondary | ICD-10-CM

## 2012-10-13 DIAGNOSIS — C61 Malignant neoplasm of prostate: Secondary | ICD-10-CM

## 2012-10-13 DIAGNOSIS — Z5111 Encounter for antineoplastic chemotherapy: Secondary | ICD-10-CM

## 2012-10-13 DIAGNOSIS — C7951 Secondary malignant neoplasm of bone: Secondary | ICD-10-CM

## 2012-10-13 LAB — CBC WITH DIFFERENTIAL/PLATELET
Basophils Absolute: 0.1 10*3/uL (ref 0.0–0.1)
EOS%: 0.4 % (ref 0.0–7.0)
Eosinophils Absolute: 0.1 10*3/uL (ref 0.0–0.5)
HGB: 11 g/dL — ABNORMAL LOW (ref 13.0–17.1)
LYMPH%: 22.1 % (ref 14.0–49.0)
MCH: 29.5 pg (ref 27.2–33.4)
MCV: 94.6 fL (ref 79.3–98.0)
MONO%: 8.5 % (ref 0.0–14.0)
Platelets: 184 10*3/uL (ref 140–400)
RBC: 3.73 10*6/uL — ABNORMAL LOW (ref 4.20–5.82)
RDW: 18.3 % — ABNORMAL HIGH (ref 11.0–14.6)

## 2012-10-13 LAB — COMPREHENSIVE METABOLIC PANEL (CC13)
Alkaline Phosphatase: 108 U/L (ref 40–150)
CO2: 22 mEq/L (ref 22–29)
Creatinine: 0.6 mg/dL — ABNORMAL LOW (ref 0.7–1.3)
Glucose: 107 mg/dl — ABNORMAL HIGH (ref 70–99)
Sodium: 140 mEq/L (ref 136–145)
Total Bilirubin: 0.4 mg/dL (ref 0.20–1.20)
Total Protein: 5.9 g/dL — ABNORMAL LOW (ref 6.4–8.3)

## 2012-10-13 LAB — PSA: PSA: 50.01 ng/mL — ABNORMAL HIGH (ref <=4.00)

## 2012-10-13 MED ORDER — FAMOTIDINE IN NACL 20-0.9 MG/50ML-% IV SOLN
20.0000 mg | Freq: Once | INTRAVENOUS | Status: AC
  Administered 2012-10-13: 20 mg via INTRAVENOUS

## 2012-10-13 MED ORDER — SODIUM CHLORIDE 0.9 % IV SOLN
25.0000 mg/m2 | Freq: Once | INTRAVENOUS | Status: AC
  Administered 2012-10-13: 50 mg via INTRAVENOUS
  Filled 2012-10-13: qty 5

## 2012-10-13 MED ORDER — DEXAMETHASONE SODIUM PHOSPHATE 20 MG/5ML IJ SOLN
12.0000 mg | Freq: Once | INTRAMUSCULAR | Status: AC
  Administered 2012-10-13: 12 mg via INTRAVENOUS

## 2012-10-13 MED ORDER — SODIUM CHLORIDE 0.9 % IV SOLN
Freq: Once | INTRAVENOUS | Status: AC
  Administered 2012-10-13: 10:00:00 via INTRAVENOUS

## 2012-10-13 MED ORDER — SODIUM CHLORIDE 0.9 % IJ SOLN
10.0000 mL | INTRAMUSCULAR | Status: DC | PRN
  Administered 2012-10-13: 10 mL
  Filled 2012-10-13: qty 10

## 2012-10-13 MED ORDER — OXYCODONE HCL 10 MG PO TABS: 10.0000 mg | ORAL_TABLET | ORAL | Status: DC | PRN

## 2012-10-13 MED ORDER — ONDANSETRON 8 MG/50ML IVPB (CHCC)
8.0000 mg | Freq: Once | INTRAVENOUS | Status: AC
  Administered 2012-10-13: 8 mg via INTRAVENOUS

## 2012-10-13 MED ORDER — OXYCODONE HCL 20 MG PO TB12: 20.0000 mg | ORAL_TABLET | Freq: Three times a day (TID) | ORAL | Status: DC

## 2012-10-13 MED ORDER — HEPARIN SOD (PORK) LOCK FLUSH 100 UNIT/ML IV SOLN
500.0000 [IU] | Freq: Once | INTRAVENOUS | Status: AC | PRN
  Administered 2012-10-13: 500 [IU]
  Filled 2012-10-13: qty 5

## 2012-10-13 MED ORDER — DIPHENHYDRAMINE HCL 50 MG/ML IJ SOLN
25.0000 mg | Freq: Once | INTRAMUSCULAR | Status: AC
  Administered 2012-10-13: 25 mg via INTRAVENOUS

## 2012-10-13 NOTE — Telephone Encounter (Signed)
Per staff message and POF I have scheduled appts.  JMW  

## 2012-10-13 NOTE — Telephone Encounter (Signed)
Gave pt appt for lab, chemo ML and MD for June and july 2014

## 2012-10-13 NOTE — Progress Notes (Signed)
Hematology and Oncology Follow Up Visit  Jeff Jimenez [id number] [birth year] 67 y.o. 10/13/2012 8:40 AM    Principle Diagnosis: 67 year old diagnosed with advanced prostate cancer in 2010 with disease in the pelvic adenopathy and bone disease.  Gleason score 5+4=9 in 5 out of the 8 cores, PSA 15.  Prior Therapy: He was started on Lupron and Casodex for his prostate cancer. His PSA nadir down to 0.02 back in March of 2012. In July of 2012, his PSA was up to 0.6. Casodex was stopped temporarily up until November of 2012. His PSA went up to 0.09. Casodex was restarted in November of 2012. In March of 2013, his PSA was 0.27 and most recently, his PSA was up to 0.76 with a castrate level of testosterone of 17.7. He received Zytiga 01/15/12 through 04/21/12 and this was stopped due to a rising PSA. Taxotere chemotherapy with every other day prednisone started on 04/28/12. He is S/P 8 cycles completed in 09/2012. PSA started to rise again after a close to 50% response in his PSA (from 49 to 27). Current therapy: He is to start Jevtana cycle one on 10/13/2012. Lupron and monthly Xgeva given at [facility].  Interim History: 67 year old presents today for a follow up visit. Jeff Jimenez is S/P Taxotere for 8 cycles now but his PSA has increased again and we have decided to switch him. States he is eating well and reporting new complications. His nausea is controlled with antiemetics. No vomiting. He is still very functional, is able to drive, perform most activities of daily living. Notices more fatigue since last cycle. His urine flow is reasonable with the help of Flomax, without it he has had some urine stream difficulties. He has not had any deterioration in his quality of life. No increase in his bone pain. Using Oxycontin TID and Oxycodone for breakthrough pain. No recent hospitalizations or illnesses. No neuropathy noted at this time. He reports slight increase bone pain after neulasta. PAC has been placed  since his last visit. No pain or redness noted at [facility] site. It was used without complications last treatment.    Medications: I have reviewed the patient's current medications. Current Outpatient Prescriptions  Medication Sig Dispense Refill  . amLODipine (NORVASC) 10 MG tablet Take 10 mg by mouth every evening.      Marland Kitchen atenolol (TENORMIN) 50 MG tablet Take 75 mg by mouth every evening.      . Calcium Carbonate-Vitamin D (CALCIUM 600+D) 600-400 MG-UNIT per tablet Take 2 tablets by mouth daily.       Marland Kitchen denosumab (XGEVA) 120 MG/1.7ML SOLN Inject 120 mg into the skin every 30 (thirty) days. Patient receives injection in drs office      . docusate sodium (COLACE) 100 MG capsule Take 300 mg by mouth daily.       Marland Kitchen leuprolide (LUPRON DEPOT) 22.5 MG injection Inject 22.5 mg into the muscle every 3 (three) months. Or as directed by urology  1 each  0  . lidocaine-prilocaine (EMLA) cream Apply topically as needed. Apply approximately 1/2 teaspoon to skin over port-a-cath. Do not rub in. Place clear plastic over cream. 1-1 and 1/2 hours prior to chemotherapy treatment.  30 g  2  . ondansetron (ZOFRAN) 8 MG tablet Take 8 mg by mouth every 8 (eight) hours as needed for nausea.      Marland Kitchen oxyCODONE (OXYCONTIN) 20 MG 12 hr tablet Take 1 tablet (20 mg total) by mouth 3 (three) times daily. Take  1 tablet three times a day  90 tablet  0  . Oxycodone HCl 10 MG TABS Take 1 tablet (10 mg total) by mouth every 4 (four) hours as needed.  100 tablet  0  . predniSONE (DELTASONE) 5 MG tablet Take 5 mg by mouth every other day.       . Tamsulosin HCl (FLOMAX) 0.4 MG CAPS Take 0.4 mg by mouth daily.        No current facility-administered medications for this visit.    Allergies: No Known Allergies  Past Medical History, Surgical history, Social history, and Family History were reviewed and updated.  Review of Systems: Constitutional:  Negative for fever, chills, night sweats, anorexia, weight loss,  pain. Cardiovascular: no chest pain or dyspnea on exertion Respiratory: negative Neurological: negative Dermatological: negative ENT: negative Skin: Negative. Gastrointestinal: negative Genito-Urinary: negative Hematological and Lymphatic: negative Breast: negative Musculoskeletal: negative Remaining ROS negative.  Physical Exam: Blood pressure 118/70, pulse 74, temperature 97.7 F (36.5 C), temperature source Oral, resp. rate 18, height 5\' 2"  (1.575 m), weight 195 lb 12.8 oz (88.814 kg). ECOG: 1 General appearance: alert Head: Normocephalic, without obvious abnormality, atraumatic Neck: no adenopathy, no carotid bruit, no JVD, supple, symmetrical, trachea midline and thyroid not enlarged, symmetric, no tenderness/mass/nodules Lymph nodes: Cervical, supraclavicular, and axillary nodes normal. Heart:regular rate and rhythm, S1, S2 normal, no murmur, click, rub or gallop Lung:chest clear, no wheezing, rales, normal symmetric air entry Abdomen: soft, non-tender, without masses or organomegaly EXT:no erythema, induration, or nodules   Lab Results: Lab Results  Component Value Date   WBC 12.0* 10/13/2012   HGB 11.0* 10/13/2012   HCT 35.3* 10/13/2012   MCV 94.6 10/13/2012   PLT 184 10/13/2012     Chemistry      Component Value Date/Time   NA 137 09/22/2012 0904   NA 140 11/01/2011 1325   K 4.5 09/22/2012 0904   K 3.9 11/01/2011 1325   CL 107 09/22/2012 0904   CL 104 11/01/2011 1325   CO2 22 09/22/2012 0904   CO2 28 11/01/2011 1325   BUN 9.6 09/22/2012 0904   BUN 9 11/01/2011 1325   CREATININE 0.6* 09/22/2012 0904   CREATININE 0.60 11/01/2011 1325      Component Value Date/Time   CALCIUM 7.9* 09/22/2012 0904   CALCIUM 9.1 11/01/2011 1325   ALKPHOS 133 09/22/2012 0904   ALKPHOS 101 11/01/2011 1325   AST 45* 09/22/2012 0904   AST 34 11/01/2011 1325   ALT 32 09/22/2012 0904   ALT 37 11/01/2011 1325   BILITOT 0.48 09/22/2012 0904   BILITOT 0.4 11/01/2011 1325     Results for Jeff Jimenez, Jeff Jimenez (MRN  [id number]) as of 10/13/2012 07:44  Ref. Range 07/21/2012 08:01 08/10/2012 11:15 09/01/2012 07:41 09/22/2012 09:04  PSA Latest Range: <=4.00 ng/mL 27.52 (H) 31.60 (H) 39.50 (H) 44.19 (H)    Impression and Plan: This is a pleasant 67 year old gentleman with the following issues:   1. Prostate cancer. His diagnosis dates back to October of 2010. He had a prostate-specific antigen of 15, Gleason score of 5+ 4=9. He has developed castration-resistant disease. He wasTaxotere without major complications but his PSA is going up slowly from 27 to 44 in 3months. Plan is to switch him to [place] starting on 10/13/2012. Risks and benefits of this drug discussed. Complications were explained including diarrhea, myelosuppression, and neuropathy. He is willing to start.  I will restage him with CT scan and bone scan as well before the next visit.  2. Bony disease. Continue Xgeva at [facility].   3. Androgen deprivation.  I will recommend continuing that for the time being. Last given in April 2014.  4. Pain. His bony pain is under reasonable control with OxyContin and oxycodone. He received a refill of Oxycodone today.   5. IV access: PAC is place.  6. Neutropenia prophylaxis: On Neulasta with each cycle of chemo.  7. Nausea/vomiting. Continue Zofran. Much improved now.  8. Weight gain: Better since changing Prednisone to QOD.   9. Follow-up: 3 weeks for cycle 2 of Jevtana.    SHADAD,FIRAS 6/24/20148:40 AM

## 2012-10-14 ENCOUNTER — Ambulatory Visit (HOSPITAL_BASED_OUTPATIENT_CLINIC_OR_DEPARTMENT_OTHER): Payer: Medicare Other

## 2012-10-14 ENCOUNTER — Ambulatory Visit: Payer: Medicare Other | Admitting: Oncology

## 2012-10-14 ENCOUNTER — Other Ambulatory Visit: Payer: Self-pay | Admitting: Oncology

## 2012-10-14 VITALS — BP 134/70 | HR 86 | Temp 98.3°F

## 2012-10-14 DIAGNOSIS — C7951 Secondary malignant neoplasm of bone: Secondary | ICD-10-CM

## 2012-10-14 DIAGNOSIS — C7952 Secondary malignant neoplasm of bone marrow: Secondary | ICD-10-CM

## 2012-10-14 DIAGNOSIS — C61 Malignant neoplasm of prostate: Secondary | ICD-10-CM

## 2012-10-14 MED ORDER — PEGFILGRASTIM INJECTION 6 MG/0.6ML
6.0000 mg | Freq: Once | SUBCUTANEOUS | Status: AC
  Administered 2012-10-14: 6 mg via SUBCUTANEOUS
  Filled 2012-10-14: qty 0.6

## 2012-10-14 NOTE — Patient Instructions (Addendum)

## 2012-10-29 ENCOUNTER — Encounter (HOSPITAL_COMMUNITY)
Admission: RE | Admit: 2012-10-29 | Discharge: 2012-10-29 | Disposition: A | Discharge status: Home / Self Care | Payer: Medicare Other | Source: Ambulatory Visit | Attending: Oncology | Admitting: Oncology

## 2012-10-29 ENCOUNTER — Ambulatory Visit (HOSPITAL_COMMUNITY)
Admission: RE | Admit: 2012-10-29 | Discharge: 2012-10-29 | Disposition: A | Discharge status: Home / Self Care | Payer: Medicare Other | Source: Ambulatory Visit | Attending: Oncology | Admitting: Oncology

## 2012-10-29 DIAGNOSIS — C61 Malignant neoplasm of prostate: Secondary | ICD-10-CM | POA: Insufficient documentation

## 2012-10-29 DIAGNOSIS — J90 Pleural effusion, not elsewhere classified: Secondary | ICD-10-CM | POA: Insufficient documentation

## 2012-10-29 DIAGNOSIS — N200 Calculus of kidney: Secondary | ICD-10-CM | POA: Insufficient documentation

## 2012-10-29 DIAGNOSIS — M948X9 Other specified disorders of cartilage, unspecified sites: Secondary | ICD-10-CM | POA: Insufficient documentation

## 2012-10-29 DIAGNOSIS — C7952 Secondary malignant neoplasm of bone marrow: Secondary | ICD-10-CM | POA: Insufficient documentation

## 2012-10-29 DIAGNOSIS — R911 Solitary pulmonary nodule: Secondary | ICD-10-CM | POA: Insufficient documentation

## 2012-10-29 DIAGNOSIS — M439 Deforming dorsopathy, unspecified: Secondary | ICD-10-CM | POA: Insufficient documentation

## 2012-10-29 DIAGNOSIS — I700 Atherosclerosis of aorta: Secondary | ICD-10-CM | POA: Insufficient documentation

## 2012-10-29 DIAGNOSIS — N289 Disorder of kidney and ureter, unspecified: Secondary | ICD-10-CM | POA: Insufficient documentation

## 2012-10-29 DIAGNOSIS — K7689 Other specified diseases of liver: Secondary | ICD-10-CM | POA: Insufficient documentation

## 2012-10-29 DIAGNOSIS — J9819 Other pulmonary collapse: Secondary | ICD-10-CM | POA: Insufficient documentation

## 2012-10-29 DIAGNOSIS — C7951 Secondary malignant neoplasm of bone: Secondary | ICD-10-CM | POA: Insufficient documentation

## 2012-10-29 MED ORDER — TECHNETIUM TC 99M MEDRONATE IV KIT
25.0000 | PACK | Freq: Once | INTRAVENOUS | Status: AC | PRN
  Administered 2012-10-29: 25 via INTRAVENOUS

## 2012-10-29 MED ORDER — IOHEXOL 300 MG/ML  SOLN
100.0000 mL | Freq: Once | INTRAMUSCULAR | Status: AC | PRN
  Administered 2012-10-29: 100 mL via INTRAVENOUS

## 2012-11-03 ENCOUNTER — Telehealth: Payer: Self-pay | Admitting: Oncology

## 2012-11-03 ENCOUNTER — Ambulatory Visit (HOSPITAL_COMMUNITY)
Admission: RE | Admit: 2012-11-03 | Discharge: 2012-11-03 | Disposition: A | Discharge status: Home / Self Care | Payer: Medicare Other | Source: Ambulatory Visit | Attending: Oncology | Admitting: Oncology

## 2012-11-03 ENCOUNTER — Ambulatory Visit: Payer: Medicare Other | Admitting: Oncology

## 2012-11-03 ENCOUNTER — Telehealth: Payer: Self-pay | Admitting: *Deleted

## 2012-11-03 ENCOUNTER — Encounter: Payer: Self-pay | Admitting: Oncology

## 2012-11-03 ENCOUNTER — Ambulatory Visit (HOSPITAL_BASED_OUTPATIENT_CLINIC_OR_DEPARTMENT_OTHER): Payer: Medicare Other

## 2012-11-03 ENCOUNTER — Other Ambulatory Visit (HOSPITAL_BASED_OUTPATIENT_CLINIC_OR_DEPARTMENT_OTHER): Payer: Medicare Other | Admitting: Lab

## 2012-11-03 VITALS — BP 138/80 | HR 104 | Temp 98.3°F | Resp 18 | Ht 62.0 in | Wt 194.4 lb

## 2012-11-03 DIAGNOSIS — Z79899 Other long term (current) drug therapy: Secondary | ICD-10-CM | POA: Insufficient documentation

## 2012-11-03 DIAGNOSIS — M7989 Other specified soft tissue disorders: Secondary | ICD-10-CM | POA: Insufficient documentation

## 2012-11-03 DIAGNOSIS — C61 Malignant neoplasm of prostate: Secondary | ICD-10-CM

## 2012-11-03 DIAGNOSIS — R600 Localized edema: Secondary | ICD-10-CM

## 2012-11-03 DIAGNOSIS — E291 Testicular hypofunction: Secondary | ICD-10-CM

## 2012-11-03 DIAGNOSIS — Z5111 Encounter for antineoplastic chemotherapy: Secondary | ICD-10-CM

## 2012-11-03 DIAGNOSIS — G893 Neoplasm related pain (acute) (chronic): Secondary | ICD-10-CM

## 2012-11-03 DIAGNOSIS — C7951 Secondary malignant neoplasm of bone: Secondary | ICD-10-CM

## 2012-11-03 DIAGNOSIS — R609 Edema, unspecified: Secondary | ICD-10-CM | POA: Insufficient documentation

## 2012-11-03 LAB — CBC WITH DIFFERENTIAL/PLATELET
Basophils Absolute: 0.1 10*3/uL (ref 0.0–0.1)
EOS%: 1.6 % (ref 0.0–7.0)
HCT: 37.6 % — ABNORMAL LOW (ref 38.4–49.9)
HGB: 12 g/dL — ABNORMAL LOW (ref 13.0–17.1)
LYMPH%: 20.6 % (ref 14.0–49.0)
MCH: 30 pg (ref 27.2–33.4)
MCHC: 31.9 g/dL — ABNORMAL LOW (ref 32.0–36.0)
MCV: 94 fL (ref 79.3–98.0)
MONO%: 6.1 % (ref 0.0–14.0)
NEUT%: 71.2 % (ref 39.0–75.0)

## 2012-11-03 LAB — COMPREHENSIVE METABOLIC PANEL (CC13)
Albumin: 3.3 g/dL — ABNORMAL LOW (ref 3.5–5.0)
Alkaline Phosphatase: 116 U/L (ref 40–150)
BUN: 8.7 mg/dL (ref 7.0–26.0)
Creatinine: 0.6 mg/dL — ABNORMAL LOW (ref 0.7–1.3)
Glucose: 126 mg/dl (ref 70–140)
Total Bilirubin: 0.43 mg/dL (ref 0.20–1.20)

## 2012-11-03 MED ORDER — DEXAMETHASONE SODIUM PHOSPHATE 20 MG/5ML IJ SOLN
12.0000 mg | Freq: Once | INTRAMUSCULAR | Status: AC
  Administered 2012-11-03: 12 mg via INTRAVENOUS

## 2012-11-03 MED ORDER — DIPHENHYDRAMINE HCL 50 MG/ML IJ SOLN
25.0000 mg | Freq: Once | INTRAMUSCULAR | Status: AC
  Administered 2012-11-03: 25 mg via INTRAVENOUS

## 2012-11-03 MED ORDER — ONDANSETRON 8 MG/50ML IVPB (CHCC)
8.0000 mg | Freq: Once | INTRAVENOUS | Status: AC
  Administered 2012-11-03: 8 mg via INTRAVENOUS

## 2012-11-03 MED ORDER — FAMOTIDINE IN NACL 20-0.9 MG/50ML-% IV SOLN: 20.0000 mg | Freq: Once | INTRAVENOUS | Status: DC

## 2012-11-03 MED ORDER — OXYCODONE HCL 10 MG PO TABS: 10.0000 mg | ORAL_TABLET | ORAL | Status: DC | PRN

## 2012-11-03 MED ORDER — HEPARIN SOD (PORK) LOCK FLUSH 100 UNIT/ML IV SOLN
500.0000 [IU] | Freq: Once | INTRAVENOUS | Status: AC | PRN
  Administered 2012-11-03: 500 [IU]
  Filled 2012-11-03: qty 5

## 2012-11-03 MED ORDER — SODIUM CHLORIDE 0.9 % IV SOLN
Freq: Once | INTRAVENOUS | Status: AC
  Administered 2012-11-03: 11:00:00 via INTRAVENOUS

## 2012-11-03 MED ORDER — RANITIDINE HCL 50 MG/2ML IJ SOLN
50.0000 mg | Freq: Once | INTRAVENOUS | Status: AC
  Administered 2012-11-03: 50 mg via INTRAVENOUS
  Filled 2012-11-03: qty 2

## 2012-11-03 MED ORDER — SODIUM CHLORIDE 0.9 % IJ SOLN
10.0000 mL | INTRAMUSCULAR | Status: DC | PRN
  Administered 2012-11-03: 10 mL
  Filled 2012-11-03: qty 10

## 2012-11-03 MED ORDER — SODIUM CHLORIDE 0.9 % IV SOLN
25.0000 mg/m2 | Freq: Once | INTRAVENOUS | Status: AC
  Administered 2012-11-03: 50 mg via INTRAVENOUS
  Filled 2012-11-03: qty 5

## 2012-11-03 MED ORDER — OXYCODONE HCL 20 MG PO TB12: 20.0000 mg | ORAL_TABLET | Freq: Three times a day (TID) | ORAL | Status: DC

## 2012-11-03 NOTE — Patient Instructions (Addendum)
Cabazitaxel injection What is this medicine? CABAZITAXEL is a chemotherapy drug. It is used to treat prostate cancer. It targets fast dividing cells, like cancer cells, and causes these cells to die. This medicine may be used for other purposes; ask your health care provider or pharmacist if you have questions. What should I tell my health care provider before I take this medicine? They need to know if you have any of these conditions: -kidney disease -liver disease -low blood counts, like low white cell, platelet, or red cell counts -an unusual or allergic reaction to cabazitaxel, other medicines, foods, dyes, or preservatives -pregnant or trying to get pregnant -breast-feeding How should I use this medicine? This medicine is for infusion into a vein. It is given by a health care professional in a hospital or clinic setting. Talk to your pediatrician regarding the use of this medicine in children. Special care may be needed. Overdosage: If you think you've taken too much of this medicine contact a poison control center or emergency room at once. Overdosage: If you think you have taken too much of this medicine contact a poison control center or emergency room at once. NOTE: This medicine is only for you. Do not share this medicine with others. What if I miss a dose? It is important not to miss your dose. Call your doctor or health care professional if you are unable to keep an appointment. What may interact with this medicine? -carbamazepine -certain antiviral medicines for HIV or AIDS -clarithromycin -medicines for fungal infections like ketoconazole, fluconazole, itraconazole, and voriconazole -nefazodone -phenobarbital -phenytoin -rifabutin -rifampicin -rifampin -St. John's Wort -telithromycin This list may not describe all possible interactions. Give your health care provider a list of all the medicines, herbs, non-prescription drugs, or dietary supplements you use. Also tell  them if you smoke, drink alcohol, or use illegal drugs. Some items may interact with your medicine. What should I watch for while using this medicine? Your condition will be monitored carefully while you are receiving this medicine. This drug may make you feel generally unwell. This is not uncommon, as chemotherapy can affect healthy cells as well as cancer cells. Report any side effects. Continue your course of treatment even though you feel ill unless your doctor tells you to stop. Call your doctor or health care professional for advice if you get a fever, chills or sore throat, or other symptoms of a cold or flu. Do not treat yourself. This drug decreases your body's ability to fight infections. Try to avoid being around people who are sick. This medicine may increase your risk to bruise or bleed. Call your doctor or health care professional if you notice any unusual bleeding. Be careful brushing and flossing your teeth or using a toothpick because you may get an infection or bleed more easily. If you have any dental work done, tell your dentist you are receiving this medicine. Avoid taking products that contain aspirin, acetaminophen, ibuprofen, naproxen, or ketoprofen unless instructed by your doctor. These medicines may hide a fever. Do not become pregnant while taking this medicine. Women should inform their doctor if they wish to become pregnant or think they might be pregnant. There is a potential for serious side effects to an unborn child. Talk to your health care professional or pharmacist for more information. Do not breast-feed an infant while taking this medicine. What side effects may I notice from receiving this medicine? Side effects that you should report to your doctor or health care professional as soon  as possible: -allergic reactions like skin rash, itching or hives, swelling of the face, lips, or tongue -low blood counts - this medicine may decrease the number of white blood  cells, red blood cells and platelets. You may be at increased risk for infections and bleeding. -signs of infection - fever or chills, cough, sore throat, pain or difficulty passing urine -signs of decreased platelets or bleeding - bruising, pinpoint red spots on the skin, black, tarry stools, blood in the urine -signs of decreased red blood cells - unusually weak or tired, fainting spells, lightheadedness -breathing problems -pain, tingling, numbness in the hands or feet  Side effects that usually do not require medical attention (Report these to your doctor or health care professional if they continue or are bothersome.): -back pain -change in taste -constipation -hair loss -headache -loss of appetite -muscle or joint pain -nausea, vomiting -stomach pain This list may not describe all possible side effects. Call your doctor for medical advice about side effects. You may report side effects to FDA at 1-800-FDA-1088. Where should I keep my medicine? This drug is given in a hospital or clinic and will not be stored at home. NOTE: This sheet is a summary. It may not cover all possible information. If you have questions about this medicine, talk to your doctor, pharmacist, or health care provider.  2012, [employer]. (10/19/2008 4:54:06 PM)

## 2012-11-03 NOTE — Progress Notes (Signed)
VASCULAR LAB PRELIMINARY  PRELIMINARY  PRELIMINARY  PRELIMINARY  Right upper extremity venous duplex  completed.    Preliminary report:  No evidence of right upper extremity DVT or superficial thrombosis.  Kimra Kantor, RVS 11/03/2012, 1:42 PM

## 2012-11-03 NOTE — Progress Notes (Addendum)
Hematology and Oncology Follow Up Visit  Jeff Jimenez [id number] December 07, 1945 67 y.o. 11/03/2012 10:20 AM    Principle Diagnosis: 67 year old diagnosed with advanced prostate cancer in 2010 with disease in the pelvic adenopathy and bone disease.  Gleason score 5+4=9 in 5 out of the 8 cores, PSA 15.  Prior Therapy: He was started on Lupron and Casodex for his prostate cancer. His PSA nadir down to 0.02 back in March of 2012. In July of 2012, his PSA was up to 0.6. Casodex was stopped temporarily up until November of 2012. His PSA went up to 0.09. Casodex was restarted in November of 2012. In March of 2013, his PSA was 0.27 and most recently, his PSA was up to 0.76 with a castrate level of testosterone of 17.7. He received Zytiga 01/15/12 through 04/21/12 and this was stopped due to a rising PSA. Taxotere chemotherapy with every other day prednisone started on 04/28/12. He is S/P 8 cycles completed in 09/2012. PSA started to rise again after a close to 50% response in his PSA (from 49 to 27).  Current therapy: Started Jevtana on 10/13/2012. He is here for cycle 2 today. Lupron and monthly Xgeva given at [facility] urology.  Interim History: 67 year old presents today for a follow up visit. He received his first cycle of Jevtana 3 weeks ago. He reported fatigue lasting one week. States he is eating well. Denies nausea and vomiting. He is still very functional, is able to drive, perform most activities of daily living. His urine flow is reasonable with the help of Flomax, without it he has had some urine stream difficulties. He has not had any deterioration in his quality of life. No increase in his bone pain. Using Oxycontin TID and Oxycodone for breakthrough pain. No recent hospitalizations or illnesses. No neuropathy noted at this time. He reports slight increase bone pain after neulasta. Reports right arm swelling and tightness since Port-A-Cath placement. No pain or redness noted at [facility] site. It was used  without complications last treatment.    Medications: I have reviewed the patient's current medications. Current Outpatient Prescriptions  Medication Sig Dispense Refill  . amLODipine (NORVASC) 10 MG tablet Take 10 mg by mouth every evening.      Marland Kitchen atenolol (TENORMIN) 50 MG tablet Take 75 mg by mouth every evening.      . Calcium Carbonate-Vitamin D (CALCIUM 600+D) 600-400 MG-UNIT per tablet Take 2 tablets by mouth daily.       Marland Kitchen denosumab (XGEVA) 120 MG/1.7ML SOLN Inject 120 mg into the skin every 30 (thirty) days. Patient receives injection in drs office      . docusate sodium (COLACE) 100 MG capsule Take 300 mg by mouth daily.       Marland Kitchen leuprolide (LUPRON DEPOT) 22.5 MG injection Inject 22.5 mg into the muscle every 3 (three) months. Or as directed by urology  1 each  0  . lidocaine-prilocaine (EMLA) cream Apply topically as needed. Apply approximately 1/2 teaspoon to skin over port-a-cath. Do not rub in. Place clear plastic over cream. 1-1 and 1/2 hours prior to chemotherapy treatment.  30 g  2  . ondansetron (ZOFRAN) 8 MG tablet Take 8 mg by mouth every 8 (eight) hours as needed for nausea.      . ondansetron (ZOFRAN) 8 MG tablet TAKE 1 TABLET BY MOUTH EVERY 8 HOURS AS NEEDED FOR NAUSEA  20 tablet  0  . oxyCODONE (OXYCONTIN) 20 MG 12 hr tablet Take 1 tablet (20 mg  total) by mouth 3 (three) times daily. Take 1 tablet three times a day  90 tablet  0  . Oxycodone HCl 10 MG TABS Take 1 tablet (10 mg total) by mouth every 4 (four) hours as needed.  100 tablet  0  . predniSONE (DELTASONE) 5 MG tablet Take 5 mg by mouth every other day.       . Tamsulosin HCl (FLOMAX) 0.4 MG CAPS Take 0.4 mg by mouth daily.        No current facility-administered medications for this visit.    Allergies: No Known Allergies  Past Medical History, Surgical history, Social history, and Family History were reviewed and updated.  Review of Systems: Constitutional:  Negative for fever, chills, night sweats,  anorexia, weight loss, pain. Cardiovascular: no chest pain or dyspnea on exertion Respiratory: negative Neurological: negative Dermatological: negative ENT: negative Skin: Negative. Gastrointestinal: negative Genito-Urinary: negative Hematological and Lymphatic: negative Breast: negative Musculoskeletal: negative Remaining ROS negative.  Physical Exam: Blood pressure 138/80, pulse 104, temperature 98.3 F (36.8 C), temperature source Oral, resp. rate 18, height 5\' 2"  (1.575 m), weight 194 lb 6.4 oz (88.179 kg). ECOG: 1 General appearance: alert Head: Normocephalic, without obvious abnormality, atraumatic Neck: no adenopathy, no carotid bruit, no JVD, supple, symmetrical, trachea midline and thyroid not enlarged, symmetric, no tenderness/mass/nodules Lymph nodes: Cervical, supraclavicular, and axillary nodes normal. Heart:regular rate and rhythm, S1, S2 normal, no murmur, click, rub or gallop Lung:chest clear, no wheezing, rales, normal symmetric air entry Abdomen: soft, non-tender, without masses or organomegaly EXT:no erythema, induration, or nodules. Right upper extremity with 2+ edema.   Lab Results: Lab Results  Component Value Date   WBC 11.8* 11/03/2012   HGB 12.0* 11/03/2012   HCT 37.6* 11/03/2012   MCV 94.0 11/03/2012   PLT 208 11/03/2012     Chemistry      Component Value Date/Time   NA 140 10/13/2012 0801   NA 140 11/01/2011 1325   K 4.4 10/13/2012 0801   K 3.9 11/01/2011 1325   CL 109* 10/13/2012 0801   CL 104 11/01/2011 1325   CO2 22 10/13/2012 0801   CO2 28 11/01/2011 1325   BUN 12.2 10/13/2012 0801   BUN 9 11/01/2011 1325   CREATININE 0.6* 10/13/2012 0801   CREATININE 0.60 11/01/2011 1325      Component Value Date/Time   CALCIUM 8.8 10/13/2012 0801   CALCIUM 9.1 11/01/2011 1325   ALKPHOS 108 10/13/2012 0801   ALKPHOS 101 11/01/2011 1325   AST 36* 10/13/2012 0801   AST 34 11/01/2011 1325   ALT 25 10/13/2012 0801   ALT 37 11/01/2011 1325   BILITOT 0.40 10/13/2012 0801    BILITOT 0.4 11/01/2011 1325     Results for ARLES, RUMBOLD (MRN [id number]) as of 11/03/2012 08:39  Ref. Range 07/21/2012 08:01 08/10/2012 11:15 09/01/2012 07:41 09/22/2012 09:04 10/13/2012 08:01  PSA Latest Range: <=4.00 ng/mL 27.52 (H) 31.60 (H) 39.50 (H) 44.19 (H) 50.01 (H)   Imaging:  *RADIOLOGY REPORT*  Clinical Data: Restaging metastatic prostate cancer.  CT CHEST, ABDOMEN AND PELVIS WITH CONTRAST  Technique: Multidetector CT imaging of the chest, abdomen and  pelvis was performed following the standard protocol during bolus  administration of intravenous contrast.  Contrast: OMNIPAQUE IOHEXOL 300 MG/ML SOLN  Comparison: CT 11/06/2011. Bone scan today and 11/06/2011.  CT CHEST  Findings: Right IJ Port-A-Cath has been placed with its tip at the  SVC right atrial junction. Minimal right thyroid nodularity is  stable. Small mediastinal  lymph nodes are stable. There are no  enlarged mediastinal, hilar or axillary lymph nodes.  There is stable mild dilatation of the ascending aorta measuring up  to 4.5 cm transverse. There is minimal associated atherosclerosis.  Small bilateral pleural effusions have developed. There is no  pericardial effusion.  Increased dependent atelectasis is present in both lung bases. A 3  mm right upper lobe nodule on image 18 is stable. No enlarging  pulmonary nodules are identified.  Widespread osseous metastatic disease is again demonstrated with  interval progressive sclerosis. Thoracic spine compression  deformities are grossly stable, most prominent at T3 and T4. No  significant epidural tumor is seen.  IMPRESSION:  1. Progressive sclerosis of diffuse osseous metastatic disease.  Thoracic spine compression deformities are grossly stable.  2. No evidence of extra osseous thoracic metastatic disease.  3. New small bilateral pleural effusions with associated increased  atelectasis at both lung bases.  CT ABDOMEN AND PELVIS  Findings: Hepatic  steatosis has improved. There are no focal  hepatic lesions. The gallbladder, biliary system, pancreas and  spleen appear normal. There is no adrenal mass. Small renal  calculi and low density renal lesions bilaterally are unchanged.  There is no hydronephrosis.  Sigmoid colon diverticular changes are stable. There is no  surrounding inflammation. The stomach, small bowel and appendix  appear normal. There are no residual enlarged abdominal pelvic  lymph nodes. A portacaval node measures 9 mm short axis on image 58  (previously 10 mm). The previously noted enlarged left internal  iliac node has significantly decreased in size, measuring 8 mm  short axis on image 99 (previously 13 mm). There is stable mild  aortic atherosclerosis.  The bladder appears stable. Protuberance of prostatic tissue into  the bladder lumen is slightly less evident. The overall size of  the prostate gland has decreased.  Widespread osseous metastatic disease is again noted. Compared  with the prior CT, the sclerosis has progressed in extent, although  the associated compression fractures at L2 and L4 have not  significantly changed. No significant epidural tumor is evident.  IMPRESSION:  1. Progressive sclerosis of the widespread osseous metastatic  disease. The associated lumbar compression deformities are grossly  unchanged.  2. Resolution of pelvic adenopathy with decreased size of the  prostate gland and less protuberance of prostatic tissue in the  bladder lumen. No hydronephrosis.  3. No other evidence of extra osseous metastatic disease.  Original Report Authenticated By: Carey Bullocks, M.D.  *RADIOLOGY REPORT*  Clinical Data: Metastatic prostate cancer. Serum PSA 50.1.  NUCLEAR MEDICINE WHOLE BODY BONE SCINTIGRAPHY  Technique: Whole body anterior and posterior images were obtained  approximately 3 hours after intravenous injection of  radiopharmaceutical.  Radiopharmaceutical: CURIE TC-MDP  TECHNETIUM TC 15M  MEDRONATE IV KIT  Comparison: Whole body bone scan 11/06/2011. CTs of the chest,  abdomen and pelvis performed today.  Findings: The prior examination demonstrates diffusely increased  activity throughout the axial and proximal appendicular skeleton.  Notably, there is patchy activity within both humeral diaphyses and  the left femoral diaphysis. There is also asymmetric activity in  the region of the right femoral lesser trochanter. The calvarial  activity has also mildly increased.  No significant renal activity is demonstrated, although there is a  small amount of activity within the urinary bladder.  IMPRESSION:  Super scan pattern consistent with widespread osseous metastatic  disease to the axial and proximal appendicular. This disease  demonstrates progressive sclerosis on today's CT.  Original Report Authenticated By: Carey Bullocks, M.D.  Impression and Plan: This is a pleasant 67 year old gentleman with the following issues:   1. Prostate cancer. His diagnosis dates back to October of 2010. He had a prostate-specific antigen of 15, Gleason score of 5+ 4=9. He has developed castration-resistant disease. He received Taxotere without major complications but his PSA is going up slowly from 27 to 44 in 3 months. He had a restaging CT scan and bone scan which was discussed today. Scan showed widespread osseous metastatic disease which was progressive from prior scans. However, pelvic adenopathy was improved. Recommend that he proceed with cycle 2 of Jevtana today without dose modification.   2. Bony disease. Continue Xgeva at [facility].   3. Androgen deprivation.  I will recommend continuing that for the time being. Last given in April 2014.  4. Pain. His bony pain is under reasonable control with OxyContin and oxycodone. I have refilled his OxyContin and oxycodone today.  5. IV access: PAC is place.  6. Neutropenia prophylaxis: On Neulasta with each cycle  of chemo.  7. Nausea/vomiting. Continue Zofran. Much improved now.  8. Weight gain: Better since changing Prednisone to QOD.   9. right upper extremity edema: Likely dependent edema, but we will go ahead and obtain a Doppler ultrasound to rule out a blood clot.  10. Follow-up: 3 weeks for cycle 3 of Jevtana.    Clenton Pare 7/15/201410:20 AM     Patient seen today and examined today. He feels well and have tolerated chemotherapy well.   Exam today revealed overall face, neck and arm swelling. Right arm > L arm swelling noted. No erythema or pain.  No other exam abnormalities noted.   Ct scan and bone scan results reviewed with the patient personally today.   The plan is to obtain right UE dopplers to rule out DVT and continue with chemotherapy at this time.  If he progresses on Jevtana, Standley Dakins will be the next step.    [facility] MD 11/03/2012

## 2012-11-03 NOTE — Telephone Encounter (Signed)
Patient s/p venous doppler study of right arm.  Verbal call report received from [place] that results are negative for DVT.  Clenton Pare notified.  Patient may be discharged home.  If any further instructions patient will be called at home.

## 2012-11-03 NOTE — Telephone Encounter (Signed)
gv and printed appt sched

## 2012-11-03 NOTE — Patient Instructions (Signed)
Results for Jeff Jimenez, Jeff Jimenez (MRN [id number]) as of 11/03/2012 08:39  Ref. Range 07/21/2012 08:01 08/10/2012 11:15 09/01/2012 07:41 09/22/2012 09:04 10/13/2012 08:01  PSA Latest Range: <=4.00 ng/mL 27.52 (H) 31.60 (H) 39.50 (H) 44.19 (H) 50.01 (H)

## 2012-11-04 ENCOUNTER — Ambulatory Visit (HOSPITAL_BASED_OUTPATIENT_CLINIC_OR_DEPARTMENT_OTHER): Payer: Medicare Other

## 2012-11-04 ENCOUNTER — Telehealth: Payer: Self-pay | Admitting: *Deleted

## 2012-11-04 VITALS — BP 125/63 | HR 81 | Temp 98.5°F

## 2012-11-04 DIAGNOSIS — C7951 Secondary malignant neoplasm of bone: Secondary | ICD-10-CM

## 2012-11-04 DIAGNOSIS — Z5189 Encounter for other specified aftercare: Secondary | ICD-10-CM

## 2012-11-04 DIAGNOSIS — C61 Malignant neoplasm of prostate: Secondary | ICD-10-CM

## 2012-11-04 MED ORDER — PEGFILGRASTIM INJECTION 6 MG/0.6ML
6.0000 mg | Freq: Once | SUBCUTANEOUS | Status: AC
  Administered 2012-11-04: 6 mg via SUBCUTANEOUS
  Filled 2012-11-04: qty 0.6

## 2012-11-04 NOTE — Telephone Encounter (Signed)
Message copied by Reesa Chew on Wed Nov 04, 2012  9:01 AM ------      Message from: Benjiman Core      Created: Wed Nov 04, 2012  8:24 AM       Please call his PSA. Down to 40 from 50. ------

## 2012-11-04 NOTE — Telephone Encounter (Signed)
Called patient with results of latest PSA.

## 2012-11-18 ENCOUNTER — Other Ambulatory Visit: Payer: Self-pay | Admitting: *Deleted

## 2012-11-18 DIAGNOSIS — C61 Malignant neoplasm of prostate: Secondary | ICD-10-CM

## 2012-11-18 MED ORDER — PREDNISONE 5 MG PO TABS: 5.0000 mg | ORAL_TABLET | Freq: Every day | ORAL | Status: DC

## 2012-11-18 MED ORDER — AMLODIPINE BESYLATE 10 MG PO TABS: 10.0000 mg | ORAL_TABLET | Freq: Every evening | ORAL | Status: DC

## 2012-11-18 MED ORDER — ATENOLOL 50 MG PO TABS: 75.0000 mg | ORAL_TABLET | Freq: Every evening | ORAL | Status: DC

## 2012-11-19 ENCOUNTER — Other Ambulatory Visit: Payer: Self-pay | Admitting: Nurse Practitioner

## 2012-11-19 DIAGNOSIS — C61 Malignant neoplasm of prostate: Secondary | ICD-10-CM

## 2012-11-19 MED ORDER — PREDNISONE 5 MG PO TABS: 5.0000 mg | ORAL_TABLET | ORAL | Status: DC

## 2012-11-24 ENCOUNTER — Telehealth: Payer: Self-pay | Admitting: Oncology

## 2012-11-24 ENCOUNTER — Ambulatory Visit (HOSPITAL_BASED_OUTPATIENT_CLINIC_OR_DEPARTMENT_OTHER): Payer: Medicare Other | Admitting: Oncology

## 2012-11-24 ENCOUNTER — Ambulatory Visit (HOSPITAL_BASED_OUTPATIENT_CLINIC_OR_DEPARTMENT_OTHER): Payer: Medicare Other

## 2012-11-24 ENCOUNTER — Other Ambulatory Visit: Payer: Self-pay | Admitting: Oncology

## 2012-11-24 ENCOUNTER — Other Ambulatory Visit (HOSPITAL_BASED_OUTPATIENT_CLINIC_OR_DEPARTMENT_OTHER): Payer: Medicare Other | Admitting: Lab

## 2012-11-24 VITALS — BP 120/72 | HR 82 | Temp 98.3°F | Resp 20 | Ht 62.0 in | Wt 187.7 lb

## 2012-11-24 DIAGNOSIS — C7952 Secondary malignant neoplasm of bone marrow: Secondary | ICD-10-CM

## 2012-11-24 DIAGNOSIS — C61 Malignant neoplasm of prostate: Secondary | ICD-10-CM

## 2012-11-24 DIAGNOSIS — C7951 Secondary malignant neoplasm of bone: Secondary | ICD-10-CM

## 2012-11-24 DIAGNOSIS — E291 Testicular hypofunction: Secondary | ICD-10-CM

## 2012-11-24 DIAGNOSIS — Z5111 Encounter for antineoplastic chemotherapy: Secondary | ICD-10-CM

## 2012-11-24 DIAGNOSIS — G893 Neoplasm related pain (acute) (chronic): Secondary | ICD-10-CM

## 2012-11-24 LAB — CBC WITH DIFFERENTIAL/PLATELET
Eosinophils Absolute: 0.2 10*3/uL (ref 0.0–0.5)
HCT: 37 % — ABNORMAL LOW (ref 38.4–49.9)
HGB: 11.9 g/dL — ABNORMAL LOW (ref 13.0–17.1)
LYMPH%: 22.1 % (ref 14.0–49.0)
MONO#: 1.1 10*3/uL — ABNORMAL HIGH (ref 0.1–0.9)
NEUT#: 8.2 10*3/uL — ABNORMAL HIGH (ref 1.5–6.5)
Platelets: 185 10*3/uL (ref 140–400)
RBC: 3.96 10*6/uL — ABNORMAL LOW (ref 4.20–5.82)
WBC: 12.3 10*3/uL — ABNORMAL HIGH (ref 4.0–10.3)

## 2012-11-24 LAB — COMPREHENSIVE METABOLIC PANEL (CC13)
Albumin: 3.3 g/dL — ABNORMAL LOW (ref 3.5–5.0)
CO2: 20 mEq/L — ABNORMAL LOW (ref 22–29)
Glucose: 114 mg/dl (ref 70–140)
Sodium: 138 mEq/L (ref 136–145)
Total Bilirubin: 0.46 mg/dL (ref 0.20–1.20)
Total Protein: 6.1 g/dL — ABNORMAL LOW (ref 6.4–8.3)

## 2012-11-24 LAB — PSA: PSA: 30.54 ng/mL — ABNORMAL HIGH (ref <=4.00)

## 2012-11-24 MED ORDER — HEPARIN SOD (PORK) LOCK FLUSH 100 UNIT/ML IV SOLN
500.0000 [IU] | Freq: Once | INTRAVENOUS | Status: AC | PRN
  Administered 2012-11-24: 500 [IU]
  Filled 2012-11-24: qty 5

## 2012-11-24 MED ORDER — DIPHENHYDRAMINE HCL 50 MG/ML IJ SOLN
25.0000 mg | Freq: Once | INTRAMUSCULAR | Status: AC
  Administered 2012-11-24: 25 mg via INTRAVENOUS

## 2012-11-24 MED ORDER — DEXAMETHASONE SODIUM PHOSPHATE 20 MG/5ML IJ SOLN
12.0000 mg | Freq: Once | INTRAMUSCULAR | Status: AC
  Administered 2012-11-24: 12 mg via INTRAVENOUS

## 2012-11-24 MED ORDER — ONDANSETRON 8 MG/50ML IVPB (CHCC)
8.0000 mg | Freq: Once | INTRAVENOUS | Status: AC
  Administered 2012-11-24: 8 mg via INTRAVENOUS

## 2012-11-24 MED ORDER — FAMOTIDINE IN NACL 20-0.9 MG/50ML-% IV SOLN
20.0000 mg | Freq: Once | INTRAVENOUS | Status: AC
  Administered 2012-11-24: 20 mg via INTRAVENOUS

## 2012-11-24 MED ORDER — OXYCODONE HCL 10 MG PO TABS: 10.0000 mg | ORAL_TABLET | ORAL | Status: DC | PRN

## 2012-11-24 MED ORDER — DEXTROSE 5 % IV SOLN
25.0000 mg/m2 | Freq: Once | INTRAVENOUS | Status: AC
  Administered 2012-11-24: 50 mg via INTRAVENOUS
  Filled 2012-11-24: qty 5

## 2012-11-24 MED ORDER — SODIUM CHLORIDE 0.9 % IV SOLN
Freq: Once | INTRAVENOUS | Status: AC
  Administered 2012-11-24: 09:00:00 via INTRAVENOUS

## 2012-11-24 MED ORDER — SODIUM CHLORIDE 0.9 % IJ SOLN
10.0000 mL | INTRAMUSCULAR | Status: DC | PRN
Start: 2012-11-24 — End: 2012-11-24
  Administered 2012-11-24: 10 mL
  Filled 2012-11-24: qty 10

## 2012-11-24 NOTE — Telephone Encounter (Signed)
gv and printed aptp sched to pt...emailed MB to add tx.

## 2012-11-24 NOTE — Progress Notes (Signed)
Hematology and Oncology Follow Up Visit  Jeff Jimenez [id number] [birth year] 67 y.o. 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  Gleason score 5+4=9 in 5 out of the 8 cores, PSA 15.  Prior Therapy: He was started on Lupron and Casodex for his prostate cancer. His PSA nadir down to 0.02 back in March of 2012. In July of 2012, his PSA was up to 0.6. Casodex was stopped temporarily up until November of 2012. His PSA went up to 0.09. Casodex was restarted in November of 2012. In March of 2013, his PSA was 0.27 and most recently, his PSA was up to 0.76 with a castrate level of testosterone of 17.7. He received Zytiga 01/15/12 through 04/21/12 and this was stopped due to a rising PSA. Taxotere chemotherapy with every other day prednisone started on 04/28/12. He is S/P 8 cycles completed in 09/2012. PSA started to rise again after a close to 50% response in his PSA (from 49 to 27).  Current therapy: Started Jevtana on 10/13/2012. He is here for cycle 3 today. Lupron and monthly Xgeva given at [facility] urology.  Interim History: 67 year old presents today for a follow up visit. He received his first 2 cycle of Jevtana without complications. He reported fatigue lasting one week. States he is eating well. Denies nausea and vomiting. He is still very functional, is able to drive, perform most activities of daily living. His urine flow is reasonable with the help of Flomax, without it he has had some urine stream difficulties. He has not had any deterioration in his quality of life. No increase in his bone pain. Using Oxycontin TID and Oxycodone for breakthrough pain. No recent hospitalizations or illnesses. No neuropathy noted at this time. He reports slight increase bone pain after neulasta. Slight rash on his leg and chest that is improving. Overall, clinically stable.    Medications: I have  reviewed the patient's current medications. Current Outpatient Prescriptions  Medication Sig Dispense Refill  . amLODipine (NORVASC) 10 MG tablet Take 1 tablet (10 mg total) by mouth every evening.  30 tablet  9  . atenolol (TENORMIN) 50 MG tablet Take 1.5 tablets (75 mg total) by mouth every evening.  30 tablet  9  . Calcium Carbonate-Vitamin D (CALCIUM 600+D) 600-400 MG-UNIT per tablet Take 2 tablets by mouth daily.       Marland Kitchen denosumab (XGEVA) 120 MG/1.7ML SOLN Inject 120 mg into the skin every 30 (thirty) days. Patient receives injection in drs office      . docusate sodium (COLACE) 100 MG capsule Take 300 mg by mouth daily.       Marland Kitchen leuprolide (LUPRON DEPOT) 22.5 MG injection Inject 22.5 mg into the muscle every 3 (three) months. Or as directed by urology  1 each  0  . lidocaine-prilocaine (EMLA) cream Apply topically as needed. Apply approximately 1/2 teaspoon to skin over port-a-cath. Do not rub in. Place clear plastic over cream. 1-1 and 1/2 hours prior to chemotherapy treatment.  30 g  2  . ondansetron (ZOFRAN) 8 MG tablet Take 8 mg by mouth every 8 (eight) hours as needed for nausea.      . ondansetron (ZOFRAN) 8 MG tablet TAKE 1 TABLET BY MOUTH EVERY 8 HOURS AS NEEDED FOR NAUSEA  20 tablet  0  . oxyCODONE (OXYCONTIN) 20 MG 12 hr tablet Take 1 tablet (20 mg total) by mouth  3 (three) times daily. Take 1 tablet three times a day  90 tablet  0  . Oxycodone HCl 10 MG TABS Take 1 tablet (10 mg total) by mouth every 4 (four) hours as needed.  100 tablet  0  . predniSONE (DELTASONE) 5 MG tablet Take 1 tablet (5 mg total) by mouth every other day. 90 day supply  45 tablet  1  . Tamsulosin HCl (FLOMAX) 0.4 MG CAPS Take 0.4 mg by mouth daily.        No current facility-administered medications for this visit.    Allergies: No Known Allergies  Past Medical History, Surgical history, Social history, and Family History were reviewed and updated.  Review of Systems: Constitutional:  Negative for  fever, chills, night sweats, anorexia, weight loss, pain. Cardiovascular: no chest pain or dyspnea on exertion Respiratory: negative Neurological: negative Dermatological: negative ENT: negative Skin: Negative. Gastrointestinal: negative Genito-Urinary: negative Hematological and Lymphatic: negative Breast: negative Musculoskeletal: negative Remaining ROS negative.  Physical Exam: Blood pressure 120/72, pulse 82, temperature 98.3 F (36.8 C), temperature source Oral, resp. rate 20, height 5\' 2"  (1.575 m), weight 187 lb 11.2 oz (85.14 kg). ECOG: 1 General appearance: alert Head: Normocephalic, without obvious abnormality, atraumatic Neck: no adenopathy, no carotid bruit, no JVD, supple, symmetrical, trachea midline and thyroid not enlarged, symmetric, no tenderness/mass/nodules Lymph nodes: Cervical, supraclavicular, and axillary nodes normal. Heart:regular rate and rhythm, S1, S2 normal, no murmur, click, rub or gallop Lung:chest clear, no wheezing, rales, normal symmetric air entry Abdomen: soft, non-tender, without masses or organomegaly EXT:no erythema, induration, or nodules. Right upper extremity with 2+ edema.   Lab Results: Lab Results  Component Value Date   WBC 12.3* 11/24/2012   HGB 11.9* 11/24/2012   HCT 37.0* 11/24/2012   MCV 93.4 11/24/2012   PLT 185 11/24/2012     Chemistry      Component Value Date/Time   NA 137 11/03/2012 0842   NA 140 11/01/2011 1325   K 4.7 11/03/2012 0842   K 3.9 11/01/2011 1325   CL 109* 10/13/2012 0801   CL 104 11/01/2011 1325   CO2 20* 11/03/2012 0842   CO2 28 11/01/2011 1325   BUN 8.7 11/03/2012 0842   BUN 9 11/01/2011 1325   CREATININE 0.6* 11/03/2012 0842   CREATININE 0.60 11/01/2011 1325      Component Value Date/Time   CALCIUM 8.4 11/03/2012 0842   CALCIUM 9.1 11/01/2011 1325   ALKPHOS 116 11/03/2012 0842   ALKPHOS 101 11/01/2011 1325   AST 36* 11/03/2012 0842   AST 34 11/01/2011 1325   ALT 27 11/03/2012 0842   ALT 37 11/01/2011 1325    BILITOT 0.43 11/03/2012 0842   BILITOT 0.4 11/01/2011 1325      Results for XXAVIER, NOON (MRN [id number]) as of 11/24/2012 08:58  Ref. Range 10/13/2012 08:01 11/03/2012 08:43  PSA Latest Range: <=4.00 ng/mL 50.01 (H) 40.12 (H)    Impression and Plan:  This is a pleasant 67 year old gentleman with the following issues:   1. Prostate cancer. His diagnosis dates back to October of 2010. He had a prostate-specific antigen of 15, Gleason score of 5+ 4=9. He has developed castration-resistant disease. He received Taxotere without major complications but his PSA is going up slowly from 27 to 44 in 3 months. He had a restaging CT scan and bone scan  showed widespread osseous metastatic disease which was progressive from prior scans. However, pelvic adenopathy was improved. Recommend that he proceed with cycle 3 of  Jevtana today without dose modification.   2. Bony disease. Continue Xgeva at [facility].   3. Androgen deprivation.  I will recommend continuing that for the time being. Last given in April 2014.  4. Pain. His bony pain is under reasonable control with OxyContin and oxycodone. I have refilled his OxyContin and oxycodone today.  5. IV access: PAC is place.  6. Neutropenia prophylaxis: On Neulasta with each cycle of chemo.  7. Nausea/vomiting. Continue Zofran. Much improved now.  8. Weight gain: Better since changing Prednisone to QOD.   9. right upper extremity edema: Likely dependent edema improved now.   10. Follow-up: 3 weeks for cycle 4 of Jevtana.     [facility] MD 11/24/2012

## 2012-11-24 NOTE — Patient Instructions (Addendum)
[facility] Discharge Instructions for Patients Receiving Chemotherapy  Today you received the following chemotherapy agents Jevtana To help prevent nausea and vomiting after your treatment, we encourage you to take your nausea medication as prescribed.  If you develop nausea and vomiting that is not controlled by your nausea medication, call the clinic.   BELOW ARE SYMPTOMS THAT SHOULD BE REPORTED IMMEDIATELY:  *FEVER GREATER THAN 100.5 F  *CHILLS WITH OR WITHOUT FEVER  NAUSEA AND VOMITING THAT IS NOT CONTROLLED WITH YOUR NAUSEA MEDICATION  *UNUSUAL SHORTNESS OF BREATH  *UNUSUAL BRUISING OR BLEEDING  TENDERNESS IN MOUTH AND THROAT WITH OR WITHOUT PRESENCE OF ULCERS  *URINARY PROBLEMS  *BOWEL PROBLEMS  UNUSUAL RASH Items with * indicate a potential emergency and should be followed up as soon as possible.  Feel free to call the clinic you have any questions or concerns. The clinic phone number is [phone].    

## 2012-11-25 ENCOUNTER — Telehealth: Payer: Self-pay | Admitting: *Deleted

## 2012-11-25 ENCOUNTER — Ambulatory Visit (HOSPITAL_BASED_OUTPATIENT_CLINIC_OR_DEPARTMENT_OTHER): Payer: Medicare Other

## 2012-11-25 VITALS — BP 112/65 | HR 97 | Temp 98.8°F

## 2012-11-25 DIAGNOSIS — C7951 Secondary malignant neoplasm of bone: Secondary | ICD-10-CM

## 2012-11-25 DIAGNOSIS — C61 Malignant neoplasm of prostate: Secondary | ICD-10-CM

## 2012-11-25 MED ORDER — PEGFILGRASTIM INJECTION 6 MG/0.6ML
6.0000 mg | Freq: Once | SUBCUTANEOUS | Status: AC
  Administered 2012-11-25: 6 mg via SUBCUTANEOUS
  Filled 2012-11-25: qty 0.6

## 2012-11-25 NOTE — Telephone Encounter (Signed)
Per dr Clelia Croft, called patient with results of PSA done yesterday.

## 2012-11-25 NOTE — Telephone Encounter (Signed)
Message copied by Reesa Chew on Wed Nov 25, 2012  8:40 AM ------      Message from: Benjiman Core      Created: Wed Nov 25, 2012  8:25 AM       Please call his PSA ------

## 2012-11-30 ENCOUNTER — Other Ambulatory Visit: Payer: Self-pay | Admitting: Oncology

## 2012-12-03 ENCOUNTER — Other Ambulatory Visit: Payer: Self-pay | Admitting: *Deleted

## 2012-12-03 DIAGNOSIS — C61 Malignant neoplasm of prostate: Secondary | ICD-10-CM

## 2012-12-03 MED ORDER — LIDOCAINE-PRILOCAINE 2.5-2.5 % EX CREA: TOPICAL_CREAM | CUTANEOUS | Status: DC | PRN

## 2012-12-14 ENCOUNTER — Other Ambulatory Visit: Payer: Self-pay | Admitting: *Deleted

## 2012-12-14 DIAGNOSIS — R739 Hyperglycemia, unspecified: Secondary | ICD-10-CM

## 2012-12-14 DIAGNOSIS — D649 Anemia, unspecified: Secondary | ICD-10-CM

## 2012-12-15 ENCOUNTER — Other Ambulatory Visit: Payer: Medicare Other | Admitting: Lab

## 2012-12-15 ENCOUNTER — Encounter: Payer: Self-pay | Admitting: Oncology

## 2012-12-15 ENCOUNTER — Ambulatory Visit (HOSPITAL_BASED_OUTPATIENT_CLINIC_OR_DEPARTMENT_OTHER): Payer: Medicare Other | Admitting: Oncology

## 2012-12-15 ENCOUNTER — Ambulatory Visit (HOSPITAL_BASED_OUTPATIENT_CLINIC_OR_DEPARTMENT_OTHER): Payer: Medicare Other

## 2012-12-15 VITALS — BP 124/72 | HR 88 | Temp 97.7°F | Resp 20 | Ht 62.0 in | Wt 192.0 lb

## 2012-12-15 DIAGNOSIS — C61 Malignant neoplasm of prostate: Secondary | ICD-10-CM

## 2012-12-15 DIAGNOSIS — C7951 Secondary malignant neoplasm of bone: Secondary | ICD-10-CM

## 2012-12-15 DIAGNOSIS — E291 Testicular hypofunction: Secondary | ICD-10-CM

## 2012-12-15 DIAGNOSIS — Z5111 Encounter for antineoplastic chemotherapy: Secondary | ICD-10-CM

## 2012-12-15 DIAGNOSIS — R609 Edema, unspecified: Secondary | ICD-10-CM

## 2012-12-15 DIAGNOSIS — M899 Disorder of bone, unspecified: Secondary | ICD-10-CM

## 2012-12-15 LAB — COMPREHENSIVE METABOLIC PANEL (CC13)
AST: 40 U/L — ABNORMAL HIGH (ref 5–34)
Albumin: 3.1 g/dL — ABNORMAL LOW (ref 3.5–5.0)
Alkaline Phosphatase: 193 U/L — ABNORMAL HIGH (ref 40–150)
BUN: 10.3 mg/dL (ref 7.0–26.0)
Calcium: 7.7 mg/dL — ABNORMAL LOW (ref 8.4–10.4)
Chloride: 109 mEq/L (ref 98–109)
Glucose: 119 mg/dl (ref 70–140)
Potassium: 4.4 mEq/L (ref 3.5–5.1)
Sodium: 139 mEq/L (ref 136–145)
Total Protein: 6 g/dL — ABNORMAL LOW (ref 6.4–8.3)

## 2012-12-15 LAB — CBC WITH DIFFERENTIAL/PLATELET
BASO%: 0.5 % (ref 0.0–2.0)
Basophils Absolute: 0.1 10*3/uL (ref 0.0–0.1)
EOS%: 1.2 % (ref 0.0–7.0)
HCT: 37.2 % — ABNORMAL LOW (ref 38.4–49.9)
HGB: 11.8 g/dL — ABNORMAL LOW (ref 13.0–17.1)
MONO#: 1.2 10*3/uL — ABNORMAL HIGH (ref 0.1–0.9)
NEUT#: 9.2 10*3/uL — ABNORMAL HIGH (ref 1.5–6.5)
NEUT%: 68 % (ref 39.0–75.0)
RDW: 17.8 % — ABNORMAL HIGH (ref 11.0–14.6)
WBC: 13.6 10*3/uL — ABNORMAL HIGH (ref 4.0–10.3)
lymph#: 2.9 10*3/uL (ref 0.9–3.3)

## 2012-12-15 MED ORDER — OXYCODONE HCL 20 MG PO TB12: 20.0000 mg | ORAL_TABLET | Freq: Three times a day (TID) | ORAL | Status: DC

## 2012-12-15 MED ORDER — SODIUM CHLORIDE 0.9 % IV SOLN
Freq: Once | INTRAVENOUS | Status: AC
  Administered 2012-12-15: 10:00:00 via INTRAVENOUS

## 2012-12-15 MED ORDER — HEPARIN SOD (PORK) LOCK FLUSH 100 UNIT/ML IV SOLN
500.0000 [IU] | Freq: Once | INTRAVENOUS | Status: AC | PRN
  Administered 2012-12-15: 500 [IU]
  Filled 2012-12-15: qty 5

## 2012-12-15 MED ORDER — DEXTROSE 5 % IV SOLN
25.0000 mg/m2 | Freq: Once | INTRAVENOUS | Status: AC
  Administered 2012-12-15: 50 mg via INTRAVENOUS
  Filled 2012-12-15: qty 5

## 2012-12-15 MED ORDER — OXYCODONE HCL 10 MG PO TABS: 10.0000 mg | ORAL_TABLET | ORAL | Status: DC | PRN

## 2012-12-15 MED ORDER — SODIUM CHLORIDE 0.9 % IJ SOLN
10.0000 mL | INTRAMUSCULAR | Status: DC | PRN
  Administered 2012-12-15: 10 mL
  Filled 2012-12-15: qty 10

## 2012-12-15 MED ORDER — DIPHENHYDRAMINE HCL 50 MG/ML IJ SOLN
25.0000 mg | Freq: Once | INTRAMUSCULAR | Status: AC
  Administered 2012-12-15: 25 mg via INTRAVENOUS

## 2012-12-15 MED ORDER — DEXAMETHASONE SODIUM PHOSPHATE 20 MG/5ML IJ SOLN
12.0000 mg | Freq: Once | INTRAMUSCULAR | Status: AC
  Administered 2012-12-15: 12 mg via INTRAVENOUS

## 2012-12-15 MED ORDER — FAMOTIDINE IN NACL 20-0.9 MG/50ML-% IV SOLN
20.0000 mg | Freq: Once | INTRAVENOUS | Status: AC
  Administered 2012-12-15: 20 mg via INTRAVENOUS

## 2012-12-15 MED ORDER — ONDANSETRON 8 MG/50ML IVPB (CHCC)
8.0000 mg | Freq: Once | INTRAVENOUS | Status: AC
  Administered 2012-12-15: 8 mg via INTRAVENOUS

## 2012-12-15 MED ORDER — OXYCODONE HCL ER 10 MG PO T12A: 10.0000 mg | EXTENDED_RELEASE_TABLET | Freq: Three times a day (TID) | ORAL | Status: DC

## 2012-12-15 NOTE — Patient Instructions (Signed)
Results for KELE, BARTHELEMY (MRN [id number]) as of 12/15/2012 08:41  Ref. Range 09/01/2012 07:41 09/22/2012 09:04 10/13/2012 08:01 11/03/2012 08:43 11/24/2012 08:32  PSA Latest Range: <=4.00 ng/mL 39.50 (H) 44.19 (H) 50.01 (H) 40.12 (H) 30.54 (H)

## 2012-12-15 NOTE — Patient Instructions (Addendum)
[facility] Discharge Instructions for Patients Receiving Chemotherapy  Today you received the following chemotherapy agents JEVTANA  To help prevent nausea and vomiting after your treatment, we encourage you to take your nausea medication AS NEEDED.   If you develop nausea and vomiting that is not controlled by your nausea medication, call the clinic.   BELOW ARE SYMPTOMS THAT SHOULD BE REPORTED IMMEDIATELY:  *FEVER GREATER THAN 100.5 F  *CHILLS WITH OR WITHOUT FEVER  NAUSEA AND VOMITING THAT IS NOT CONTROLLED WITH YOUR NAUSEA MEDICATION  *UNUSUAL SHORTNESS OF BREATH  *UNUSUAL BRUISING OR BLEEDING  TENDERNESS IN MOUTH AND THROAT WITH OR WITHOUT PRESENCE OF ULCERS  *URINARY PROBLEMS  *BOWEL PROBLEMS  UNUSUAL RASH Items with * indicate a potential emergency and should be followed up as soon as possible.  Feel free to call the clinic you have any questions or concerns. The clinic phone number is [phone].

## 2012-12-15 NOTE — Progress Notes (Signed)
Hematology and Oncology Follow Up Visit  Jeff Jimenez [id number] 12-12-1945 67 y.o. 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  Gleason score 5+4=9 in 5 out of the 8 cores, PSA 15.  Prior Therapy: He was started on Lupron and Casodex for his prostate cancer. His PSA nadir down to 0.02 back in March of 2012. In July of 2012, his PSA was up to 0.6. Casodex was stopped temporarily up until November of 2012. His PSA went up to 0.09. Casodex was restarted in November of 2012. In March of 2013, his PSA was 0.27 and most recently, his PSA was up to 0.76 with a castrate level of testosterone of 17.7. He received Zytiga 01/15/12 through 04/21/12 and this was stopped due to a rising PSA. Taxotere chemotherapy with every other day prednisone started on 04/28/12. He is S/P 8 cycles completed in 09/2012. PSA started to rise again after a close to 50% response in his PSA (from 49 to 27).  Current therapy: Started Jevtana on 10/13/2012. He is here for cycle 4 today. Lupron and monthly Xgeva given at [facility] urology.  Interim History: 67 year old presents today for a follow up visit. He received his first 3 cycles of Jevtana without complications. He reported fatigue lasting one week. States he is eating well. Denies nausea and vomiting. He is still very functional, is able to drive, perform most activities of daily living. His urine flow is reasonable with the help of Flomax, without it he has had some urine stream difficulties. He has not had any deterioration in his quality of life. Having more bone pain. Using Oxycontin TID and Oxycodone for breakthrough pain. No recent hospitalizations or illnesses. No neuropathy noted at this time. Would like to increase his Oxycontin. Slight rash on his leg and chest that is improving. Using Benadryl BID. Overall, clinically stable.    Medications: I have  reviewed the patient's current medications. Current Outpatient Prescriptions  Medication Sig Dispense Refill  . diphenhydrAMINE (SOMINEX) 25 MG tablet Take 25 mg by mouth 2 (two) times daily.      Marland Kitchen amLODipine (NORVASC) 10 MG tablet Take 1 tablet (10 mg total) by mouth every evening.  30 tablet  9  . atenolol (TENORMIN) 50 MG tablet Take 1.5 tablets (75 mg total) by mouth every evening.  30 tablet  9  . Calcium Carbonate-Vitamin D (CALCIUM 600+D) 600-400 MG-UNIT per tablet Take 2 tablets by mouth daily.       Marland Kitchen denosumab (XGEVA) 120 MG/1.7ML SOLN Inject 120 mg into the skin every 30 (thirty) days. Patient receives injection in drs office      . docusate sodium (COLACE) 100 MG capsule Take 300 mg by mouth daily.       Marland Kitchen leuprolide (LUPRON DEPOT) 22.5 MG injection Inject 22.5 mg into the muscle every 6 (six) months. Or as directed by urology  1 each  0  . lidocaine-prilocaine (EMLA) cream Apply topically as needed. Apply approximately 1/2 teaspoon to skin over port-a-cath. Do not rub in. Place clear plastic over cream. 1-1 and 1/2 hours prior to chemotherapy treatment.  30 g  2  . ondansetron (ZOFRAN) 8 MG tablet TAKE 1 TABLET BY MOUTH EVERY 8 HOURS AS NEEDED FOR NAUSEA  20 tablet  0  . OxyCODONE (OXYCONTIN) 10 mg T12A 12 hr tablet Take 1 tablet (10 mg total) by mouth 3 (three) times daily.  Take 1 tab 3 times per day with 20 mg to equal 30 mg.  90 tablet  0  . oxyCODONE (OXYCONTIN) 20 MG 12 hr tablet Take 1 tablet (20 mg total) by mouth 3 (three) times daily. Take 1 tablet three times a day with 10 mg tab to equal 30 mg.  90 tablet  0  . Oxycodone HCl 10 MG TABS Take 1 tablet (10 mg total) by mouth every 4 (four) hours as needed.  100 tablet  0  . predniSONE (DELTASONE) 5 MG tablet Take 1 tablet (5 mg total) by mouth every other day. 90 day supply  45 tablet  1  . Tamsulosin HCl (FLOMAX) 0.4 MG CAPS Take 0.4 mg by mouth daily.        No current facility-administered medications for this visit.     Allergies: No Known Allergies  Past Medical History, Surgical history, Social history, and Family History were reviewed and updated.  Review of Systems: Constitutional:  Negative for fever, chills, night sweats, anorexia, weight loss, pain. Cardiovascular: no chest pain or dyspnea on exertion Respiratory: negative Neurological: negative Dermatological: negative ENT: negative Skin: Negative. Gastrointestinal: negative Genito-Urinary: negative Hematological and Lymphatic: negative Breast: negative Musculoskeletal: negative Remaining ROS negative.  Physical Exam: Blood pressure 124/72, pulse 88, temperature 97.7 F (36.5 C), temperature source Oral, resp. rate 20, height 5\' 2"  (1.575 m), weight 192 lb (87.091 kg). ECOG: 1 General appearance: alert Head: Normocephalic, without obvious abnormality, atraumatic Neck: no adenopathy, no carotid bruit, no JVD, supple, symmetrical, trachea midline and thyroid not enlarged, symmetric, no tenderness/mass/nodules Lymph nodes: Cervical, supraclavicular, and axillary nodes normal. Heart:regular rate and rhythm, S1, S2 normal, no murmur, click, rub or gallop Lung:chest clear, no wheezing, rales, normal symmetric air entry Abdomen: soft, non-tender, without masses or organomegaly EXT:no erythema, induration, or nodules. Right upper extremity with 2+ edema.   Lab Results: Lab Results  Component Value Date   WBC 13.6* 12/15/2012   HGB 11.8* 12/15/2012   HCT 37.2* 12/15/2012   MCV 95.6 12/15/2012   PLT 237 12/15/2012     Chemistry      Component Value Date/Time   NA 138 11/24/2012 0833   NA 140 11/01/2011 1325   K 4.1 11/24/2012 0833   K 3.9 11/01/2011 1325   CL 109* 10/13/2012 0801   CL 104 11/01/2011 1325   CO2 20* 11/24/2012 0833   CO2 28 11/01/2011 1325   BUN 9.9 11/24/2012 0833   BUN 9 11/01/2011 1325   CREATININE 0.7 11/24/2012 0833   CREATININE 0.60 11/01/2011 1325      Component Value Date/Time   CALCIUM 8.4 11/24/2012 0833   CALCIUM 9.1  11/01/2011 1325   ALKPHOS 153* 11/24/2012 0833   ALKPHOS 101 11/01/2011 1325   AST 31 11/24/2012 0833   AST 34 11/01/2011 1325   ALT 22 11/24/2012 0833   ALT 37 11/01/2011 1325   BILITOT 0.46 11/24/2012 0833   BILITOT 0.4 11/01/2011 1325     Results for BRITAIN, SABER (MRN [id number]) as of 12/15/2012 08:41  Ref. Range 09/01/2012 07:41 09/22/2012 09:04 10/13/2012 08:01 11/03/2012 08:43 11/24/2012 08:32  PSA Latest Range: <=4.00 ng/mL 39.50 (H) 44.19 (H) 50.01 (H) 40.12 (H) 30.54 (H)    Impression and Plan:  This is a pleasant 67 year old gentleman with the following issues:   1. Prostate cancer. His diagnosis dates back to October of 2010. He had a prostate-specific antigen of 15, Gleason score of 5+ 4=9. He has developed castration-resistant disease. He  received Taxotere without major complications but his PSA is going up slowly from 27 to 44 in 3 months. He had a restaging CT scan and bone scan  showed widespread osseous metastatic disease which was progressive from prior scans. However, pelvic adenopathy was improved. Recommend that he proceed with cycle 4 of Jevtana today without dose modification.   2. Bony disease. Continue Xgeva at [facility].   3. Androgen deprivation.  I will recommend continuing that for the time being. Last given in April 2014.  4. Pain. Having more bony pain without neuro changes. I have increased his OxyContin to 30 mg TID and refilled his oxycodone today.  5. IV access: PAC is place.  6. Neutropenia prophylaxis: On Neulasta with each cycle of chemo.  7. Nausea/vomiting. Continue Zofran. Much improved now.  8. Weight gain: Better since changing Prednisone to QOD.   9. right upper extremity edema: Likely dependent edema improved now.   10. Follow-up: 3 weeks for cycle 5 of Jevtana.     Clenton Pare 12/15/2012

## 2012-12-16 ENCOUNTER — Other Ambulatory Visit: Payer: Self-pay | Admitting: Oncology

## 2012-12-16 ENCOUNTER — Ambulatory Visit (HOSPITAL_BASED_OUTPATIENT_CLINIC_OR_DEPARTMENT_OTHER): Payer: Medicare Other

## 2012-12-16 VITALS — BP 118/72 | HR 112 | Temp 98.7°F

## 2012-12-16 DIAGNOSIS — C7951 Secondary malignant neoplasm of bone: Secondary | ICD-10-CM

## 2012-12-16 DIAGNOSIS — C61 Malignant neoplasm of prostate: Secondary | ICD-10-CM

## 2012-12-16 MED ORDER — PEGFILGRASTIM INJECTION 6 MG/0.6ML
6.0000 mg | Freq: Once | SUBCUTANEOUS | Status: AC
  Administered 2012-12-16: 6 mg via SUBCUTANEOUS
  Filled 2012-12-16: qty 0.6

## 2013-01-03 ENCOUNTER — Other Ambulatory Visit: Payer: Self-pay | Admitting: Internal Medicine

## 2013-01-05 ENCOUNTER — Ambulatory Visit (HOSPITAL_BASED_OUTPATIENT_CLINIC_OR_DEPARTMENT_OTHER): Payer: Medicare Other

## 2013-01-05 ENCOUNTER — Ambulatory Visit (HOSPITAL_BASED_OUTPATIENT_CLINIC_OR_DEPARTMENT_OTHER): Payer: Medicare Other | Admitting: Oncology

## 2013-01-05 ENCOUNTER — Telehealth: Payer: Self-pay | Admitting: *Deleted

## 2013-01-05 ENCOUNTER — Other Ambulatory Visit (HOSPITAL_BASED_OUTPATIENT_CLINIC_OR_DEPARTMENT_OTHER): Payer: Medicare Other | Admitting: Lab

## 2013-01-05 ENCOUNTER — Telehealth: Payer: Self-pay | Admitting: Oncology

## 2013-01-05 VITALS — BP 136/81 | HR 119 | Temp 98.2°F | Resp 20 | Ht 62.0 in | Wt 187.3 lb

## 2013-01-05 DIAGNOSIS — C7951 Secondary malignant neoplasm of bone: Secondary | ICD-10-CM

## 2013-01-05 DIAGNOSIS — G893 Neoplasm related pain (acute) (chronic): Secondary | ICD-10-CM

## 2013-01-05 DIAGNOSIS — C61 Malignant neoplasm of prostate: Secondary | ICD-10-CM

## 2013-01-05 DIAGNOSIS — E291 Testicular hypofunction: Secondary | ICD-10-CM

## 2013-01-05 DIAGNOSIS — Z5111 Encounter for antineoplastic chemotherapy: Secondary | ICD-10-CM

## 2013-01-05 DIAGNOSIS — R0602 Shortness of breath: Secondary | ICD-10-CM

## 2013-01-05 LAB — CBC WITH DIFFERENTIAL/PLATELET
Eosinophils Absolute: 0.2 10*3/uL (ref 0.0–0.5)
LYMPH%: 20.4 % (ref 14.0–49.0)
MCHC: 31.9 g/dL — ABNORMAL LOW (ref 32.0–36.0)
MCV: 95.7 fL (ref 79.3–98.0)
MONO%: 8.1 % (ref 0.0–14.0)
NEUT#: 11.4 10*3/uL — ABNORMAL HIGH (ref 1.5–6.5)
Platelets: 214 10*3/uL (ref 140–400)
RBC: 3.93 10*6/uL — ABNORMAL LOW (ref 4.20–5.82)

## 2013-01-05 LAB — COMPREHENSIVE METABOLIC PANEL (CC13)
Alkaline Phosphatase: 154 U/L — ABNORMAL HIGH (ref 40–150)
BUN: 9.8 mg/dL (ref 7.0–26.0)
Glucose: 116 mg/dl (ref 70–140)
Sodium: 140 mEq/L (ref 136–145)
Total Bilirubin: 0.35 mg/dL (ref 0.20–1.20)
Total Protein: 6 g/dL — ABNORMAL LOW (ref 6.4–8.3)

## 2013-01-05 MED ORDER — ONDANSETRON 8 MG/NS 50 ML IVPB
INTRAVENOUS | Status: AC
  Filled 2013-01-05: qty 8

## 2013-01-05 MED ORDER — DEXAMETHASONE SODIUM PHOSPHATE 20 MG/5ML IJ SOLN
12.0000 mg | Freq: Once | INTRAMUSCULAR | Status: AC
  Administered 2013-01-05: 12 mg via INTRAVENOUS

## 2013-01-05 MED ORDER — SODIUM CHLORIDE 0.9 % IV SOLN
Freq: Once | INTRAVENOUS | Status: AC
  Administered 2013-01-05: 11:00:00 via INTRAVENOUS

## 2013-01-05 MED ORDER — DIPHENHYDRAMINE HCL 50 MG/ML IJ SOLN
25.0000 mg | Freq: Once | INTRAMUSCULAR | Status: AC
  Administered 2013-01-05: 25 mg via INTRAVENOUS

## 2013-01-05 MED ORDER — HEPARIN SOD (PORK) LOCK FLUSH 100 UNIT/ML IV SOLN
500.0000 [IU] | Freq: Once | INTRAVENOUS | Status: AC | PRN
  Administered 2013-01-05: 500 [IU]
  Filled 2013-01-05: qty 5

## 2013-01-05 MED ORDER — OXYCODONE HCL 20 MG PO TB12: 20.0000 mg | ORAL_TABLET | Freq: Three times a day (TID) | ORAL | Status: DC

## 2013-01-05 MED ORDER — FAMOTIDINE IN NACL 20-0.9 MG/50ML-% IV SOLN
INTRAVENOUS | Status: AC
  Filled 2013-01-05: qty 50

## 2013-01-05 MED ORDER — OXYCODONE HCL 10 MG PO TABS: 10.0000 mg | ORAL_TABLET | ORAL | Status: DC | PRN

## 2013-01-05 MED ORDER — DEXTROSE 5 % IV SOLN
25.0000 mg/m2 | Freq: Once | INTRAVENOUS | Status: AC
  Administered 2013-01-05: 50 mg via INTRAVENOUS
  Filled 2013-01-05: qty 5

## 2013-01-05 MED ORDER — DEXAMETHASONE SODIUM PHOSPHATE 20 MG/5ML IJ SOLN
INTRAMUSCULAR | Status: AC
  Filled 2013-01-05: qty 5

## 2013-01-05 MED ORDER — SODIUM CHLORIDE 0.9 % IJ SOLN
10.0000 mL | INTRAMUSCULAR | Status: DC | PRN
  Administered 2013-01-05: 10 mL
  Filled 2013-01-05: qty 10

## 2013-01-05 MED ORDER — ONDANSETRON 8 MG/50ML IVPB (CHCC)
8.0000 mg | Freq: Once | INTRAVENOUS | Status: AC
  Administered 2013-01-05: 8 mg via INTRAVENOUS

## 2013-01-05 MED ORDER — OXYCODONE HCL ER 10 MG PO T12A: 10.0000 mg | EXTENDED_RELEASE_TABLET | Freq: Three times a day (TID) | ORAL | Status: DC

## 2013-01-05 MED ORDER — DIPHENHYDRAMINE HCL 50 MG/ML IJ SOLN
INTRAMUSCULAR | Status: AC
  Filled 2013-01-05: qty 1

## 2013-01-05 MED ORDER — FAMOTIDINE IN NACL 20-0.9 MG/50ML-% IV SOLN
20.0000 mg | Freq: Once | INTRAVENOUS | Status: AC
  Administered 2013-01-05: 20 mg via INTRAVENOUS

## 2013-01-05 NOTE — Telephone Encounter (Signed)
Per staff message and POF I have scheduled appts.  JMW  

## 2013-01-05 NOTE — Telephone Encounter (Signed)
Gave pt appt for October lab and MD emailed Marcelino Duster regarding chemo

## 2013-01-05 NOTE — Progress Notes (Signed)
Hematology and Oncology Follow Up Visit  Jeff Jimenez [id number] 1946/04/13 67 y.o. 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  Gleason score 5+4=9 in 5 out of the 8 cores, PSA 15.  Prior Therapy: He was started on Lupron and Casodex for his prostate cancer. His PSA nadir down to 0.02 back in March of 2012. In July of 2012, his PSA was up to 0.6. Casodex was stopped temporarily up until November of 2012. His PSA went up to 0.09. Casodex was restarted in November of 2012. In March of 2013, his PSA was 0.27 and most recently, his PSA was up to 0.76 with a castrate level of testosterone of 17.7. He received Zytiga 01/15/12 through 04/21/12 and this was stopped due to a rising PSA. Taxotere chemotherapy with every other day prednisone started on 04/28/12. He is S/P 8 cycles completed in 09/2012. PSA started to rise again after a close to 50% response in his PSA (from 49 to 27).  Current therapy: Started Jevtana on 10/13/2012. He is here for cycle 5 today. Lupron and monthly Xgeva given at [facility] urology.  Interim History: 67 year old presents today for a follow up visit. He received his first 4 cycles of Jevtana without complications. He reported fatigue lasting one week. States he is eating well. Denies nausea and vomiting. He is perform most activities of daily living. His urine flow is reasonable with the help of Flomax, without it he has had some urine stream difficulties. He has not had any deterioration in his quality of life. Having more bone pain. Using Oxycontin TID and Oxycodone for breakthrough pain. No recent hospitalizations or illnesses. No neuropathy noted at this time. Doing better with increased OxyContin. He is reporting more SOB with activity. No chest pain or cough. At rest he feels normal but unable to be as active.    Medications: I have reviewed the patient's current  medications. Current Outpatient Prescriptions  Medication Sig Dispense Refill  . amLODipine (NORVASC) 10 MG tablet Take 1 tablet (10 mg total) by mouth every evening.  30 tablet  9  . atenolol (TENORMIN) 50 MG tablet Take 1.5 tablets (75 mg total) by mouth every evening.  30 tablet  9  . atenolol (TENORMIN) 50 MG tablet TAKE 1.5 TABLETS (75 MG TOTAL) BY MOUTH DAILY.  45 tablet  5  . Calcium Carbonate-Vitamin D (CALCIUM 600+D) 600-400 MG-UNIT per tablet Take 2 tablets by mouth daily.       Marland Kitchen denosumab (XGEVA) 120 MG/1.7ML SOLN Inject 120 mg into the skin every 30 (thirty) days. Patient receives injection in drs office      . diphenhydrAMINE (SOMINEX) 25 MG tablet Take 25 mg by mouth 2 (two) times daily.      Marland Kitchen docusate sodium (COLACE) 100 MG capsule Take 300 mg by mouth daily.       Marland Kitchen leuprolide (LUPRON DEPOT) 22.5 MG injection Inject 22.5 mg into the muscle every 6 (six) months. Or as directed by urology  1 each  0  . lidocaine-prilocaine (EMLA) cream Apply topically as needed. Apply approximately 1/2 teaspoon to skin over port-a-cath. Do not rub in. Place clear plastic over cream. 1-1 and 1/2 hours prior to chemotherapy treatment.  30 g  2  . ondansetron (ZOFRAN) 8 MG tablet TAKE 1 TABLET BY MOUTH EVERY 8 HOURS AS NEEDED FOR NAUSEA  20 tablet  1  .  OxyCODONE (OXYCONTIN) 10 mg T12A 12 hr tablet Take 1 tablet (10 mg total) by mouth 3 (three) times daily. Take 1 tab 3 times per day with 20 mg to equal 30 mg.  90 tablet  0  . oxyCODONE (OXYCONTIN) 20 MG 12 hr tablet Take 1 tablet (20 mg total) by mouth 3 (three) times daily. Take 1 tablet three times a day with 10 mg tab to equal 30 mg.  90 tablet  0  . Oxycodone HCl 10 MG TABS Take 1 tablet (10 mg total) by mouth every 4 (four) hours as needed.  100 tablet  0  . predniSONE (DELTASONE) 5 MG tablet Take 1 tablet (5 mg total) by mouth every other day. 90 day supply  45 tablet  1  . predniSONE (DELTASONE) 5 MG tablet Take 5 mg by mouth daily.      .  Tamsulosin HCl (FLOMAX) 0.4 MG CAPS Take 0.4 mg by mouth daily.        No current facility-administered medications for this visit.    Allergies: No Known Allergies  Past Medical History, Surgical history, Social history, and Family History were reviewed and updated.  Review of Systems:  Remaining ROS negative.  Physical Exam: Blood pressure 136/81, pulse 119, temperature 98.2 F (36.8 C), temperature source Oral, resp. rate 20, height 5\' 2"  (1.575 m), weight 187 lb 4.8 oz (84.959 kg). ECOG: 1 General appearance: alert Head: Normocephalic, without obvious abnormality, atraumatic Neck: no adenopathy, no carotid bruit, no JVD, supple, symmetrical, trachea midline and thyroid not enlarged, symmetric, no tenderness/mass/nodules Lymph nodes: Cervical, supraclavicular, and axillary nodes normal. Heart:regular rate and rhythm, S1, S2 normal, no murmur, click, rub or gallop Lung:chest clear, no wheezing, rales, normal symmetric air entry Abdomen: soft, non-tender, without masses or organomegaly EXT:no erythema, induration, or nodules. Right upper extremity with 2+ edema.   Lab Results: Lab Results  Component Value Date   WBC 16.3* 01/05/2013   HGB 12.0* 01/05/2013   HCT 37.6* 01/05/2013   MCV 95.7 01/05/2013   PLT 214 01/05/2013     Chemistry      Component Value Date/Time   NA 139 12/15/2012 0800   NA 140 11/01/2011 1325   K 4.4 12/15/2012 0800   K 3.9 11/01/2011 1325   CL 109* 10/13/2012 0801   CL 104 11/01/2011 1325   CO2 18* 12/15/2012 0800   CO2 28 11/01/2011 1325   BUN 10.3 12/15/2012 0800   BUN 9 11/01/2011 1325   CREATININE 0.6* 12/15/2012 0800   CREATININE 0.60 11/01/2011 1325      Component Value Date/Time   CALCIUM 7.7* 12/15/2012 0800   CALCIUM 9.1 11/01/2011 1325   ALKPHOS 193* 12/15/2012 0800   ALKPHOS 101 11/01/2011 1325   AST 40* 12/15/2012 0800   AST 34 11/01/2011 1325   ALT 24 12/15/2012 0800   ALT 37 11/01/2011 1325   BILITOT 0.42 12/15/2012 0800   BILITOT 0.4 11/01/2011  1325      Results for Jeff Jimenez (MRN [id number]) as of 01/05/2013 09:31  Ref. Range 11/03/2012 08:43 11/24/2012 08:32  PSA Latest Range: <=4.00 ng/mL 40.12 (H) 30.54 (H)    Impression and Plan:  This is a pleasant 67 year old gentleman with the following issues:   1. Prostate cancer. His diagnosis dates back to October of 2010. He had a prostate-specific antigen of 15, Gleason score of 5+ 4=9. He has developed castration-resistant disease. He received Taxotere without major complications but his PSA is going up slowly from  27 to 44 in 3 months. He had a restaging CT scan and bone scan  showed widespread osseous metastatic disease which was progressive from prior scans. However, pelvic adenopathy was improved. Recommend that he proceed with cycle 5 of Jevtana today without dose modification.   2. Bony disease. Continue Xgeva at [facility].   3. Androgen deprivation.  I will recommend continuing that for the time being. Last given in April 2014.  4. Pain. Doing better on OxyContin to 30 mg TID. Refilled today with his oxycodone today.  5. IV access: PAC is place.  6. Neutropenia prophylaxis: On Neulasta with each cycle of chemotherapy.  7. Nausea/vomiting. Continue Zofran. Much improved now.  8. Weight gain: Better since changing Prednisone to QOD.   9. Right upper extremity edema: Likely dependent edema improved now.   10. Shortens of breath with activity: possible due to pleural effusion. His exam did not support that today. If his symptoms worsens, we will repeat CT scan. Ct scan from 10/2012 was reviewed today and showed little effusion.  11. Follow-up: 3 weeks for cycle 6 of Jevtana.     [facility] 01/05/2013

## 2013-01-05 NOTE — Patient Instructions (Addendum)
[facility] Discharge Instructions for Patients Receiving Chemotherapy  Today you received the following chemotherapy agents: Jevtana  To help prevent nausea and vomiting after your treatment, we encourage you to take your nausea medication as directed by your physician.   If you develop nausea and vomiting that is not controlled by your nausea medication, call the clinic.   BELOW ARE SYMPTOMS THAT SHOULD BE REPORTED IMMEDIATELY:  *FEVER GREATER THAN 100.5 F  *CHILLS WITH OR WITHOUT FEVER  NAUSEA AND VOMITING THAT IS NOT CONTROLLED WITH YOUR NAUSEA MEDICATION  *UNUSUAL SHORTNESS OF BREATH  *UNUSUAL BRUISING OR BLEEDING  TENDERNESS IN MOUTH AND THROAT WITH OR WITHOUT PRESENCE OF ULCERS  *URINARY PROBLEMS  *BOWEL PROBLEMS  UNUSUAL RASH Items with * indicate a potential emergency and should be followed up as soon as possible.  Feel free to call the clinic you have any questions or concerns. The clinic phone number is [phone].

## 2013-01-06 ENCOUNTER — Telehealth: Payer: Self-pay | Admitting: *Deleted

## 2013-01-06 ENCOUNTER — Ambulatory Visit (HOSPITAL_BASED_OUTPATIENT_CLINIC_OR_DEPARTMENT_OTHER): Payer: Medicare Other

## 2013-01-06 VITALS — BP 122/60 | HR 92 | Temp 99.1°F

## 2013-01-06 DIAGNOSIS — Z5189 Encounter for other specified aftercare: Secondary | ICD-10-CM

## 2013-01-06 DIAGNOSIS — C61 Malignant neoplasm of prostate: Secondary | ICD-10-CM

## 2013-01-06 DIAGNOSIS — C7951 Secondary malignant neoplasm of bone: Secondary | ICD-10-CM

## 2013-01-06 MED ORDER — PEGFILGRASTIM INJECTION 6 MG/0.6ML
6.0000 mg | Freq: Once | SUBCUTANEOUS | Status: AC
  Administered 2013-01-06: 6 mg via SUBCUTANEOUS
  Filled 2013-01-06: qty 0.6

## 2013-01-06 NOTE — Telephone Encounter (Signed)
Message copied by Reesa Chew on Wed Jan 06, 2013 10:16 AM ------      Message from: Benjiman Core      Created: Wed Jan 06, 2013  8:06 AM       Please call his PSA. Not changed much. ------

## 2013-01-06 NOTE — Telephone Encounter (Signed)
Gave patient results of PSA done yestersday

## 2013-01-07 ENCOUNTER — Telehealth: Payer: Self-pay | Admitting: Oncology

## 2013-01-07 NOTE — Telephone Encounter (Signed)
Talked to pt he is aware of all appts for October

## 2013-01-20 ENCOUNTER — Ambulatory Visit: Payer: Medicare Other | Admitting: Internal Medicine

## 2013-01-26 ENCOUNTER — Ambulatory Visit (HOSPITAL_BASED_OUTPATIENT_CLINIC_OR_DEPARTMENT_OTHER): Payer: Medicare Other

## 2013-01-26 ENCOUNTER — Other Ambulatory Visit (HOSPITAL_BASED_OUTPATIENT_CLINIC_OR_DEPARTMENT_OTHER): Payer: Medicare Other | Admitting: Lab

## 2013-01-26 ENCOUNTER — Ambulatory Visit (HOSPITAL_BASED_OUTPATIENT_CLINIC_OR_DEPARTMENT_OTHER): Payer: Medicare Other | Admitting: Oncology

## 2013-01-26 VITALS — BP 125/73 | HR 82 | Temp 98.6°F | Resp 20 | Ht 62.0 in | Wt 184.2 lb

## 2013-01-26 DIAGNOSIS — C61 Malignant neoplasm of prostate: Secondary | ICD-10-CM

## 2013-01-26 DIAGNOSIS — M899 Disorder of bone, unspecified: Secondary | ICD-10-CM

## 2013-01-26 DIAGNOSIS — R112 Nausea with vomiting, unspecified: Secondary | ICD-10-CM

## 2013-01-26 DIAGNOSIS — C7951 Secondary malignant neoplasm of bone: Secondary | ICD-10-CM

## 2013-01-26 DIAGNOSIS — Z5111 Encounter for antineoplastic chemotherapy: Secondary | ICD-10-CM

## 2013-01-26 LAB — COMPREHENSIVE METABOLIC PANEL (CC13)
AST: 26 U/L (ref 5–34)
Alkaline Phosphatase: 128 U/L (ref 40–150)
BUN: 15.2 mg/dL (ref 7.0–26.0)
Creatinine: 0.6 mg/dL — ABNORMAL LOW (ref 0.7–1.3)
Glucose: 116 mg/dl (ref 70–140)

## 2013-01-26 LAB — PSA: PSA: 62.76 ng/mL — ABNORMAL HIGH (ref <=4.00)

## 2013-01-26 LAB — CBC WITH DIFFERENTIAL/PLATELET
BASO%: 0.6 % (ref 0.0–2.0)
Basophils Absolute: 0.1 10*3/uL (ref 0.0–0.1)
EOS%: 1.4 % (ref 0.0–7.0)
HCT: 36.1 % — ABNORMAL LOW (ref 38.4–49.9)
HGB: 11.5 g/dL — ABNORMAL LOW (ref 13.0–17.1)
LYMPH%: 16.1 % (ref 14.0–49.0)
MCH: 30 pg (ref 27.2–33.4)
MCHC: 31.9 g/dL — ABNORMAL LOW (ref 32.0–36.0)
MCV: 94.3 fL (ref 79.3–98.0)
MONO%: 8 % (ref 0.0–14.0)
NEUT%: 73.9 % (ref 39.0–75.0)
Platelets: 200 10*3/uL (ref 140–400)
lymph#: 2.1 10*3/uL (ref 0.9–3.3)

## 2013-01-26 MED ORDER — OXYCODONE HCL ER 10 MG PO T12A: EXTENDED_RELEASE_TABLET | ORAL | Status: DC

## 2013-01-26 MED ORDER — FAMOTIDINE IN NACL 20-0.9 MG/50ML-% IV SOLN
20.0000 mg | Freq: Once | INTRAVENOUS | Status: AC
Start: 2013-01-26 — End: 2013-01-26
  Administered 2013-01-26: 20 mg via INTRAVENOUS

## 2013-01-26 MED ORDER — DIPHENHYDRAMINE HCL 50 MG/ML IJ SOLN
INTRAMUSCULAR | Status: AC
  Filled 2013-01-26: qty 1

## 2013-01-26 MED ORDER — HEPARIN SOD (PORK) LOCK FLUSH 100 UNIT/ML IV SOLN
500.0000 [IU] | Freq: Once | INTRAVENOUS | Status: AC | PRN
  Administered 2013-01-26: 500 [IU]
  Filled 2013-01-26: qty 5

## 2013-01-26 MED ORDER — SODIUM CHLORIDE 0.9 % IJ SOLN
10.0000 mL | INTRAMUSCULAR | Status: DC | PRN
  Administered 2013-01-26: 10 mL
  Filled 2013-01-26: qty 10

## 2013-01-26 MED ORDER — ONDANSETRON 8 MG/NS 50 ML IVPB
INTRAVENOUS | Status: AC
  Filled 2013-01-26: qty 8

## 2013-01-26 MED ORDER — DIPHENHYDRAMINE HCL 50 MG/ML IJ SOLN
25.0000 mg | Freq: Once | INTRAMUSCULAR | Status: AC
  Administered 2013-01-26: 25 mg via INTRAVENOUS

## 2013-01-26 MED ORDER — OXYCODONE HCL 10 MG PO TABS: 10.0000 mg | ORAL_TABLET | ORAL | Status: DC | PRN

## 2013-01-26 MED ORDER — DEXTROSE 5 % IV SOLN
25.0000 mg/m2 | Freq: Once | INTRAVENOUS | Status: AC
  Administered 2013-01-26: 50 mg via INTRAVENOUS
  Filled 2013-01-26: qty 5

## 2013-01-26 MED ORDER — DEXAMETHASONE SODIUM PHOSPHATE 20 MG/5ML IJ SOLN
12.0000 mg | Freq: Once | INTRAMUSCULAR | Status: AC
  Administered 2013-01-26: 12 mg via INTRAVENOUS

## 2013-01-26 MED ORDER — FAMOTIDINE IN NACL 20-0.9 MG/50ML-% IV SOLN
INTRAVENOUS | Status: AC
  Filled 2013-01-26: qty 50

## 2013-01-26 MED ORDER — OXYCODONE HCL ER 20 MG PO T12A: EXTENDED_RELEASE_TABLET | ORAL | Status: DC

## 2013-01-26 MED ORDER — DEXAMETHASONE SODIUM PHOSPHATE 20 MG/5ML IJ SOLN
INTRAMUSCULAR | Status: AC
  Filled 2013-01-26: qty 5

## 2013-01-26 MED ORDER — SODIUM CHLORIDE 0.9 % IV SOLN
Freq: Once | INTRAVENOUS | Status: AC
  Administered 2013-01-26: 11:00:00 via INTRAVENOUS

## 2013-01-26 MED ORDER — ONDANSETRON 8 MG/50ML IVPB (CHCC)
8.0000 mg | Freq: Once | INTRAVENOUS | Status: AC
Start: 2013-01-26 — End: 2013-01-26
  Administered 2013-01-26: 8 mg via INTRAVENOUS

## 2013-01-26 NOTE — Progress Notes (Signed)
Hematology and Oncology Follow Up Visit  Jeff Jimenez [id number] 1946/03/08 67 y.o. 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  Gleason score 5+4=9 in 5 out of the 8 cores, PSA 15.  Prior Therapy: He was started on Lupron and Casodex for his prostate cancer. His PSA nadir down to 0.02 back in March of 2012. In July of 2012, his PSA was up to 0.6. Casodex was stopped temporarily up until November of 2012. His PSA went up to 0.09. Casodex was restarted in November of 2012. In March of 2013, his PSA was 0.27 and most recently, his PSA was up to 0.76 with a castrate level of testosterone of 17.7. He received Zytiga 01/15/12 through 04/21/12 and this was stopped due to a rising PSA. Taxotere chemotherapy with every other day prednisone started on 04/28/12. He is S/P 8 cycles completed in 09/2012. PSA started to rise again after a close to 50% response in his PSA (from 49 to 27).  Current therapy: Started Jevtana on 10/13/2012. He is here for cycle 6 today. Lupron and monthly Xgeva given at [facility].  Interim History: 67 year old presents today for a follow up visit. He received his first 5 cycles of Jevtana without complications. He reported fatigue lasting one week. States he is eating well. He reports mild nausea and 1 episode of vomiting about 10 days after chemotherapy but subsequently resolves. He is perform most activities of daily living. His urine flow is reasonable with the help of Flomax, without it he has had some urine stream difficulties. He has not had any deterioration in his quality of life. Having more bone pain. Using Oxycontin TID and Oxycodone for breakthrough pain. No recent hospitalizations or illnesses. No neuropathy noted at this time. Doing better with increased OxyContin. He is reporting  No chest pain or cough. At rest he feels normal but unable to be as  active.    Medications: I have reviewed the patient's current medications. Current Outpatient Prescriptions  Medication Sig Dispense Refill  . amLODipine (NORVASC) 10 MG tablet Take 1 tablet (10 mg total) by mouth every evening.  30 tablet  9  . atenolol (TENORMIN) 50 MG tablet Take 1.5 tablets (75 mg total) by mouth every evening.  30 tablet  9  . atenolol (TENORMIN) 50 MG tablet TAKE 1.5 TABLETS (75 MG TOTAL) BY MOUTH DAILY.  45 tablet  5  . Calcium Carbonate-Vitamin D (CALCIUM 600+D) 600-400 MG-UNIT per tablet Take 2 tablets by mouth daily.       Marland Kitchen denosumab (XGEVA) 120 MG/1.7ML SOLN Inject 120 mg into the skin every 30 (thirty) days. Patient receives injection in drs office      . diphenhydrAMINE (SOMINEX) 25 MG tablet Take 25 mg by mouth 2 (two) times daily.      Marland Kitchen docusate sodium (COLACE) 100 MG capsule Take 300 mg by mouth daily.       Marland Kitchen leuprolide (LUPRON DEPOT) 22.5 MG injection Inject 22.5 mg into the muscle every 6 (six) months. Or as directed by urology  1 each  0  . lidocaine-prilocaine (EMLA) cream Apply topically as needed. Apply approximately 1/2 teaspoon to skin over port-a-cath. Do not rub in. Place clear plastic over cream. 1-1 and 1/2 hours prior to chemotherapy treatment.  30 g  2  . ondansetron (ZOFRAN) 8 MG tablet TAKE 1 TABLET BY MOUTH EVERY 8 HOURS AS  NEEDED FOR NAUSEA  20 tablet  1  . Oxycodone HCl 10 MG TABS Take 1 tablet (10 mg total) by mouth every 4 (four) hours as needed.  100 tablet  0  . predniSONE (DELTASONE) 5 MG tablet Take 1 tablet (5 mg total) by mouth every other day. 90 day supply  45 tablet  1  . predniSONE (DELTASONE) 5 MG tablet Take 5 mg by mouth daily.      . Tamsulosin HCl (FLOMAX) 0.4 MG CAPS Take 0.4 mg by mouth daily.       . OxyCODONE (OXYCONTIN) 10 mg T12A 12 hr tablet Take one tablet every 8 hours.  90 tablet  0  . OxyCODONE (OXYCONTIN) 20 mg T12A 12 hr tablet Take one tablet Every 8 hours.  90 tablet  0   No current facility-administered  medications for this visit.    Allergies: No Known Allergies  Past Medical History, Surgical history, Social history, and Family History were reviewed and updated.  Review of Systems:  Remaining ROS negative.  Physical Exam: Blood pressure 125/73, pulse 82, temperature 98.6 F (37 C), temperature source Oral, resp. rate 20, height 5\' 2"  (1.575 m), weight 184 lb 3.2 oz (83.553 kg). ECOG: 1 General appearance: alert Head: Normocephalic, without obvious abnormality, atraumatic Neck: no adenopathy, no carotid bruit, no JVD, supple, symmetrical, trachea midline and thyroid not enlarged, symmetric, no tenderness/mass/nodules Lymph nodes: Cervical, supraclavicular, and axillary nodes normal. Heart:regular rate and rhythm, S1, S2 normal, no murmur, click, rub or gallop Lung:chest clear, no wheezing, rales, normal symmetric air entry Abdomen: soft, non-tender, without masses or organomegaly EXT:no erythema, induration, or nodules. Right upper extremity with 2+ edema.   Lab Results: Lab Results  Component Value Date   WBC 13.2* 01/26/2013   HGB 11.5* 01/26/2013   HCT 36.1* 01/26/2013   MCV 94.3 01/26/2013   PLT 200 01/26/2013     Chemistry      Component Value Date/Time   NA 140 01/05/2013 0857   NA 140 11/01/2011 1325   K 4.2 01/05/2013 0857   K 3.9 11/01/2011 1325   CL 109* 10/13/2012 0801   CL 104 11/01/2011 1325   CO2 23 01/05/2013 0857   CO2 28 11/01/2011 1325   BUN 9.8 01/05/2013 0857   BUN 9 11/01/2011 1325   CREATININE 0.6* 01/05/2013 0857   CREATININE 0.60 11/01/2011 1325      Component Value Date/Time   CALCIUM 8.2* 01/05/2013 0857   CALCIUM 9.1 11/01/2011 1325   ALKPHOS 154* 01/05/2013 0857   ALKPHOS 101 11/01/2011 1325   AST 30 01/05/2013 0857   AST 34 11/01/2011 1325   ALT 19 01/05/2013 0857   ALT 37 11/01/2011 1325   BILITOT 0.35 01/05/2013 0857   BILITOT 0.4 11/01/2011 1325      Results for Jeff Jimenez, Jeff Jimenez (MRN [id number]) as of 01/26/2013 09:54  Ref. Range 11/24/2012 08:32  01/05/2013 08:57  PSA Latest Range: <=4.00 ng/mL 30.54 (H) 37.71 (H)     Impression and Plan:  This is a pleasant 67 year old gentleman with the following issues:   1. Prostate cancer. His diagnosis dates back to October of 2010. He had a prostate-specific antigen of 15, Gleason score of 5+ 4=9. He has developed castration-resistant disease. He received Taxotere without major complications but his PSA is going up slowly from 27 to 44 in 3 months. He had a restaging CT scan and bone scan  showed widespread osseous metastatic disease which was progressive from prior scans. However, pelvic  adenopathy was improved. Recommend that he proceed with cycle 6 of Jevtana today without dose modification. His last PSA did show a mild increase from 30 to 37 after a decline from 40.   2. Bony disease. Continue Xgeva at [facility].   3. Androgen deprivation.  I will recommend continuing that for the time being. Last given in April 2014.  4. Pain. Doing better on OxyContin to 30 mg TID. Refilled today with his oxycodone today.  5. IV access: PAC is place.  6. Neutropenia prophylaxis: On Neulasta with each cycle of chemotherapy.  7. Nausea/vomiting. Continue Zofran. Much improved now.  8. Weight gain: Better since changing Prednisone to QOD.   9. Right upper extremity edema: Likely dependent edema improved now.   10. Shortens of breath with activity: improved today.   11. Follow-up: 3 weeks for cycle 7 of Jevtana.     [facility] 01/26/2013

## 2013-01-27 ENCOUNTER — Telehealth: Payer: Self-pay | Admitting: *Deleted

## 2013-01-27 ENCOUNTER — Ambulatory Visit (HOSPITAL_BASED_OUTPATIENT_CLINIC_OR_DEPARTMENT_OTHER): Payer: Medicare Other

## 2013-01-27 VITALS — BP 111/63 | HR 90 | Temp 98.7°F

## 2013-01-27 DIAGNOSIS — C61 Malignant neoplasm of prostate: Secondary | ICD-10-CM

## 2013-01-27 DIAGNOSIS — Z5189 Encounter for other specified aftercare: Secondary | ICD-10-CM

## 2013-01-27 DIAGNOSIS — C7951 Secondary malignant neoplasm of bone: Secondary | ICD-10-CM

## 2013-01-27 MED ORDER — PEGFILGRASTIM INJECTION 6 MG/0.6ML
6.0000 mg | Freq: Once | SUBCUTANEOUS | Status: AC
  Administered 2013-01-27: 6 mg via SUBCUTANEOUS
  Filled 2013-01-27: qty 0.6

## 2013-01-27 NOTE — Telephone Encounter (Signed)
Message copied by Connie Hilgert, Gerald Leitz on Wed Jan 27, 2013  9:58 AM ------      Message from: Benjiman Core      Created: Wed Jan 27, 2013  8:54 AM       Please call his PSA. It is up but we will stay with the same treatment for now. ------

## 2013-01-27 NOTE — Telephone Encounter (Signed)
Notified pt per MD, PSA 62.76, it is up but we will stay with the same treatment for now.Pt verbalized understanding. No further concerns.

## 2013-01-29 ENCOUNTER — Ambulatory Visit (INDEPENDENT_AMBULATORY_CARE_PROVIDER_SITE_OTHER): Payer: Medicare Other | Admitting: Internal Medicine

## 2013-01-29 ENCOUNTER — Encounter: Payer: Self-pay | Admitting: Internal Medicine

## 2013-01-29 VITALS — BP 122/78 | HR 93 | Temp 100.1°F | Wt 184.0 lb

## 2013-01-29 DIAGNOSIS — R739 Hyperglycemia, unspecified: Secondary | ICD-10-CM

## 2013-01-29 DIAGNOSIS — R7309 Other abnormal glucose: Secondary | ICD-10-CM

## 2013-01-29 DIAGNOSIS — F411 Generalized anxiety disorder: Secondary | ICD-10-CM

## 2013-01-29 DIAGNOSIS — R509 Fever, unspecified: Secondary | ICD-10-CM

## 2013-01-29 DIAGNOSIS — I10 Essential (primary) hypertension: Secondary | ICD-10-CM

## 2013-01-29 DIAGNOSIS — C61 Malignant neoplasm of prostate: Secondary | ICD-10-CM

## 2013-01-29 NOTE — Patient Instructions (Signed)
It was good to see you today. We have reviewed your prior records including labs and tests today Medications reviewed and updated, no changes recommended at this time. Please schedule flu shot next week Followup in 6 months, sooner if problems

## 2013-01-29 NOTE — Assessment & Plan Note (Signed)
BP Readings from Last 3 Encounters:  01/29/13 122/78  01/27/13 111/63  01/26/13 125/73   07/2011 increased Bbloc, improved The current medical regimen is effective;  continue present plan and medications.

## 2013-01-29 NOTE — Assessment & Plan Note (Signed)
Situational exacerbation by declining health related to metastatic prostate cancer reviewed in depth today The current medical regimen is effective;  continue present plan and medications.

## 2013-01-29 NOTE — Assessment & Plan Note (Signed)
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 Extensive emotional support provided to patient and spouse today

## 2013-01-29 NOTE — Assessment & Plan Note (Signed)
Elevated sugar likely related to prednisone resumed January 2014 with reinitiation of chemotherapy Check A1c q6-12 mo - notes decreased prednisone dose 07/21/2012  The patient is asked to make an attempt to improve diet and exercise patterns to aid in medical management of this problem.

## 2013-01-29 NOTE — Progress Notes (Signed)
Subjective:    Patient ID: Jeff Jimenez, male    DOB: 04/22/1946, 67 y.o.   MRN: [id number]  HPI  Here for followup - reviewed chronic medical issues and interval medical events:  HTN - home BP reviewed: range SBP 130-150s on amlodipine 10mg  once daily with atenolol- BP readings exac by anxiety and pain - no side effects related to med tx  - no edema,chest pain or headache   Metastatic prostate cancer, dx 2010 increasing PSA- reviewed interval oncology and urology notes - s/p XRT to spine mets and 2010 - pain mgmt by uro - overall controlled walking with walker for support, overwhelming fatigue related to chemotherapy  anxiety - uses as needed valium, no increase in dose or frequency reports compliance with ongoing medical treatment and no changes in medication dose or frequency. denies adverse side effects related to current therapy.   Past Medical History  Diagnosis Date  . Shingles 06/2009  . BPH (benign prostatic hyperplasia)   . DIVERTICULITIS, HX OF 2010  . ANEMIA-NOS   . ANXIETY   . HYPERTENSION   . Prostate cancer, primary, with metastasis from prostate to other site 01/2009 dx    T-L spine mets, pelvic adenopathy    Review of Systems  Constitutional: Positive for fatigue. Negative for activity change, appetite change and unexpected weight change.  Gastrointestinal: Positive for constipation. Negative for vomiting and diarrhea.  Musculoskeletal: Positive for back pain. Negative for myalgias.        Objective:   Physical Exam BP 122/78  Pulse 93  Temp(Src) 100.1 F (37.8 C) (Oral)  Wt 184 lb (83.462 kg)  BMI 33.65 kg/m2  SpO2 93% Wt Readings from Last 3 Encounters:  01/29/13 184 lb (83.462 kg)  01/26/13 184 lb 3.2 oz (83.553 kg)  01/05/13 187 lb 4.8 oz (84.959 kg)   Constitutional: He is well-developed and well-nourished. No distress.  Overweight.   Cardiovascular: Normal rate, regular rhythm and normal heart sounds. No murmur heard. no BLE  edema Pulmonary/Chest: Effort normal and breath sounds normal. No respiratory distress. He has no wheezes.  Musculoskeletal:  Back: full range of motion of thoracic and lumbar spine. Non tender to palpation. Sensation intact in all dermatomes of the lower extremities. Full strength to manual muscle testing. the patient is able to heel toe walk without difficulty and ambulates with use of rolling walker Neurological: He is alert and oriented to person, place, and time. Coordination normal.   Lab Results  Component Value Date   WBC 13.2* 01/26/2013   HGB 11.5* 01/26/2013   HCT 36.1* 01/26/2013   PLT 200 01/26/2013   GLUCOSE 116 01/26/2013   CHOL 129 07/23/2012   TRIG 201.0* 07/23/2012   HDL 27.70* 07/23/2012   LDLDIRECT 70.8 07/23/2012   LDLCALC 112* 01/31/2010   ALT 15 01/26/2013   AST 26 01/26/2013   NA 138 01/26/2013   K 4.3 01/26/2013   CL 109* 10/13/2012   CREATININE 0.6* 01/26/2013   BUN 15.2 01/26/2013   CO2 21* 01/26/2013   TSH 1.44 01/30/2011   PSA 62.76* 01/26/2013   INR 1.13 09/08/2012   HGBA1C 5.9 07/23/2012        Assessment & Plan:   see problem list. Medications and labs reviewed today.  Low-grade fever, no localizing symptoms. Chemotherapy 3 days ago reviewed. Patient advised to continue monitoring temperature and contact oncology as instructed if greater than 101.5. Flu shot held today, consider administration next week when afebrile  Time spent with pt/family today  25 minutes, greater than 50% time spent counseling patient on progressive prostate cancer, associated situational depression and medication review. Also review of prior records

## 2013-02-10 ENCOUNTER — Ambulatory Visit: Payer: Medicare Other | Admitting: Hematology and Oncology

## 2013-02-16 ENCOUNTER — Telehealth: Payer: Self-pay | Admitting: *Deleted

## 2013-02-16 ENCOUNTER — Ambulatory Visit (HOSPITAL_BASED_OUTPATIENT_CLINIC_OR_DEPARTMENT_OTHER): Payer: Medicare Other

## 2013-02-16 ENCOUNTER — Ambulatory Visit (HOSPITAL_BASED_OUTPATIENT_CLINIC_OR_DEPARTMENT_OTHER): Payer: Medicare Other | Admitting: Oncology

## 2013-02-16 ENCOUNTER — Encounter: Payer: Self-pay | Admitting: Radiation Oncology

## 2013-02-16 ENCOUNTER — Other Ambulatory Visit (HOSPITAL_BASED_OUTPATIENT_CLINIC_OR_DEPARTMENT_OTHER): Payer: Medicare Other | Admitting: Lab

## 2013-02-16 VITALS — BP 155/68 | HR 81 | Temp 97.9°F | Resp 20 | Ht 62.0 in | Wt 183.6 lb

## 2013-02-16 DIAGNOSIS — E291 Testicular hypofunction: Secondary | ICD-10-CM

## 2013-02-16 DIAGNOSIS — C61 Malignant neoplasm of prostate: Secondary | ICD-10-CM

## 2013-02-16 DIAGNOSIS — C7951 Secondary malignant neoplasm of bone: Secondary | ICD-10-CM

## 2013-02-16 DIAGNOSIS — Z5111 Encounter for antineoplastic chemotherapy: Secondary | ICD-10-CM

## 2013-02-16 LAB — COMPREHENSIVE METABOLIC PANEL (CC13)
AST: 45 U/L — ABNORMAL HIGH (ref 5–34)
Albumin: 2.9 g/dL — ABNORMAL LOW (ref 3.5–5.0)
Anion Gap: 9 mEq/L (ref 3–11)
CO2: 22 mEq/L (ref 22–29)
Chloride: 108 mEq/L (ref 98–109)
Glucose: 118 mg/dl (ref 70–140)
Potassium: 4.3 mEq/L (ref 3.5–5.1)
Sodium: 138 mEq/L (ref 136–145)
Total Protein: 5.9 g/dL — ABNORMAL LOW (ref 6.4–8.3)

## 2013-02-16 LAB — PSA: PSA: 82.82 ng/mL — ABNORMAL HIGH (ref <=4.00)

## 2013-02-16 LAB — CBC WITH DIFFERENTIAL/PLATELET
BASO%: 0.5 % (ref 0.0–2.0)
Basophils Absolute: 0.1 10*3/uL (ref 0.0–0.1)
Eosinophils Absolute: 0.1 10*3/uL (ref 0.0–0.5)
HGB: 10.7 g/dL — ABNORMAL LOW (ref 13.0–17.1)
MCV: 94.2 fL (ref 79.3–98.0)
MONO#: 1.2 10*3/uL — ABNORMAL HIGH (ref 0.1–0.9)
NEUT#: 13.1 10*3/uL — ABNORMAL HIGH (ref 1.5–6.5)
RBC: 3.6 10*6/uL — ABNORMAL LOW (ref 4.20–5.82)
RDW: 17.1 % — ABNORMAL HIGH (ref 11.0–14.6)
WBC: 17.6 10*3/uL — ABNORMAL HIGH (ref 4.0–10.3)
lymph#: 3.1 10*3/uL (ref 0.9–3.3)
nRBC: 1 % — ABNORMAL HIGH (ref 0–0)

## 2013-02-16 MED ORDER — DEXAMETHASONE SODIUM PHOSPHATE 20 MG/5ML IJ SOLN
12.0000 mg | Freq: Once | INTRAMUSCULAR | Status: AC
  Administered 2013-02-16: 12 mg via INTRAVENOUS

## 2013-02-16 MED ORDER — DEXTROSE 5 % IV SOLN
25.0000 mg/m2 | Freq: Once | INTRAVENOUS | Status: AC
  Administered 2013-02-16: 50 mg via INTRAVENOUS
  Filled 2013-02-16: qty 5

## 2013-02-16 MED ORDER — FAMOTIDINE IN NACL 20-0.9 MG/50ML-% IV SOLN
INTRAVENOUS | Status: AC
  Filled 2013-02-16: qty 50

## 2013-02-16 MED ORDER — SODIUM CHLORIDE 0.9 % IJ SOLN
10.0000 mL | INTRAMUSCULAR | Status: DC | PRN
  Administered 2013-02-16: 10 mL
  Filled 2013-02-16: qty 10

## 2013-02-16 MED ORDER — FAMOTIDINE IN NACL 20-0.9 MG/50ML-% IV SOLN
20.0000 mg | Freq: Once | INTRAVENOUS | Status: AC
  Administered 2013-02-16: 20 mg via INTRAVENOUS

## 2013-02-16 MED ORDER — DIPHENHYDRAMINE HCL 50 MG/ML IJ SOLN
25.0000 mg | Freq: Once | INTRAMUSCULAR | Status: AC
  Administered 2013-02-16: 25 mg via INTRAVENOUS

## 2013-02-16 MED ORDER — DEXAMETHASONE SODIUM PHOSPHATE 20 MG/5ML IJ SOLN
INTRAMUSCULAR | Status: AC
  Filled 2013-02-16: qty 5

## 2013-02-16 MED ORDER — ONDANSETRON 8 MG/NS 50 ML IVPB
INTRAVENOUS | Status: AC
  Filled 2013-02-16: qty 8

## 2013-02-16 MED ORDER — HEPARIN SOD (PORK) LOCK FLUSH 100 UNIT/ML IV SOLN
500.0000 [IU] | Freq: Once | INTRAVENOUS | Status: AC | PRN
  Administered 2013-02-16: 500 [IU]
  Filled 2013-02-16: qty 5

## 2013-02-16 MED ORDER — SODIUM CHLORIDE 0.9 % IV SOLN
Freq: Once | INTRAVENOUS | Status: AC
  Administered 2013-02-16: 11:00:00 via INTRAVENOUS

## 2013-02-16 MED ORDER — DIPHENHYDRAMINE HCL 50 MG/ML IJ SOLN
INTRAMUSCULAR | Status: AC
  Filled 2013-02-16: qty 1

## 2013-02-16 MED ORDER — ONDANSETRON 8 MG/50ML IVPB (CHCC)
8.0000 mg | Freq: Once | INTRAVENOUS | Status: AC
  Administered 2013-02-16: 8 mg via INTRAVENOUS

## 2013-02-16 NOTE — Telephone Encounter (Signed)
Per staff message and POF I have scheduled appts.  JMW  

## 2013-02-16 NOTE — Patient Instructions (Signed)
[facility] Discharge Instructions for Patients Receiving Chemotherapy  Today you received the following chemotherapy agents Jevtana.  To help prevent nausea and vomiting after your treatment, we encourage you to take your nausea medication Zofran 8mg  every 8 hours as needed for nausea or vomiting and compazine 10 mg every 6 hours as needed.   If you develop nausea and vomiting that is not controlled by your nausea medication, call the clinic.   BELOW ARE SYMPTOMS THAT SHOULD BE REPORTED IMMEDIATELY:  *FEVER GREATER THAN 100.5 F  *CHILLS WITH OR WITHOUT FEVER  NAUSEA AND VOMITING THAT IS NOT CONTROLLED WITH YOUR NAUSEA MEDICATION  *UNUSUAL SHORTNESS OF BREATH  *UNUSUAL BRUISING OR BLEEDING  TENDERNESS IN MOUTH AND THROAT WITH OR WITHOUT PRESENCE OF ULCERS  *URINARY PROBLEMS  *BOWEL PROBLEMS  UNUSUAL RASH Items with * indicate a potential emergency and should be followed up as soon as possible.  Feel free to call the clinic you have any questions or concerns. The clinic phone number is [phone].

## 2013-02-16 NOTE — Progress Notes (Signed)
Discharged at 1250 pm with spouse.  Ambulatory with rolling seated walker in no distress.

## 2013-02-16 NOTE — Telephone Encounter (Signed)
appts made and printed. Pt is aware that tx will be added. i emailed MW to add the tx. Pt is aware that cs will call w/ MR appt...td 

## 2013-02-16 NOTE — Progress Notes (Signed)
Hematology and Oncology Follow Up Visit  Jeff Jimenez [id number] [birth year] 67 y.o. 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  Gleason score 5+4=9 in 5 out of the 8 cores, PSA 15.  Prior Therapy: He was started on Lupron and Casodex for his prostate cancer. His PSA nadir down to 0.02 back in March of 2012. In July of 2012, his PSA was up to 0.6. Casodex was stopped temporarily up until November of 2012. His PSA went up to 0.09. Casodex was restarted in November of 2012. In March of 2013, his PSA was 0.27 and most recently, his PSA was up to 0.76 with a castrate level of testosterone of 17.7. He received Zytiga 01/15/12 through 04/21/12 and this was stopped due to a rising PSA. Taxotere chemotherapy with every other day prednisone started on 04/28/12. He is S/P 8 cycles completed in 09/2012. PSA started to rise again after a close to 50% response in his PSA (from 49 to 27).  Current therapy: Started Jevtana on 10/13/2012. He is here for cycle 7 today. Lupron and monthly Xgeva given at [facility].  Interim History: 67 year old presents today for a follow up visit. He received his first 6 cycles of Jevtana without complications. He reported fatigue lasting one week. States he is eating well. He reports mild nausea and 1 episode of vomiting about 10 days after chemotherapy but subsequently resolves. He is perform most activities of daily living. His urine flow is reasonable with the help of Flomax, without it he has had some urine stream difficulties. He has not had any deterioration in his quality of life. Having more back pain pain at this time. His pain is predominantly in the lower part and it's described about 6 or 7/10. Status associated with any neurological deficits or symptoms. He is using Oxycontin TID and Oxycodone for breakthrough pain. No recent hospitalizations or  illnesses. No neuropathy noted at this time. Doing better with increased OxyContin of 30 mg every 8 hours. He is reporting  no chest pain or cough. At rest he feels normal but unable to be as active.    Medications: I have reviewed the patient's current medications. Current Outpatient Prescriptions  Medication Sig Dispense Refill  . amLODipine (NORVASC) 10 MG tablet Take 1 tablet (10 mg total) by mouth every evening.  30 tablet  9  . atenolol (TENORMIN) 50 MG tablet Take 1.5 tablets (75 mg total) by mouth every evening.  30 tablet  9  . Calcium Carbonate-Vitamin D (CALCIUM 600+D) 600-400 MG-UNIT per tablet Take 2 tablets by mouth daily.       Marland Kitchen denosumab (XGEVA) 120 MG/1.7ML SOLN Inject 120 mg into the skin every 30 (thirty) days. Patient receives injection in drs office      . diphenhydrAMINE (SOMINEX) 25 MG tablet Take 25 mg by mouth 2 (two) times daily.      Marland Kitchen docusate sodium (COLACE) 100 MG capsule Take 300 mg by mouth daily.       Marland Kitchen leuprolide (LUPRON DEPOT) 22.5 MG injection Inject 22.5 mg into the muscle every 6 (six) months. Or as directed by urology  1 each  0  . lidocaine-prilocaine (EMLA) cream Apply topically as needed. Apply approximately 1/2 teaspoon to skin over port-a-cath. Do not rub in. Place clear plastic over cream. 1-1 and 1/2 hours prior to chemotherapy treatment.  30 g  2  .  ondansetron (ZOFRAN) 8 MG tablet TAKE 1 TABLET BY MOUTH EVERY 8 HOURS AS NEEDED FOR NAUSEA  20 tablet  1  . OxyCODONE (OXYCONTIN) 20 mg T12A 12 hr tablet Take one tablet Every 8 hours.  90 tablet  0  . predniSONE (DELTASONE) 5 MG tablet Take 1 tablet (5 mg total) by mouth every other day. 90 day supply  45 tablet  1  . Tamsulosin HCl (FLOMAX) 0.4 MG CAPS Take 0.4 mg by mouth daily.       . Oxycodone HCl 10 MG TABS Take 1 tablet (10 mg total) by mouth every 4 (four) hours as needed.  100 tablet  0  . prochlorperazine (COMPAZINE) 10 MG tablet Take 10 mg by mouth daily.       No current  facility-administered medications for this visit.    Allergies: No Known Allergies  Past Medical History, Surgical history, Social history, and Family History were reviewed and updated.  Review of Systems:  Remaining ROS negative.  Physical Exam: Blood pressure 155/68, pulse 81, temperature 97.9 F (36.6 C), temperature source Oral, resp. rate 20, height 5\' 2"  (1.575 m), weight 183 lb 9.6 oz (83.28 kg). ECOG: 1 General appearance: alert Head: Normocephalic, without obvious abnormality, atraumatic Neck: no adenopathy, no carotid bruit, no JVD, supple, symmetrical, trachea midline and thyroid not enlarged, symmetric, no tenderness/mass/nodules Lymph nodes: Cervical, supraclavicular, and axillary nodes normal. Heart:regular rate and rhythm, S1, S2 normal, no murmur, click, rub or gallop Lung:chest clear, no wheezing, rales, normal symmetric air entry Abdomen: soft, non-tender, without masses or organomegaly EXT:no erythema, induration, or nodules. Right upper extremity with 2+ edema. Neurological: No motor or sensory deficits noted on today's exam.  Lab Results: Lab Results  Component Value Date   WBC 17.6* 02/16/2013   HGB 10.7* 02/16/2013   HCT 33.9* 02/16/2013   MCV 94.2 02/16/2013   PLT 193 02/16/2013     Chemistry      Component Value Date/Time   NA 138 01/26/2013 0929   NA 140 11/01/2011 1325   K 4.3 01/26/2013 0929   K 3.9 11/01/2011 1325   CL 109* 10/13/2012 0801   CL 104 11/01/2011 1325   CO2 21* 01/26/2013 0929   CO2 28 11/01/2011 1325   BUN 15.2 01/26/2013 0929   BUN 9 11/01/2011 1325   CREATININE 0.6* 01/26/2013 0929   CREATININE 0.60 11/01/2011 1325      Component Value Date/Time   CALCIUM 8.6 01/26/2013 0929   CALCIUM 9.1 11/01/2011 1325   ALKPHOS 128 01/26/2013 0929   ALKPHOS 101 11/01/2011 1325   AST 26 01/26/2013 0929   AST 34 11/01/2011 1325   ALT 15 01/26/2013 0929   ALT 37 11/01/2011 1325   BILITOT 0.42 01/26/2013 0929   BILITOT 0.4 11/01/2011 1325       Results for Jeff Jimenez, Jeff Jimenez (MRN [id number]) as of 02/16/2013 08:55  Ref. Range 01/05/2013 08:57 01/26/2013 09:29  PSA Latest Range: <=4.00 ng/mL 37.71 (H) 62.76 (H)      Impression and Plan:  This is a pleasant 67 year old gentleman with the following issues:   1. Prostate cancer. His diagnosis dates back to October of 2010. He had a prostate-specific antigen of 15, Gleason score of 5+ 4=9. He has developed castration-resistant disease. He received Taxotere without major complications but his PSA is going up slowly from 27 to 44 in 3 months. He had a restaging CT scan and bone scan  showed widespread osseous metastatic disease which was progressive from prior  scans. However, pelvic adenopathy was improved. Recommend that he proceed with cycle 7 of Jevtana today without dose modification. His last PSA did show  increase and if this increase continues, I will switch him to a different agent.   2. Bony disease. Continue Xgeva at [facility].   3. Androgen deprivation.  I will recommend continuing that for the time being. Last given in April 2014.  4. Back Pain. Doing better on OxyContin to 30 mg TID. He is reporting worsening pain in the last few weeks and for that reason L. obtain an MRI and referral for radiation oncology for possible palliative radiation therapy.  5. IV access: PAC is place.  6. Neutropenia prophylaxis: On Neulasta with each cycle of chemotherapy.  7. Nausea/vomiting. Continue Zofran. Much improved now.  8. Weight gain: Better since changing Prednisone to QOD.   9. Right upper extremity edema: Likely dependent edema improved now.   10. Shortens of breath with activity: improved today.   11. Follow-up: 3 weeks for cycle 8 of Jevtana unless his PSA continued to go up we will consider a different salvage regimens.     [facility] 02/16/2013

## 2013-02-16 NOTE — Progress Notes (Addendum)
Histology and Location of Primary Cancer: Prostate to bone mets  Sites of Visceral and Bony Metastatic Disease: pelvic adenoapathy widespread osseous  T-spine mets  Location(s) of Symptomatic Metastases: T-spine  Past/Anticipated chemotherapy by medical oncology, if any: Taxotere chemotherapy with every other day prednisone started on 04/28/12. He is S/P 8 cycles completed in 09/2012. PSA started to rise again after a close to 50% response in his PSA (from 49 to 27).  Current therapy:  Started Jevtana on 10/13/2012. He is here for cycle 7 today. 02/16/13 Dr. Clelia Croft, MRI lumbar spine ordered 02/16/13 pending, f/u 03/09/13 with Jevtana infusion and Dr. Clelia Croft      Pain on a scale of 0-10 is: low back pain 2-3   If Spine Met(s), symptoms, if any, include:low back pain   Bowel/Bladder retention or incontinence=hx, urinary retention on flomax,no dysuria or hematuria  Numbness or weakness in extremities =weakness lower extremities   Current Decadron regimen, if applicable: no decadron, on prednisone qod  Ambulatory status? Walker? Wheelchair?: Walker  Weakness, fatigued SAFETY ISSUES:  Prior radiation? no  Pacemaker/ICD? no    Is the patient on methotrexate? no  Current Complaints / other details: married, 2 daughters, low back pain,Lupron,Xgeva monthly last given 02/01/13 [facility] Dr.Matthew Mena Goes, (last PSA 62.76 01/26/13), MRI ordered 02/16/13 pending, no family members with cancer,  w Swelling right arm, b/l feet from chemotherapy, watery eyes from chemotherapy

## 2013-02-17 ENCOUNTER — Ambulatory Visit
Admission: RE | Admit: 2013-02-17 | Discharge: 2013-02-17 | Disposition: A | Discharge status: Home / Self Care | Payer: Medicare Other | Source: Ambulatory Visit | Attending: Radiation Oncology | Admitting: Radiation Oncology

## 2013-02-17 ENCOUNTER — Ambulatory Visit: Payer: Medicare Other | Admitting: Oncology

## 2013-02-17 ENCOUNTER — Encounter: Payer: Self-pay | Admitting: Radiation Oncology

## 2013-02-17 ENCOUNTER — Ambulatory Visit (HOSPITAL_BASED_OUTPATIENT_CLINIC_OR_DEPARTMENT_OTHER): Payer: Medicare Other

## 2013-02-17 VITALS — BP 118/56 | HR 83 | Temp 98.1°F

## 2013-02-17 VITALS — BP 136/71 | HR 91 | Temp 97.9°F | Resp 20 | Ht 60.0 in | Wt 182.1 lb

## 2013-02-17 DIAGNOSIS — I10 Essential (primary) hypertension: Secondary | ICD-10-CM | POA: Insufficient documentation

## 2013-02-17 DIAGNOSIS — C7951 Secondary malignant neoplasm of bone: Secondary | ICD-10-CM | POA: Insufficient documentation

## 2013-02-17 DIAGNOSIS — Z79899 Other long term (current) drug therapy: Secondary | ICD-10-CM | POA: Insufficient documentation

## 2013-02-17 DIAGNOSIS — C61 Malignant neoplasm of prostate: Secondary | ICD-10-CM

## 2013-02-17 DIAGNOSIS — Z9221 Personal history of antineoplastic chemotherapy: Secondary | ICD-10-CM | POA: Insufficient documentation

## 2013-02-17 DIAGNOSIS — M545 Low back pain, unspecified: Secondary | ICD-10-CM | POA: Insufficient documentation

## 2013-02-17 HISTORY — DX: Anxiety disorder, unspecified: F41.9

## 2013-02-17 HISTORY — DX: Thrombocytopenia, unspecified: D69.6

## 2013-02-17 HISTORY — DX: Paraphimosis: N47.2

## 2013-02-17 MED ORDER — PEGFILGRASTIM INJECTION 6 MG/0.6ML
6.0000 mg | Freq: Once | SUBCUTANEOUS | Status: AC
  Administered 2013-02-17: 6 mg via SUBCUTANEOUS
  Filled 2013-02-17: qty 0.6

## 2013-02-17 NOTE — Progress Notes (Signed)
Radiation Oncology         [phone] ________________________________  Name: Jeff Jimenez MRN: [id number]  Date: 02/17/2013  DOB: 06/28/1945  Jeff Felicity Coyer, MD  Jeff Core, MD     REFERRING PHYSICIAN: Benjiman Core, MD   DIAGNOSIS: The primary encounter diagnosis was Bone metastases. A diagnosis of ADENOCARCINOMA, PROSTATE, METASTATIC was also pertinent to this visit.   HISTORY OF PRESENT ILLNESS::Jeff Jimenez is a 67 y.o. male who is seen for an initial consultation visit. The patient has a diagnosis of metastatic prostate cancer. He was originally diagnosed with advanced prostate cancer in 2012 with pelvic adenopathy and bony metastatic disease. Gleason score of 5+4 equals 9 and PSA equals 15. The patient was started on Lupron and Casodex with a markedly decrease in his PSA level. Casodex was later stopped temporarily. More recently he received Zytiga in 2013 but this was stopped due to a rising PSA level. He then received Taxotere chemotherapy beginning in January of 2014. Current therapy consists of Jevtana which was started on 10/13/2012, Lupron and Xgeva.  The patient complains of significant low back pain which he states has been a chronic issue. It is centered in the lower back near midline, just to the left and right bilaterally of midline. No significant radiation of pain to the legs area no numbness. No bowel dysfunction in terms of loss of control or constipation/bladder retention. The patient currently describes his pain as a 2-3 / 10 but other times this is significantly worse.  I therefore been asked to see the patient today for consideration of palliative radiotherapy at the request of Dr.Shadad. He recently saw the patient and also has ordered an MRI scan of the lumbar spine/pelvis.    PREVIOUS RADIATION THERAPY: No   PAST MEDICAL HISTORY:  has a past medical history of Shingles (06/2009); BPH (benign prostatic hyperplasia); DIVERTICULITIS, HX OF (2010);  ANEMIA-NOS; ANXIETY; HYPERTENSION; Prostate cancer, primary, with metastasis from prostate to other site (01/2009 dx); Paraphimosis; Thrombocytopenia; and Anxiety.     PAST SURGICAL HISTORY: Past Surgical History  Procedure Laterality Date  . Left ankle surgery  1968  . Vasectomy  1978     FAMILY HISTORY: family history includes Glaucoma in his mother; Heart disease in his father; Hypertension in his father and mother.   SOCIAL HISTORY:  reports that he has never smoked. He has never used smokeless tobacco. He reports that he does not drink alcohol or use illicit drugs.   ALLERGIES: Review of patient's allergies indicates no known allergies.   MEDICATIONS:  Current Outpatient Prescriptions  Medication Sig Dispense Refill  . amLODipine (NORVASC) 10 MG tablet Take 1 tablet (10 mg total) by mouth every evening.  30 tablet  9  . atenolol (TENORMIN) 50 MG tablet Take 1.5 tablets (75 mg total) by mouth every evening.  30 tablet  9  . Calcium Carbonate-Vitamin D (CALCIUM 600+D) 600-400 MG-UNIT per tablet Take 2 tablets by mouth daily.       Marland Kitchen denosumab (XGEVA) 120 MG/1.7ML SOLN Inject 120 mg into the skin every 30 (thirty) days. Patient receives injection in drs office      . diphenhydrAMINE (SOMINEX) 25 MG tablet Take 25 mg by mouth 2 (two) times daily.      Marland Kitchen docusate sodium (COLACE) 100 MG capsule Take 300 mg by mouth daily.       Marland Kitchen leuprolide (LUPRON DEPOT) 22.5 MG injection Inject 22.5 mg into the muscle every 6 (six) months. Or as  directed by urology  1 each  0  . lidocaine-prilocaine (EMLA) cream Apply topically as needed. Apply approximately 1/2 teaspoon to skin over port-a-cath. Do not rub in. Place clear plastic over cream. 1-1 and 1/2 hours prior to chemotherapy treatment.  30 g  2  . ondansetron (ZOFRAN) 8 MG tablet TAKE 1 TABLET BY MOUTH EVERY 8 HOURS AS NEEDED FOR NAUSEA  20 tablet  1  . OxyCODONE (OXYCONTIN) 20 mg T12A 12 hr tablet Take one tablet Every 8 hours.  90 tablet  0    . Oxycodone HCl 10 MG TABS Take 1 tablet (10 mg total) by mouth every 4 (four) hours as needed.  100 tablet  0  . predniSONE (DELTASONE) 5 MG tablet Take 1 tablet (5 mg total) by mouth every other day. 90 day supply  45 tablet  1  . prochlorperazine (COMPAZINE) 10 MG tablet Take 10 mg by mouth daily.      . Tamsulosin HCl (FLOMAX) 0.4 MG CAPS Take 0.4 mg by mouth daily.        No current facility-administered medications for this encounter.     REVIEW OF SYSTEMS:  A 15 point review of systems is documented in the electronic medical record. This was obtained by the nursing staff. However, I reviewed this with the patient to discuss relevant findings and make appropriate changes.  Pertinent items are noted in HPI.    PHYSICAL EXAM:  height is 5' (1.524 m) and weight is 182 lb 1.6 oz (82.6 kg). His oral temperature is 97.9 F (36.6 C). His blood pressure is 136/71 and his pulse is 91. His respiration is 20.   General: Well-developed, in no acute distress HEENT: Normocephalic, atraumatic; oral cavity clear Neck: Supple without any lymphadenopathy Cardiovascular: Regular rate and rhythm Respiratory: Clear to auscultation bilaterally GI: Soft, nontender, normal bowel sounds Extremities: No edema present; the patient is nontender to palpation throughout the spine. No tenderness in the hips bilaterally. Neuro: No focal deficits     LABORATORY DATA:  Lab Results  Component Value Date   WBC 17.6* 02/16/2013   HGB 10.7* 02/16/2013   HCT 33.9* 02/16/2013   MCV 94.2 02/16/2013   PLT 193 02/16/2013   Lab Results  Component Value Date   NA 138 02/16/2013   K 4.3 02/16/2013   CL 109* 10/13/2012   CO2 22 02/16/2013   Lab Results  Component Value Date   ALT 13 02/16/2013   AST 45* 02/16/2013   ALKPHOS 157* 02/16/2013   BILITOT 0.35 02/16/2013      RADIOGRAPHY: No results found.     IMPRESSION: The patient has a diagnosis of metastatic prostate cancer with extensive bony  metastatic disease. I have had a chance to review his recent imaging. The patient I believe is an appropriate candidate for radiotherapy to the lower lumbar spine/pelvic region. The patient has an MRI scan pending which will also help to further clarify the appropriate target area.  I discussed with the patient therefore a possible 2-3 week course of treatment. We discussed the rationale of this treatment including the possible side effects and risks of treatment as well. These would mostly consist of possible GI issues but I would anticipate and hope that the patient's treatment would be tolerated well. A treatment plan to the anticipated bony target would minimize the dose to the bowel.  All the patient's questions were answered. He does wish to proceed with palliative radiotherapy at this time.   PLAN: The patient  will be scheduled for a simulation in the near future, ideally completed soon after his upcoming MRI scan. I would anticipate a 2-3 week course of palliative radiotherapy, hopefully a 2 week course will be achievable.    I spent 55 minutes face to face with the patient and more than 50% of that time was spent in counseling and/or coordination of care.    ________________________________   Radene Gunning, MD, PhD

## 2013-02-17 NOTE — Progress Notes (Signed)
Please see the Nurse Progress Note in the MD Initial Consult Encounter for this patient. 

## 2013-02-18 ENCOUNTER — Telehealth: Payer: Self-pay | Admitting: *Deleted

## 2013-02-18 NOTE — Telephone Encounter (Signed)
Listened to voice message on my phone left yesterday after I had left from  work, patient MRI is for November 5th at noon, said MD wanted to do ct sim same day, his phone number#[phone], cell phone [phone] where he can be reached, will inform Dr.Moody 7:30 AM

## 2013-02-18 NOTE — Telephone Encounter (Signed)
Called Ct sim room ,spoke with therapist Pincus Large, asked if anyone had called and let Jeff Jimenez know of his CT-Simulation appt time on 02/24/13 at 4pm?"Yes, I called him", thanked Lenore, 1:29 PM

## 2013-02-24 ENCOUNTER — Ambulatory Visit
Admission: RE | Admit: 2013-02-24 | Discharge: 2013-02-24 | Disposition: A | Discharge status: Home / Self Care | Payer: Medicare Other | Source: Ambulatory Visit | Attending: Radiation Oncology | Admitting: Radiation Oncology

## 2013-02-24 ENCOUNTER — Ambulatory Visit (HOSPITAL_COMMUNITY)
Admission: RE | Admit: 2013-02-24 | Discharge: 2013-02-24 | Disposition: A | Discharge status: Home / Self Care | Payer: Medicare Other | Source: Ambulatory Visit | Attending: Oncology | Admitting: Oncology

## 2013-02-24 DIAGNOSIS — M8448XA Pathological fracture, other site, initial encounter for fracture: Secondary | ICD-10-CM | POA: Insufficient documentation

## 2013-02-24 DIAGNOSIS — C7951 Secondary malignant neoplasm of bone: Secondary | ICD-10-CM | POA: Insufficient documentation

## 2013-02-24 DIAGNOSIS — R5381 Other malaise: Secondary | ICD-10-CM | POA: Insufficient documentation

## 2013-02-24 DIAGNOSIS — N281 Cyst of kidney, acquired: Secondary | ICD-10-CM | POA: Insufficient documentation

## 2013-02-24 DIAGNOSIS — C61 Malignant neoplasm of prostate: Secondary | ICD-10-CM | POA: Insufficient documentation

## 2013-02-24 DIAGNOSIS — R110 Nausea: Secondary | ICD-10-CM | POA: Insufficient documentation

## 2013-02-24 DIAGNOSIS — M5126 Other intervertebral disc displacement, lumbar region: Secondary | ICD-10-CM | POA: Insufficient documentation

## 2013-02-24 DIAGNOSIS — Z510 Encounter for antineoplastic radiation therapy: Secondary | ICD-10-CM | POA: Insufficient documentation

## 2013-02-24 MED ORDER — GADOBENATE DIMEGLUMINE 529 MG/ML IV SOLN
17.0000 mL | Freq: Once | INTRAVENOUS | Status: AC | PRN
  Administered 2013-02-24: 20 mL via INTRAVENOUS

## 2013-03-01 ENCOUNTER — Other Ambulatory Visit: Payer: Self-pay | Admitting: Oncology

## 2013-03-01 ENCOUNTER — Ambulatory Visit
Admission: RE | Admit: 2013-03-01 | Discharge: 2013-03-01 | Disposition: A | Discharge status: Home / Self Care | Payer: Medicare Other | Source: Ambulatory Visit | Attending: Radiation Oncology | Admitting: Radiation Oncology

## 2013-03-01 ENCOUNTER — Encounter: Payer: Self-pay | Admitting: Radiation Oncology

## 2013-03-01 NOTE — Progress Notes (Signed)
Simulation verification note: The patient underwent similar to verification to his pelvis/spine. His isocenter is in good position and the multileaf collimators contoured the treatment volume appropriately.

## 2013-03-02 ENCOUNTER — Ambulatory Visit
Admission: RE | Admit: 2013-03-02 | Discharge: 2013-03-02 | Disposition: A | Discharge status: Home / Self Care | Payer: Medicare Other | Source: Ambulatory Visit | Attending: Radiation Oncology | Admitting: Radiation Oncology

## 2013-03-03 ENCOUNTER — Ambulatory Visit
Admission: RE | Admit: 2013-03-03 | Discharge: 2013-03-03 | Disposition: A | Discharge status: Home / Self Care | Payer: Medicare Other | Source: Ambulatory Visit | Attending: Radiation Oncology | Admitting: Radiation Oncology

## 2013-03-04 ENCOUNTER — Ambulatory Visit
Admission: RE | Admit: 2013-03-04 | Discharge: 2013-03-04 | Disposition: A | Discharge status: Home / Self Care | Payer: Medicare Other | Source: Ambulatory Visit | Attending: Radiation Oncology | Admitting: Radiation Oncology

## 2013-03-05 ENCOUNTER — Ambulatory Visit
Admission: RE | Admit: 2013-03-05 | Discharge: 2013-03-05 | Disposition: A | Discharge status: Home / Self Care | Payer: Medicare Other | Source: Ambulatory Visit | Attending: Radiation Oncology | Admitting: Radiation Oncology

## 2013-03-05 ENCOUNTER — Encounter: Payer: Self-pay | Admitting: Radiation Oncology

## 2013-03-05 VITALS — BP 115/70 | HR 91 | Temp 98.5°F | Resp 20 | Wt 168.9 lb

## 2013-03-05 DIAGNOSIS — C61 Malignant neoplasm of prostate: Secondary | ICD-10-CM

## 2013-03-05 NOTE — Progress Notes (Signed)
Department of Radiation Oncology  Phone:  [phone] Fax:        [phone]  Weekly Treatment Note    Name: Jeff Jimenez Date: 03/05/2013 MRN: [id number] DOB: 27-Jun-1945   Current dose: 12.5 Gy  Current fraction: 5   MEDICATIONS: Current Outpatient Prescriptions  Medication Sig Dispense Refill  . amLODipine (NORVASC) 10 MG tablet Take 1 tablet (10 mg total) by mouth every evening.  30 tablet  9  . atenolol (TENORMIN) 50 MG tablet Take 1.5 tablets (75 mg total) by mouth every evening.  30 tablet  9  . Calcium Carbonate-Vitamin D (CALCIUM 600+D) 600-400 MG-UNIT per tablet Take 2 tablets by mouth daily.       Marland Kitchen denosumab (XGEVA) 120 MG/1.7ML SOLN Inject 120 mg into the skin every 30 (thirty) days. Patient receives injection in drs office      . diphenhydrAMINE (SOMINEX) 25 MG tablet Take 25 mg by mouth 2 (two) times daily.      Marland Kitchen docusate sodium (COLACE) 100 MG capsule Take 300 mg by mouth daily.       Marland Kitchen leuprolide (LUPRON DEPOT) 22.5 MG injection Inject 22.5 mg into the muscle every 6 (six) months. Or as directed by urology  1 each  0  . lidocaine-prilocaine (EMLA) cream Apply topically as needed. Apply approximately 1/2 teaspoon to skin over port-a-cath. Do not rub in. Place clear plastic over cream. 1-1 and 1/2 hours prior to chemotherapy treatment.  30 g  2  . ondansetron (ZOFRAN) 8 MG tablet TAKE 1 TABLET BY MOUTH EVERY 8 HOURS AS NEEDED FOR NAUSEA  20 tablet  1  . OxyCODONE (OXYCONTIN) 20 mg T12A 12 hr tablet Take one tablet Every 8 hours.  90 tablet  0  . Oxycodone HCl 10 MG TABS Take 1 tablet (10 mg total) by mouth every 4 (four) hours as needed.  100 tablet  0  . predniSONE (DELTASONE) 5 MG tablet Take 1 tablet (5 mg total) by mouth every other day. 90 day supply  45 tablet  1  . predniSONE (DELTASONE) 5 MG tablet TAKE 1 TABLET (5 MG TOTAL) BY MOUTH DAILY. OR AS DIRECTED BY PROVIDER  30 tablet  1  . prochlorperazine (COMPAZINE) 10 MG tablet Take 10 mg by mouth daily.       . Tamsulosin HCl (FLOMAX) 0.4 MG CAPS Take 0.4 mg by mouth daily.        No current facility-administered medications for this encounter.     ALLERGIES: Review of patient's allergies indicates no known allergies.   LABORATORY DATA:  Lab Results  Component Value Date   WBC 17.6* 02/16/2013   HGB 10.7* 02/16/2013   HCT 33.9* 02/16/2013   MCV 94.2 02/16/2013   PLT 193 02/16/2013   Lab Results  Component Value Date   NA 138 02/16/2013   K 4.3 02/16/2013   CL 109* 10/13/2012   CO2 22 02/16/2013   Lab Results  Component Value Date   ALT 13 02/16/2013   AST 45* 02/16/2013   ALKPHOS 157* 02/16/2013   BILITOT 0.35 02/16/2013     NARRATIVE: Jeff Jimenez was seen today for weekly treatment management. The chart was checked and the patient's films were reviewed. The patient finished his first week of treatment. His primary complaint is some nausea which has been present really he states for the last couple of weeks. He has had some increased nausea after radiation treatment. He is taking Zofran and Compazine.  PHYSICAL EXAMINATION: weight  is 168 lb 14.4 oz (76.613 kg). His oral temperature is 98.5 F (36.9 C). His blood pressure is 115/70 and his pulse is 91. His respiration is 20.        ASSESSMENT: The patient is doing satisfactorily with treatment.  PLAN: We will continue with the patient's radiation treatment as planned. The patient will begin taking Zofran 30-40 minutes prior to this treatment each day. We will see how this improves his nausea control. He is to let us know early next week if this does not significantly improve things and we can then make further adjustments as necessary.

## 2013-03-05 NOTE — Progress Notes (Signed)
Patient eduction done, radiation book given, discusses s/e, s/s to report, weekly rad txs

## 2013-03-05 NOTE — Progress Notes (Signed)
Weekly rad txs, 23 colon, Xeloda and radiation together tolerating, diarrhea has resolved, not taking cipro anymore, is taking preparation H and a cream , helping his bottom stated patient, no c/o pain 5:32 PM

## 2013-03-05 NOTE — Progress Notes (Signed)
Ptc/o nausea [past 3 days, rad txs 5/15 pelvis, no diarrhea, had bm today, zofran and compazine doesn't help, teaching done on s/s, s/e to report, suggested brat diet,, applesauce before taking nausea mediication, radiation book given, pateint with  Walker no c/opain, teach back given by patient, has imodium at home if diarrhea occursd

## 2013-03-07 ENCOUNTER — Other Ambulatory Visit: Payer: Self-pay | Admitting: Oncology

## 2013-03-08 ENCOUNTER — Ambulatory Visit
Admission: RE | Admit: 2013-03-08 | Discharge: 2013-03-08 | Disposition: A | Discharge status: Home / Self Care | Payer: Medicare Other | Source: Ambulatory Visit | Attending: Radiation Oncology | Admitting: Radiation Oncology

## 2013-03-09 ENCOUNTER — Ambulatory Visit: Payer: Medicare Other

## 2013-03-09 ENCOUNTER — Ambulatory Visit
Admission: RE | Admit: 2013-03-09 | Discharge: 2013-03-09 | Disposition: A | Discharge status: Home / Self Care | Payer: Medicare Other | Source: Ambulatory Visit | Attending: Radiation Oncology | Admitting: Radiation Oncology

## 2013-03-09 ENCOUNTER — Ambulatory Visit (HOSPITAL_BASED_OUTPATIENT_CLINIC_OR_DEPARTMENT_OTHER): Payer: Medicare Other | Admitting: Oncology

## 2013-03-09 ENCOUNTER — Telehealth: Payer: Self-pay | Admitting: Oncology

## 2013-03-09 ENCOUNTER — Other Ambulatory Visit (HOSPITAL_BASED_OUTPATIENT_CLINIC_OR_DEPARTMENT_OTHER): Payer: Medicare Other | Admitting: Lab

## 2013-03-09 VITALS — BP 113/69 | HR 69 | Temp 98.7°F | Resp 20 | Ht 60.0 in | Wt 168.3 lb

## 2013-03-09 DIAGNOSIS — R609 Edema, unspecified: Secondary | ICD-10-CM

## 2013-03-09 DIAGNOSIS — C61 Malignant neoplasm of prostate: Secondary | ICD-10-CM

## 2013-03-09 DIAGNOSIS — M549 Dorsalgia, unspecified: Secondary | ICD-10-CM

## 2013-03-09 DIAGNOSIS — C7951 Secondary malignant neoplasm of bone: Secondary | ICD-10-CM

## 2013-03-09 LAB — CBC WITH DIFFERENTIAL/PLATELET
BASO%: 0.2 % (ref 0.0–2.0)
Basophils Absolute: 0 10*3/uL (ref 0.0–0.1)
EOS%: 0.4 % (ref 0.0–7.0)
Eosinophils Absolute: 0.1 10*3/uL (ref 0.0–0.5)
HCT: 34.5 % — ABNORMAL LOW (ref 38.4–49.9)
HGB: 11 g/dL — ABNORMAL LOW (ref 13.0–17.1)
LYMPH%: 9 % — ABNORMAL LOW (ref 14.0–49.0)
MCH: 29.5 pg (ref 27.2–33.4)
MCHC: 31.9 g/dL — ABNORMAL LOW (ref 32.0–36.0)
MCV: 92.5 fL (ref 79.3–98.0)
MONO#: 1.5 10*3/uL — ABNORMAL HIGH (ref 0.1–0.9)
MONO%: 9.2 % (ref 0.0–14.0)
NEUT#: 13.6 10*3/uL — ABNORMAL HIGH (ref 1.5–6.5)
NEUT%: 81.2 % — ABNORMAL HIGH (ref 39.0–75.0)
Platelets: 231 10*3/uL (ref 140–400)
RBC: 3.73 10*6/uL — ABNORMAL LOW (ref 4.20–5.82)
RDW: 17 % — ABNORMAL HIGH (ref 11.0–14.6)
WBC: 16.7 10*3/uL — ABNORMAL HIGH (ref 4.0–10.3)
lymph#: 1.5 10*3/uL (ref 0.9–3.3)
nRBC: 0 % (ref 0–0)

## 2013-03-09 MED ORDER — OXYCODONE HCL 10 MG PO TABS: 10.0000 mg | ORAL_TABLET | ORAL | Status: DC | PRN

## 2013-03-09 MED ORDER — OXYCODONE HCL ER 20 MG PO T12A: EXTENDED_RELEASE_TABLET | ORAL | Status: DC

## 2013-03-09 MED ORDER — OXYCODONE HCL ER 10 MG PO T12A: EXTENDED_RELEASE_TABLET | ORAL | Status: DC

## 2013-03-09 NOTE — Telephone Encounter (Signed)
gv adn printed appt sched and avs foropt for NOV and DEC...sed added tx.

## 2013-03-09 NOTE — Progress Notes (Signed)
Hematology and Oncology Follow Up Visit  Jeff Jimenez [id number] 15-Jun-1945 67 y.o. 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  Gleason score 5+4=9 in 5 out of the 8 cores, PSA 15.  Prior Therapy: He was started on Lupron and Casodex for his prostate cancer. His PSA nadir down to 0.02 back in March of 2012. In July of 2012, his PSA was up to 0.6. Casodex was stopped temporarily up until November of 2012. His PSA went up to 0.09. Casodex was restarted in November of 2012. In March of 2013, his PSA was 0.27 and most recently, his PSA was up to 0.76 with a castrate level of testosterone of 17.7. He received Zytiga 01/15/12 through 04/21/12 and this was stopped due to a rising PSA. Taxotere chemotherapy with every other day prednisone started on 04/28/12. He is S/P 8 cycles completed in 09/2012. PSA started to rise again after a close to 50% response in his PSA (from 49 to 27).  Current therapy: Started Jevtana on 10/13/2012. He is here for cycle 8 today. Lupron and monthly Xgeva given at [facility] urology. He is currently receiving palliative radiation therapy to the spine and pelvis.  Interim History: 67 year old presents today for a follow up visit. He received his first 7 cycles of Jevtana without complications. He reported fatigue lasting one week. States he is eating better but reports more nausea than usual specially since he started radiation. He is perform most activities of daily living. His urine flow is reasonable with the help of Flomax, without it he has had some urine stream difficulties. He has not had any deterioration in his quality of life. Having more back pain pain at this time. His pain is predominantly in the lower part without any neurological deficits or symptoms. He is using Oxycontin TID and Oxycodone for breakthrough pain. No recent hospitalizations or illnesses.  No neuropathy noted at this time. Doing better with increased OxyContin of 30 mg every 8 hours. He is reporting  no chest pain or cough. He has tolerated radiation therapy otherwise without any complications. He reports Zofran helping with her nausea especially before radiation therapy.   Medications: I have reviewed the patient's current medications. Current Outpatient Prescriptions  Medication Sig Dispense Refill  . amLODipine (NORVASC) 10 MG tablet Take 1 tablet (10 mg total) by mouth every evening.  30 tablet  9  . atenolol (TENORMIN) 50 MG tablet Take 1.5 tablets (75 mg total) by mouth every evening.  30 tablet  9  . Calcium Carbonate-Vitamin D (CALCIUM 600+D) 600-400 MG-UNIT per tablet Take 2 tablets by mouth daily.       Marland Kitchen denosumab (XGEVA) 120 MG/1.7ML SOLN Inject 120 mg into the skin every 30 (thirty) days. Patient receives injection in drs office      . diphenhydrAMINE (SOMINEX) 25 MG tablet Take 25 mg by mouth 2 (two) times daily.      Marland Kitchen docusate sodium (COLACE) 100 MG capsule Take 300 mg by mouth daily.       Marland Kitchen leuprolide (LUPRON DEPOT) 22.5 MG injection Inject 22.5 mg into the muscle every 6 (six) months. Or as directed by urology  1 each  0  . lidocaine-prilocaine (EMLA) cream Apply topically as needed. Apply approximately 1/2 teaspoon to skin over port-a-cath. Do not rub in. Place clear plastic over cream. 1-1 and 1/2 hours prior to chemotherapy treatment.  30  g  2  . ondansetron (ZOFRAN) 8 MG tablet TAKE 1 TABLET BY MOUTH EVERY 8 HOURS AS NEEDED FOR NAUSEA  20 tablet  1  . OxyCODONE (OXYCONTIN) 20 mg T12A 12 hr tablet Take one tablet Every 8 hours.  90 tablet  0  . Oxycodone HCl 10 MG TABS Take 1 tablet (10 mg total) by mouth every 4 (four) hours as needed.  100 tablet  0  . predniSONE (DELTASONE) 5 MG tablet Take 1 tablet (5 mg total) by mouth every other day. 90 day supply  45 tablet  1  . predniSONE (DELTASONE) 5 MG tablet TAKE 1 TABLET (5 MG TOTAL) BY MOUTH DAILY. OR AS DIRECTED  BY PROVIDER  30 tablet  1  . prochlorperazine (COMPAZINE) 10 MG tablet Take 10 mg by mouth daily.      . OxyCODONE (OXYCONTIN) 10 mg T12A 12 hr tablet Take one tablet every 8 hours.  Total is 30 mg every 8 hours.  90 tablet  0  . Tamsulosin HCl (FLOMAX) 0.4 MG CAPS Take 0.4 mg by mouth daily.        No current facility-administered medications for this visit.    Allergies: No Known Allergies  Past Medical History, Surgical history, Social history, and Family History were reviewed and updated.  Review of Systems:  Remaining ROS negative.  Physical Exam: Blood pressure 113/69, pulse 69, temperature 98.7 F (37.1 C), temperature source Oral, resp. rate 20, height 5' (1.524 m), weight 168 lb 4.8 oz (76.34 kg). ECOG: 1 General appearance: alert Head: Normocephalic, without obvious abnormality, atraumatic Neck: no adenopathy, no carotid bruit, no JVD, supple, symmetrical, trachea midline and thyroid not enlarged, symmetric, no tenderness/mass/nodules Lymph nodes: Cervical, supraclavicular, and axillary nodes normal. Heart:regular rate and rhythm, S1, S2 normal, no murmur, click, rub or gallop Lung:chest clear, no wheezing, rales, normal symmetric air entry Abdomen: soft, non-tender, without masses or organomegaly EXT:no erythema, induration, or nodules. Right upper extremity with 2+ edema. Neurological: No motor or sensory deficits noted on today's exam.  Lab Results: Lab Results  Component Value Date   WBC 16.7* 03/09/2013   HGB 11.0* 03/09/2013   HCT 34.5* 03/09/2013   MCV 92.5 03/09/2013   PLT 231 03/09/2013     Chemistry      Component Value Date/Time   NA 138 02/16/2013 0838   NA 140 11/01/2011 1325   K 4.3 02/16/2013 0838   K 3.9 11/01/2011 1325   CL 109* 10/13/2012 0801   CL 104 11/01/2011 1325   CO2 22 02/16/2013 0838   CO2 28 11/01/2011 1325   BUN 12.2 02/16/2013 0838   BUN 9 11/01/2011 1325   CREATININE 0.6* 02/16/2013 0838   CREATININE 0.60 11/01/2011 1325       Component Value Date/Time   CALCIUM 8.5 02/16/2013 0838   CALCIUM 9.1 11/01/2011 1325   ALKPHOS 157* 02/16/2013 0838   ALKPHOS 101 11/01/2011 1325   AST 45* 02/16/2013 0838   AST 34 11/01/2011 1325   ALT 13 02/16/2013 0838   ALT 37 11/01/2011 1325   BILITOT 0.35 02/16/2013 0838   BILITOT 0.4 11/01/2011 1325          Impression and Plan:  This is a pleasant 67 year old gentleman with the following issues:   1. Prostate cancer. His diagnosis dates back to October of 2010. He had a prostate-specific antigen of 15, Gleason score of 5+ 4=9. He has developed castration-resistant disease. He received Taxotere without major complications but his PSA is  going up slowly from 27 to 44 in 3 months. He had a restaging CT scan and bone scan  showed widespread osseous metastatic disease which was progressive from prior scans. However, pelvic adenopathy was improved. Recommend that we hold chemotherapy for now therapy given the combined toxicity associated with both.  2. Bony disease. Continue Xgeva at [facility].   3. Androgen deprivation.  I will recommend continuing that for the time being. Last given in April 2014.  4. Back Pain. Doing better on OxyContin to 30 mg TID. Seems to be stable and he is undergoing radiation therapy as mentioned.  5. IV access: PAC is place.  6. Neutropenia prophylaxis: On Neulasta with each cycle of chemotherapy.  7. Nausea/vomiting. Continue Zofran. Much improved now.  8. Weight gain: Better since changing Prednisone to QOD.   9. Right upper extremity edema: Likely dependent edema improved now.   10. Shortens of breath with activity: improved today.   11. Follow-up: On 03/30/2013 for the resumption of chemotherapy at that time.    [facility] 03/09/2013

## 2013-03-10 ENCOUNTER — Ambulatory Visit: Payer: Medicare Other

## 2013-03-10 ENCOUNTER — Ambulatory Visit
Admission: RE | Admit: 2013-03-10 | Discharge: 2013-03-10 | Disposition: A | Discharge status: Home / Self Care | Payer: Medicare Other | Source: Ambulatory Visit | Attending: Radiation Oncology | Admitting: Radiation Oncology

## 2013-03-11 ENCOUNTER — Ambulatory Visit
Admission: RE | Admit: 2013-03-11 | Discharge: 2013-03-11 | Disposition: A | Discharge status: Home / Self Care | Payer: Medicare Other | Source: Ambulatory Visit | Attending: Radiation Oncology | Admitting: Radiation Oncology

## 2013-03-12 ENCOUNTER — Ambulatory Visit
Admission: RE | Admit: 2013-03-12 | Discharge: 2013-03-12 | Disposition: A | Discharge status: Home / Self Care | Payer: Medicare Other | Source: Ambulatory Visit | Attending: Radiation Oncology | Admitting: Radiation Oncology

## 2013-03-12 DIAGNOSIS — C61 Malignant neoplasm of prostate: Secondary | ICD-10-CM

## 2013-03-12 NOTE — Progress Notes (Signed)
Department of Radiation Oncology  Phone:  [phone] Fax:        [phone]  Weekly Treatment Note    Name: Jeff Jimenez Date: 03/12/2013 MRN: [id number] DOB: 16-May-1945   Current dose: 25 Gy  Current fraction: 10   MEDICATIONS: Current Outpatient Prescriptions  Medication Sig Dispense Refill  . amLODipine (NORVASC) 10 MG tablet Take 1 tablet (10 mg total) by mouth every evening.  30 tablet  9  . atenolol (TENORMIN) 50 MG tablet Take 1.5 tablets (75 mg total) by mouth every evening.  30 tablet  9  . Calcium Carbonate-Vitamin D (CALCIUM 600+D) 600-400 MG-UNIT per tablet Take 2 tablets by mouth daily.       Marland Kitchen denosumab (XGEVA) 120 MG/1.7ML SOLN Inject 120 mg into the skin every 30 (thirty) days. Patient receives injection in drs office      . diphenhydrAMINE (SOMINEX) 25 MG tablet Take 25 mg by mouth 2 (two) times daily.      Marland Kitchen docusate sodium (COLACE) 100 MG capsule Take 300 mg by mouth daily.       Marland Kitchen leuprolide (LUPRON DEPOT) 22.5 MG injection Inject 22.5 mg into the muscle every 6 (six) months. Or as directed by urology  1 each  0  . lidocaine-prilocaine (EMLA) cream Apply topically as needed. Apply approximately 1/2 teaspoon to skin over port-a-cath. Do not rub in. Place clear plastic over cream. 1-1 and 1/2 hours prior to chemotherapy treatment.  30 g  2  . ondansetron (ZOFRAN) 8 MG tablet TAKE 1 TABLET BY MOUTH EVERY 8 HOURS AS NEEDED FOR NAUSEA  20 tablet  1  . OxyCODONE (OXYCONTIN) 10 mg T12A 12 hr tablet Take one tablet every 8 hours.  Total is 30 mg every 8 hours.  90 tablet  0  . OxyCODONE (OXYCONTIN) 20 mg T12A 12 hr tablet Take one tablet Every 8 hours.  90 tablet  0  . Oxycodone HCl 10 MG TABS Take 1 tablet (10 mg total) by mouth every 4 (four) hours as needed.  100 tablet  0  . predniSONE (DELTASONE) 5 MG tablet Take 1 tablet (5 mg total) by mouth every other day. 90 day supply  45 tablet  1  . predniSONE (DELTASONE) 5 MG tablet TAKE 1 TABLET (5 MG TOTAL) BY  MOUTH DAILY. OR AS DIRECTED BY PROVIDER  30 tablet  1  . prochlorperazine (COMPAZINE) 10 MG tablet Take 10 mg by mouth daily.      . Tamsulosin HCl (FLOMAX) 0.4 MG CAPS Take 0.4 mg by mouth daily.        No current facility-administered medications for this encounter.     ALLERGIES: Review of patient's allergies indicates no known allergies.   LABORATORY DATA:  Lab Results  Component Value Date   WBC 16.7* 03/09/2013   HGB 11.0* 03/09/2013   HCT 34.5* 03/09/2013   MCV 92.5 03/09/2013   PLT 231 03/09/2013   Lab Results  Component Value Date   NA 138 02/16/2013   K 4.3 02/16/2013   CL 109* 10/13/2012   CO2 22 02/16/2013   Lab Results  Component Value Date   ALT 13 02/16/2013   AST 45* 02/16/2013   ALKPHOS 157* 02/16/2013   BILITOT 0.35 02/16/2013     NARRATIVE: [name] was seen today for weekly treatment management. The chart was checked and the patient's films were reviewed. The patient is doing better today.  The patient's nausea is much improved. He is eating better.  He does complain of some fatigue.  PHYSICAL EXAMINATION:     alert, in no acute distress  ASSESSMENT: The patient is doing satisfactorily with treatment.  PLAN: We will continue with the patient's radiation treatment as planned.

## 2013-03-12 NOTE — Progress Notes (Signed)
Weekly rad txs pelvis 10/15 complwetd, bowels better, no nausea, eating better, :I force myself, better this week than last week eating""I;m just tired more" 6:58 PM

## 2013-03-14 ENCOUNTER — Ambulatory Visit
Admission: RE | Admit: 2013-03-14 | Discharge: 2013-03-14 | Disposition: A | Discharge status: Home / Self Care | Payer: Medicare Other | Source: Ambulatory Visit | Attending: Radiation Oncology | Admitting: Radiation Oncology

## 2013-03-15 ENCOUNTER — Ambulatory Visit
Admission: RE | Admit: 2013-03-15 | Discharge: 2013-03-15 | Disposition: A | Discharge status: Home / Self Care | Payer: Medicare Other | Source: Ambulatory Visit | Attending: Radiation Oncology | Admitting: Radiation Oncology

## 2013-03-16 ENCOUNTER — Ambulatory Visit
Admission: RE | Admit: 2013-03-16 | Discharge: 2013-03-16 | Disposition: A | Discharge status: Home / Self Care | Payer: Medicare Other | Source: Ambulatory Visit | Attending: Radiation Oncology | Admitting: Radiation Oncology

## 2013-03-17 ENCOUNTER — Ambulatory Visit
Admission: RE | Admit: 2013-03-17 | Discharge: 2013-03-17 | Disposition: A | Discharge status: Home / Self Care | Payer: Medicare Other | Source: Ambulatory Visit | Attending: Radiation Oncology | Admitting: Radiation Oncology

## 2013-03-22 ENCOUNTER — Ambulatory Visit
Admission: RE | Admit: 2013-03-22 | Discharge: 2013-03-22 | Disposition: A | Discharge status: Home / Self Care | Payer: Medicare Other | Source: Ambulatory Visit | Attending: Radiation Oncology | Admitting: Radiation Oncology

## 2013-03-22 ENCOUNTER — Ambulatory Visit: Payer: Medicare Other

## 2013-03-22 ENCOUNTER — Encounter: Payer: Self-pay | Admitting: Radiation Oncology

## 2013-03-22 VITALS — BP 104/73 | HR 132 | Temp 98.7°F | Ht 60.0 in | Wt 165.6 lb

## 2013-03-22 DIAGNOSIS — C61 Malignant neoplasm of prostate: Secondary | ICD-10-CM

## 2013-03-22 NOTE — Progress Notes (Addendum)
Jeff Jimenez has completed 15 fractions to his Pelvis.  He states he has  A new pain in his lumbar spine which he grades as an 11 on scale of 0-10.  He denies any numbness or pain in his lower extremtites.  He took an Oxycontin 30 mg tab at 4pm, but has not taken any short-acting pain Oxycodone today.  Encouraged to take his meds at this time and he complied.

## 2013-03-23 ENCOUNTER — Ambulatory Visit: Payer: Medicare Other

## 2013-03-23 NOTE — Progress Notes (Signed)
Department of Radiation Oncology  Phone:  [phone] Fax:        [phone]  Weekly Treatment Note    Name: Jeff Jimenez Date: 03/23/2013 MRN: [id number] DOB: April 01, 1946   Current dose: 37.5 Gy  Current fraction: 15   MEDICATIONS: Current Outpatient Prescriptions  Medication Sig Dispense Refill  . amLODipine (NORVASC) 10 MG tablet Take 1 tablet (10 mg total) by mouth every evening.  30 tablet  9  . atenolol (TENORMIN) 50 MG tablet Take 1.5 tablets (75 mg total) by mouth every evening.  30 tablet  9  . Calcium Carbonate-Vitamin D (CALCIUM 600+D) 600-400 MG-UNIT per tablet Take 2 tablets by mouth daily.       Marland Kitchen denosumab (XGEVA) 120 MG/1.7ML SOLN Inject 120 mg into the skin every 30 (thirty) days. Patient receives injection in drs office      . diphenhydrAMINE (SOMINEX) 25 MG tablet Take 25 mg by mouth 2 (two) times daily.      Marland Kitchen docusate sodium (COLACE) 100 MG capsule Take 300 mg by mouth daily.       Marland Kitchen leuprolide (LUPRON DEPOT) 22.5 MG injection Inject 22.5 mg into the muscle every 6 (six) months. Or as directed by urology  1 each  0  . lidocaine-prilocaine (EMLA) cream Apply topically as needed. Apply approximately 1/2 teaspoon to skin over port-a-cath. Do not rub in. Place clear plastic over cream. 1-1 and 1/2 hours prior to chemotherapy treatment.  30 g  2  . ondansetron (ZOFRAN) 8 MG tablet TAKE 1 TABLET BY MOUTH EVERY 8 HOURS AS NEEDED FOR NAUSEA  20 tablet  1  . OxyCODONE (OXYCONTIN) 10 mg T12A 12 hr tablet Take one tablet every 8 hours.  Total is 30 mg every 8 hours.  90 tablet  0  . OxyCODONE (OXYCONTIN) 20 mg T12A 12 hr tablet Take one tablet Every 8 hours.  90 tablet  0  . Oxycodone HCl 10 MG TABS Take 1 tablet (10 mg total) by mouth every 4 (four) hours as needed.  100 tablet  0  . predniSONE (DELTASONE) 5 MG tablet Take 1 tablet (5 mg total) by mouth every other day. 90 day supply  45 tablet  1  . predniSONE (DELTASONE) 5 MG tablet TAKE 1 TABLET (5 MG TOTAL) BY  MOUTH DAILY. OR AS DIRECTED BY PROVIDER  30 tablet  1  . prochlorperazine (COMPAZINE) 10 MG tablet Take 10 mg by mouth daily.      . Tamsulosin HCl (FLOMAX) 0.4 MG CAPS Take 0.4 mg by mouth daily.        No current facility-administered medications for this encounter.     ALLERGIES: Review of patient's allergies indicates no known allergies.   LABORATORY DATA:  Lab Results  Component Value Date   WBC 16.7* 03/09/2013   HGB 11.0* 03/09/2013   HCT 34.5* 03/09/2013   MCV 92.5 03/09/2013   PLT 231 03/09/2013   Lab Results  Component Value Date   NA 138 02/16/2013   K 4.3 02/16/2013   CL 109* 10/13/2012   CO2 22 02/16/2013   Lab Results  Component Value Date   ALT 13 02/16/2013   AST 45* 02/16/2013   ALKPHOS 157* 02/16/2013   BILITOT 0.35 02/16/2013     NARRATIVE: [name] was seen today for weekly treatment management. The chart was checked and the patient's films were reviewed. The patient completed his final fraction today. He complains of significant increase pain in the low lumbar  spine at this time. He has been having some pain getting on and off the table and the treatment table also was quite hard which often causes some discomfort during the treatment as well. This may play into some increased pain and I would hope for some improvement with his treatment finished today and also some hopeful continued response. He has been taking his short acting pain medicine every 4 hours without relief.  PHYSICAL EXAMINATION: height is 5' (1.524 m) and weight is 165 lb 9.6 oz (75.116 kg). His temperature is 98.7 F (37.1 C). His blood pressure is 104/73 and his pulse is 132.        ASSESSMENT: The patient did satisfactorily with treatment.  PLAN:  the patient will return to our clinic for followup in one month. He is complaining of severe pain today and we discussed increasing his long acting pain medicine to 60 mg OxyContin twice a day. This is up from 30 mg 3 times a day. He will  continue using his short acting pain medicine as needed and he may need a further increased based on his severe pain today.

## 2013-03-24 ENCOUNTER — Telehealth: Payer: Self-pay

## 2013-03-24 NOTE — Telephone Encounter (Signed)
Patient called for refill on oxycontin, called him back to inform that his 30 day supply is not for refill until 04/08/13 so to call back by 04/05/13 for prescription.He understands and will call back.Initial script written by Dr.Shadad.

## 2013-03-27 ENCOUNTER — Other Ambulatory Visit: Payer: Self-pay | Admitting: Oncology

## 2013-03-30 ENCOUNTER — Other Ambulatory Visit (HOSPITAL_BASED_OUTPATIENT_CLINIC_OR_DEPARTMENT_OTHER): Payer: Medicare Other | Admitting: Lab

## 2013-03-30 ENCOUNTER — Ambulatory Visit (HOSPITAL_BASED_OUTPATIENT_CLINIC_OR_DEPARTMENT_OTHER): Payer: Medicare Other

## 2013-03-30 ENCOUNTER — Telehealth: Payer: Self-pay | Admitting: Oncology

## 2013-03-30 ENCOUNTER — Ambulatory Visit (HOSPITAL_BASED_OUTPATIENT_CLINIC_OR_DEPARTMENT_OTHER): Payer: Medicare Other | Admitting: Oncology

## 2013-03-30 VITALS — BP 113/70 | HR 93 | Temp 98.1°F | Resp 20 | Ht 60.0 in | Wt 163.1 lb

## 2013-03-30 DIAGNOSIS — M899 Disorder of bone, unspecified: Secondary | ICD-10-CM

## 2013-03-30 DIAGNOSIS — M549 Dorsalgia, unspecified: Secondary | ICD-10-CM

## 2013-03-30 DIAGNOSIS — C61 Malignant neoplasm of prostate: Secondary | ICD-10-CM

## 2013-03-30 DIAGNOSIS — R634 Abnormal weight loss: Secondary | ICD-10-CM

## 2013-03-30 DIAGNOSIS — C7951 Secondary malignant neoplasm of bone: Secondary | ICD-10-CM

## 2013-03-30 DIAGNOSIS — Z5111 Encounter for antineoplastic chemotherapy: Secondary | ICD-10-CM

## 2013-03-30 DIAGNOSIS — R609 Edema, unspecified: Secondary | ICD-10-CM

## 2013-03-30 LAB — CBC WITH DIFFERENTIAL/PLATELET
EOS%: 1.7 % (ref 0.0–7.0)
Eosinophils Absolute: 0.1 10*3/uL (ref 0.0–0.5)
HCT: 34.4 % — ABNORMAL LOW (ref 38.4–49.9)
HGB: 11 g/dL — ABNORMAL LOW (ref 13.0–17.1)
MCH: 28.5 pg (ref 27.2–33.4)
MCV: 89.1 fL (ref 79.3–98.0)
MONO#: 0.6 10*3/uL (ref 0.1–0.9)
MONO%: 9.2 % (ref 0.0–14.0)
NEUT#: 4.9 10*3/uL (ref 1.5–6.5)
RBC: 3.86 10*6/uL — ABNORMAL LOW (ref 4.20–5.82)
RDW: 16.5 % — ABNORMAL HIGH (ref 11.0–14.6)
lymph#: 1 10*3/uL (ref 0.9–3.3)

## 2013-03-30 LAB — COMPREHENSIVE METABOLIC PANEL (CC13)
AST: 58 U/L — ABNORMAL HIGH (ref 5–34)
Albumin: 2.2 g/dL — ABNORMAL LOW (ref 3.5–5.0)
Alkaline Phosphatase: 366 U/L — ABNORMAL HIGH (ref 40–150)
Calcium: 8.2 mg/dL — ABNORMAL LOW (ref 8.4–10.4)
Chloride: 103 mEq/L (ref 98–109)
Potassium: 4.9 mEq/L (ref 3.5–5.1)
Sodium: 131 mEq/L — ABNORMAL LOW (ref 136–145)
Total Bilirubin: 0.39 mg/dL (ref 0.20–1.20)
Total Protein: 5.7 g/dL — ABNORMAL LOW (ref 6.4–8.3)

## 2013-03-30 MED ORDER — DIPHENHYDRAMINE HCL 50 MG/ML IJ SOLN
INTRAMUSCULAR | Status: AC
  Filled 2013-03-30: qty 1

## 2013-03-30 MED ORDER — OXYCODONE HCL 10 MG PO TABS: 10.0000 mg | ORAL_TABLET | ORAL | Status: DC | PRN

## 2013-03-30 MED ORDER — DEXAMETHASONE SODIUM PHOSPHATE 20 MG/5ML IJ SOLN
INTRAMUSCULAR | Status: AC
  Filled 2013-03-30: qty 5

## 2013-03-30 MED ORDER — FAMOTIDINE IN NACL 20-0.9 MG/50ML-% IV SOLN
20.0000 mg | Freq: Once | INTRAVENOUS | Status: AC
  Administered 2013-03-30: 20 mg via INTRAVENOUS

## 2013-03-30 MED ORDER — SODIUM CHLORIDE 0.9 % IJ SOLN
10.0000 mL | INTRAMUSCULAR | Status: DC | PRN
  Administered 2013-03-30: 10 mL
  Filled 2013-03-30: qty 10

## 2013-03-30 MED ORDER — FAMOTIDINE IN NACL 20-0.9 MG/50ML-% IV SOLN
INTRAVENOUS | Status: AC
  Filled 2013-03-30: qty 50

## 2013-03-30 MED ORDER — SODIUM CHLORIDE 0.9 % IV SOLN
25.0000 mg/m2 | Freq: Once | INTRAVENOUS | Status: AC
  Administered 2013-03-30: 50 mg via INTRAVENOUS
  Filled 2013-03-30: qty 5

## 2013-03-30 MED ORDER — ONDANSETRON 8 MG/NS 50 ML IVPB
INTRAVENOUS | Status: AC
  Filled 2013-03-30: qty 8

## 2013-03-30 MED ORDER — SODIUM CHLORIDE 0.9 % IV SOLN
Freq: Once | INTRAVENOUS | Status: AC
  Administered 2013-03-30: 12:00:00 via INTRAVENOUS

## 2013-03-30 MED ORDER — DEXAMETHASONE SODIUM PHOSPHATE 20 MG/5ML IJ SOLN
12.0000 mg | Freq: Once | INTRAMUSCULAR | Status: AC
  Administered 2013-03-30: 12 mg via INTRAVENOUS

## 2013-03-30 MED ORDER — ONDANSETRON 8 MG/50ML IVPB (CHCC)
8.0000 mg | Freq: Once | INTRAVENOUS | Status: AC
  Administered 2013-03-30: 8 mg via INTRAVENOUS

## 2013-03-30 MED ORDER — OXYCODONE HCL ER 60 MG PO T12A: EXTENDED_RELEASE_TABLET | ORAL | Status: DC

## 2013-03-30 MED ORDER — HEPARIN SOD (PORK) LOCK FLUSH 100 UNIT/ML IV SOLN
500.0000 [IU] | Freq: Once | INTRAVENOUS | Status: AC | PRN
  Administered 2013-03-30: 500 [IU]
  Filled 2013-03-30: qty 5

## 2013-03-30 MED ORDER — DIPHENHYDRAMINE HCL 50 MG/ML IJ SOLN
25.0000 mg | Freq: Once | INTRAMUSCULAR | Status: AC
  Administered 2013-03-30: 25 mg via INTRAVENOUS

## 2013-03-30 NOTE — Telephone Encounter (Signed)
gv and printed appt sched and avs for pt for DEC...sed added tx...sed will add lab tomorrow

## 2013-03-30 NOTE — Progress Notes (Signed)
Hematology and Oncology Follow Up Visit  Jeff Jimenez [id number] 1946-03-04 67 y.o. 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  Gleason score 5+4=9 in 5 out of the 8 cores, PSA 15.  Prior Therapy: He was started on Lupron and Casodex for his prostate cancer. His PSA nadir down to 0.02 back in March of 2012. In July of 2012, his PSA was up to 0.6. Casodex was stopped temporarily up until November of 2012. His PSA went up to 0.09. Casodex was restarted in November of 2012. In March of 2013, his PSA was 0.27 and most recently, his PSA was up to 0.76 with a castrate level of testosterone of 17.7. He received Zytiga 01/15/12 through 04/21/12 and this was stopped due to a rising PSA. Taxotere chemotherapy with every other day prednisone started on 04/28/12. He is S/P 8 cycles completed in 09/2012. PSA started to rise again after a close to 50% response in his PSA (from 49 to 27). He is S/P palliative radiation therapy to the spine and pelvis completed in 03/2013.  Current therapy: Started Jevtana on 10/13/2012. He is here for cycle 8 today. Lupron and monthly Xgeva given at [facility]. He is currently receiving   Interim History: 67 year old presents today for a follow up visit. He received his first 7 cycles of Jevtana without complications. Chemotherapy was held temporarily while he is getting radiation therapy to the back and pelvis. He tolerated radiation therapy with increased fatigue but no other complications. Having more back pain pain at this time. His pain is predominantly in the lower part without any neurological deficits or symptoms. He is using Oxycontin and Oxycodone for breakthrough pain. He is currently taking OxyContin at 60 mg twice a day with breakthrough oxycodone. No recent hospitalizations or illnesses. No neuropathy noted at this time. He reported that in the  middle of the day he feels increased pain and reportedly did better with 3 times a day OxyContin. His appetite has been relatively poor but not reported any nausea or vomiting. Has not reported any alteration of mental status psychiatric issues or depression. Unfortunately, his performance status has declined and he is spending more time in bed as well as sitting in a chair.   Medications: I have reviewed the patient's current medications. Current Outpatient Prescriptions  Medication Sig Dispense Refill  . amLODipine (NORVASC) 10 MG tablet Take 1 tablet (10 mg total) by mouth every evening.  30 tablet  9  . atenolol (TENORMIN) 50 MG tablet Take 1.5 tablets (75 mg total) by mouth every evening.  30 tablet  9  . Calcium Carbonate-Vitamin D (CALCIUM 600+D) 600-400 MG-UNIT per tablet Take 2 tablets by mouth daily.       Marland Kitchen denosumab (XGEVA) 120 MG/1.7ML SOLN Inject 120 mg into the skin every 30 (thirty) days. Patient receives injection in drs office      . diphenhydrAMINE (SOMINEX) 25 MG tablet Take 25 mg by mouth 2 (two) times daily.      Marland Kitchen docusate sodium (COLACE) 100 MG capsule Take 300 mg by mouth daily.       Marland Kitchen leuprolide (LUPRON DEPOT) 22.5 MG injection Inject 22.5 mg into the muscle every 6 (six) months. Or as directed by urology  1 each  0  . lidocaine-prilocaine (EMLA) cream Apply topically as needed. Apply approximately 1/2 teaspoon to skin over port-a-cath. Do not rub in.  Place clear plastic over cream. 1-1 and 1/2 hours prior to chemotherapy treatment.  30 g  2  . ondansetron (ZOFRAN) 8 MG tablet TAKE 1 TABLET BY MOUTH EVERY 8 HOURS AS NEEDED FOR NAUSEA  20 tablet  1  . Oxycodone HCl 10 MG TABS Take 1 tablet (10 mg total) by mouth every 4 (four) hours as needed.  100 tablet  0  . OxyCODONE HCl ER 60 MG T12A Take one tablet three times a day.  90 each  0  . predniSONE (DELTASONE) 5 MG tablet Take 1 tablet (5 mg total) by mouth every other day. 90 day supply  45 tablet  1  . predniSONE  (DELTASONE) 5 MG tablet TAKE 1 TABLET (5 MG TOTAL) BY MOUTH DAILY. OR AS DIRECTED BY PROVIDER  30 tablet  1  . prochlorperazine (COMPAZINE) 10 MG tablet Take 10 mg by mouth daily.      . Tamsulosin HCl (FLOMAX) 0.4 MG CAPS Take 0.4 mg by mouth daily.        No current facility-administered medications for this visit.    Allergies: No Known Allergies  Past Medical History, Surgical history, Social history, and Family History were reviewed and updated.  Review of Systems:  Remaining ROS negative.  Physical Exam: Blood pressure 113/70, pulse 93, temperature 98.1 F (36.7 C), temperature source Oral, resp. rate 20, height 5' (1.524 m), weight 163 lb 1.6 oz (73.982 kg). ECOG: 1 General appearance: alert Head: Normocephalic, without obvious abnormality, atraumatic Neck: no adenopathy, no carotid bruit, no JVD, supple, symmetrical, trachea midline and thyroid not enlarged, symmetric, no tenderness/mass/nodules Lymph nodes: Cervical, supraclavicular, and axillary nodes normal. Heart:regular rate and rhythm, S1, S2 normal, no murmur, click, rub or gallop Lung:chest clear, no wheezing, rales, normal symmetric air entry Abdomen: soft, non-tender, without masses or organomegaly EXT:no erythema, induration, or nodules.  Neurological: No motor or sensory deficits noted on today's exam.  Lab Results: Lab Results  Component Value Date   WBC 6.6 03/30/2013   HGB 11.0* 03/30/2013   HCT 34.4* 03/30/2013   MCV 89.1 03/30/2013   PLT 347 03/30/2013     Chemistry      Component Value Date/Time   NA 138 02/16/2013 0838   NA 140 11/01/2011 1325   K 4.3 02/16/2013 0838   K 3.9 11/01/2011 1325   CL 109* 10/13/2012 0801   CL 104 11/01/2011 1325   CO2 22 02/16/2013 0838   CO2 28 11/01/2011 1325   BUN 12.2 02/16/2013 0838   BUN 9 11/01/2011 1325   CREATININE 0.6* 02/16/2013 0838   CREATININE 0.60 11/01/2011 1325      Component Value Date/Time   CALCIUM 8.5 02/16/2013 0838   CALCIUM 9.1 11/01/2011 1325    ALKPHOS 157* 02/16/2013 0838   ALKPHOS 101 11/01/2011 1325   AST 45* 02/16/2013 0838   AST 34 11/01/2011 1325   ALT 13 02/16/2013 0838   ALT 37 11/01/2011 1325   BILITOT 0.35 02/16/2013 0838   BILITOT 0.4 11/01/2011 1325          Impression and Plan:  This is a pleasant 67 year old gentleman with the following issues:   1. Prostate cancer. His diagnosis dates back to October of 2010. He had a prostate-specific antigen of 15, Gleason score of 5+ 4=9. He has developed castration-resistant disease. The plan is to proceed with cycle 8 of Jevtana chemotherapy and repeat in 3 weeks.  2. Bony disease. Continue Xgeva at [facility].   3. Androgen deprivation.  I will recommend continuing that for the time being. Last given in April 2014.  4. Back Pain. I have increased his OxyContin to 60 mg every 8 hours. He is to continue the oxycodone for breakthrough.  5. IV access: PAC is place.  6. Neutropenia prophylaxis: On Neulasta with each cycle of chemotherapy.  7. Nausea/vomiting. Continue Zofran. Much improved now.  8. Weight loss: I've counseled them about increasing her food supplements with Boost or Ensure.  9. Right upper extremity edema: Likely dependent edema improved now.   10. Shortens of breath with activity: improved today.   11. Follow-up: On 04/20/2013.    [facility] 03/30/2013

## 2013-03-30 NOTE — Patient Instructions (Signed)
[facility] Discharge Instructions for Patients Receiving Chemotherapy  Today you received the following chemotherapy agents Jevtana.  To help prevent nausea and vomiting after your treatment, we encourage you to take your nausea medication Zofran 8mg  and compazine 10 mg as ordered by Dr. Clelia Croft.   If you develop nausea and vomiting that is not controlled by your nausea medication, call the clinic.   BELOW ARE SYMPTOMS THAT SHOULD BE REPORTED IMMEDIATELY:  *FEVER GREATER THAN 100.5 F  *CHILLS WITH OR WITHOUT FEVER  NAUSEA AND VOMITING THAT IS NOT CONTROLLED WITH YOUR NAUSEA MEDICATION  *UNUSUAL SHORTNESS OF BREATH  *UNUSUAL BRUISING OR BLEEDING  TENDERNESS IN MOUTH AND THROAT WITH OR WITHOUT PRESENCE OF ULCERS  *URINARY PROBLEMS  *BOWEL PROBLEMS  UNUSUAL RASH Items with * indicate a potential emergency and should be followed up as soon as possible.  Feel free to call the clinic you have any questions or concerns. The clinic phone number is [phone].

## 2013-03-30 NOTE — Progress Notes (Signed)
Discharged at 1400, ambulatory with wife in no distress.

## 2013-03-31 ENCOUNTER — Ambulatory Visit (HOSPITAL_BASED_OUTPATIENT_CLINIC_OR_DEPARTMENT_OTHER): Payer: Medicare Other

## 2013-03-31 VITALS — BP 120/60 | HR 77 | Temp 97.4°F

## 2013-03-31 DIAGNOSIS — Z5189 Encounter for other specified aftercare: Secondary | ICD-10-CM

## 2013-03-31 DIAGNOSIS — C61 Malignant neoplasm of prostate: Secondary | ICD-10-CM

## 2013-03-31 DIAGNOSIS — C7951 Secondary malignant neoplasm of bone: Secondary | ICD-10-CM

## 2013-03-31 MED ORDER — PEGFILGRASTIM INJECTION 6 MG/0.6ML
6.0000 mg | Freq: Once | SUBCUTANEOUS | Status: AC
  Administered 2013-03-31: 6 mg via SUBCUTANEOUS
  Filled 2013-03-31: qty 0.6

## 2013-04-02 NOTE — Progress Notes (Signed)
  Radiation Oncology         (336) [phone] ________________________________  Name: [name] MRN: [id number]  Date: 03/22/2013  DOB: Jan 05, 1946  End of Treatment Note  Diagnosis:   Metastatic prostate cancer     Indication for treatment:  Palliative       Radiation treatment dates:   03/01/2013 through 03/22/2013  Site/dose:   The patient was treated to the lumbar spine using a 3 field technique. The patient was treated 237.5 gray in 15 fractions.  Narrative: The patient tolerated radiation treatment relatively well.   The patient did not have substantial difficulty with acute toxicity. The patient did continue to experience some significant pain and is pain medicine was increased at the end of treatment.  Plan: The patient has completed radiation treatment. The patient will return to radiation oncology clinic for routine followup in one month. I advised the patient to call or return sooner if they have any questions or concerns related to their recovery or treatment. ________________________________  Radene Gunning, M.D., Ph.D.

## 2013-04-02 NOTE — Addendum Note (Signed)
Encounter addended by: Jonna Coup, MD on: 04/02/2013  3:31 PM<BR>     Documentation filed: Visit Diagnoses, Notes Section

## 2013-04-02 NOTE — Progress Notes (Signed)
  Radiation Oncology         (336) [phone] ________________________________  Name: Jeff Jimenez MRN: [id number]  Date: 02/24/2013  DOB: Nov 15, 1945  SIMULATION AND TREATMENT PLANNING NOTE  DIAGNOSIS:  Metastatic prostate cancer  NARRATIVE:  The patient was brought to the CT Simulation planning suite.  Identity was confirmed.  All relevant records and images related to the planned course of therapy were reviewed.   Written consent to proceed with treatment was confirmed which was freely given after reviewing the details related to the planned course of therapy had been reviewed with the patient.  Then, the patient was set-up in a stable reproducible  supine position for radiation therapy.  CT images were obtained.  Surface markings were placed.  A  customized VAC lock bag was constructed. This complex treatment device will be used to help with immobilization on a daily basis.  The CT images were loaded into the planning software.  Then the target and avoidance structures were contoured.  Treatment planning then occurred.  The radiation prescription was entered and confirmed.  A total of care complex treatment devices were fabricated which relate to the designed radiation treatment fields. Each of these customized fields/ complex treatment devices will be used on a daily basis during the radiation course. I have requested : Isodose Plan.   PLAN:  The patient will receive 37.5 Gy in 15 fractions.  ________________________________   Radene Gunning, MD, PhD

## 2013-04-05 ENCOUNTER — Telehealth: Payer: Self-pay | Admitting: Medical Oncology

## 2013-04-05 NOTE — Telephone Encounter (Signed)
Pt called stating he has had increasing pain and has been up "all night for the past couple of nights." Asking for increase to oxycodone. Patient currently taking 60mg  every 8 hours and Oxycodone 10 mg for breakthrough pain. Reviewed with MD, per MD, patient to take 120 mg (2 tablets) at night, if no help, patient to take am dose of 120 mg as well, again if no help, patient to take 120 mg at scheduled afternoon time. If pain continues patient to call office. Patient gave verbal understanding, no further questions at this time.

## 2013-04-20 ENCOUNTER — Telehealth: Payer: Self-pay | Admitting: Oncology

## 2013-04-20 ENCOUNTER — Ambulatory Visit (HOSPITAL_BASED_OUTPATIENT_CLINIC_OR_DEPARTMENT_OTHER): Payer: Medicare Other

## 2013-04-20 ENCOUNTER — Other Ambulatory Visit (HOSPITAL_BASED_OUTPATIENT_CLINIC_OR_DEPARTMENT_OTHER): Payer: Medicare Other

## 2013-04-20 ENCOUNTER — Encounter: Payer: Self-pay | Admitting: Oncology

## 2013-04-20 ENCOUNTER — Ambulatory Visit (HOSPITAL_BASED_OUTPATIENT_CLINIC_OR_DEPARTMENT_OTHER): Payer: Medicare Other | Admitting: Oncology

## 2013-04-20 VITALS — BP 93/65 | HR 82 | Temp 97.9°F | Resp 20 | Ht 60.0 in | Wt 152.2 lb

## 2013-04-20 DIAGNOSIS — Z5111 Encounter for antineoplastic chemotherapy: Secondary | ICD-10-CM

## 2013-04-20 DIAGNOSIS — C61 Malignant neoplasm of prostate: Secondary | ICD-10-CM

## 2013-04-20 DIAGNOSIS — C7951 Secondary malignant neoplasm of bone: Secondary | ICD-10-CM

## 2013-04-20 DIAGNOSIS — E46 Unspecified protein-calorie malnutrition: Secondary | ICD-10-CM

## 2013-04-20 DIAGNOSIS — E860 Dehydration: Secondary | ICD-10-CM

## 2013-04-20 DIAGNOSIS — M549 Dorsalgia, unspecified: Secondary | ICD-10-CM

## 2013-04-20 LAB — CBC WITH DIFFERENTIAL/PLATELET
BASO%: 0.3 % (ref 0.0–2.0)
EOS%: 0.1 % (ref 0.0–7.0)
HCT: 32.4 % — ABNORMAL LOW (ref 38.4–49.9)
HGB: 10.3 g/dL — ABNORMAL LOW (ref 13.0–17.1)
LYMPH%: 11.2 % — ABNORMAL LOW (ref 14.0–49.0)
MCH: 27.7 pg (ref 27.2–33.4)
MCHC: 31.8 g/dL — ABNORMAL LOW (ref 32.0–36.0)
MCV: 87.1 fL (ref 79.3–98.0)
MONO#: 0.8 10*3/uL (ref 0.1–0.9)
MONO%: 7.1 % (ref 0.0–14.0)
NEUT%: 81.3 % — ABNORMAL HIGH (ref 39.0–75.0)
Platelets: 315 10*3/uL (ref 140–400)
RBC: 3.72 10*6/uL — ABNORMAL LOW (ref 4.20–5.82)
WBC: 11.2 10*3/uL — ABNORMAL HIGH (ref 4.0–10.3)

## 2013-04-20 LAB — COMPREHENSIVE METABOLIC PANEL (CC13)
ALT: 15 U/L (ref 0–55)
AST: 33 U/L (ref 5–34)
Alkaline Phosphatase: 289 U/L — ABNORMAL HIGH (ref 40–150)
Anion Gap: 11 mEq/L (ref 3–11)
CO2: 19 mEq/L — ABNORMAL LOW (ref 22–29)
Chloride: 102 mEq/L (ref 98–109)
Creatinine: 0.6 mg/dL — ABNORMAL LOW (ref 0.7–1.3)
Total Bilirubin: 0.55 mg/dL (ref 0.20–1.20)

## 2013-04-20 MED ORDER — OXYCODONE HCL ER 60 MG PO T12A
EXTENDED_RELEASE_TABLET | ORAL | Status: DC
Start: 2013-04-20 — End: 2013-05-19

## 2013-04-20 MED ORDER — HEPARIN SOD (PORK) LOCK FLUSH 100 UNIT/ML IV SOLN
500.0000 [IU] | Freq: Once | INTRAVENOUS | Status: AC | PRN
  Administered 2013-04-20: 500 [IU]
  Filled 2013-04-20: qty 5

## 2013-04-20 MED ORDER — SODIUM CHLORIDE 0.9 % IJ SOLN
10.0000 mL | INTRAMUSCULAR | Status: DC | PRN
  Administered 2013-04-20: 10 mL
  Filled 2013-04-20: qty 10

## 2013-04-20 MED ORDER — DIPHENHYDRAMINE HCL 50 MG/ML IJ SOLN
25.0000 mg | Freq: Once | INTRAMUSCULAR | Status: AC
  Administered 2013-04-20: 12:00:00 via INTRAVENOUS

## 2013-04-20 MED ORDER — DEXAMETHASONE SODIUM PHOSPHATE 20 MG/5ML IJ SOLN
12.0000 mg | Freq: Once | INTRAMUSCULAR | Status: AC
  Administered 2013-04-20: 12:00:00 via INTRAVENOUS

## 2013-04-20 MED ORDER — FAMOTIDINE IN NACL 20-0.9 MG/50ML-% IV SOLN
INTRAVENOUS | Status: AC
  Filled 2013-04-20: qty 50

## 2013-04-20 MED ORDER — SODIUM CHLORIDE 0.9 % IV SOLN
25.0000 mg/m2 | Freq: Once | INTRAVENOUS | Status: AC
  Administered 2013-04-20: 43 mg via INTRAVENOUS
  Filled 2013-04-20: qty 4.3

## 2013-04-20 MED ORDER — FAMOTIDINE IN NACL 20-0.9 MG/50ML-% IV SOLN
20.0000 mg | Freq: Once | INTRAVENOUS | Status: AC
  Administered 2013-04-20: 20 mg via INTRAVENOUS

## 2013-04-20 MED ORDER — DIPHENHYDRAMINE HCL 50 MG/ML IJ SOLN
INTRAMUSCULAR | Status: AC
  Filled 2013-04-20: qty 1

## 2013-04-20 MED ORDER — DEXAMETHASONE SODIUM PHOSPHATE 20 MG/5ML IJ SOLN
INTRAMUSCULAR | Status: AC
  Filled 2013-04-20: qty 5

## 2013-04-20 MED ORDER — OXYCODONE HCL 10 MG PO TABS: 10.0000 mg | ORAL_TABLET | ORAL | Status: DC | PRN

## 2013-04-20 MED ORDER — ONDANSETRON 8 MG/50ML IVPB (CHCC)
8.0000 mg | Freq: Once | INTRAVENOUS | Status: AC
  Administered 2013-04-20: 8 mg via INTRAVENOUS

## 2013-04-20 MED ORDER — ONDANSETRON 8 MG/NS 50 ML IVPB
INTRAVENOUS | Status: AC
  Filled 2013-04-20: qty 8

## 2013-04-20 MED ORDER — SODIUM CHLORIDE 0.9 % IV SOLN
Freq: Once | INTRAVENOUS | Status: AC
  Administered 2013-04-20: 12:00:00 via INTRAVENOUS

## 2013-04-20 MED ORDER — MEGESTROL ACETATE 40 MG/ML PO SUSP: 800.0000 mg | Freq: Every day | ORAL | Status: DC

## 2013-04-20 NOTE — Progress Notes (Signed)
Hematology and Oncology Follow Up Visit  Jeff Jimenez [id number] January 17, 1946 67 y.o. 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  Gleason score 5+4=9 in 5 out of the 8 cores, PSA 15.  Prior Therapy: He was started on Lupron and Casodex for his prostate cancer. His PSA nadir down to 0.02 back in March of 2012. In July of 2012, his PSA was up to 0.6. Casodex was stopped temporarily up until November of 2012. His PSA went up to 0.09. Casodex was restarted in November of 2012. In March of 2013, his PSA was 0.27 and most recently, his PSA was up to 0.76 with a castrate level of testosterone of 17.7. He received Zytiga 01/15/12 through 04/21/12 and this was stopped due to a rising PSA. Taxotere chemotherapy with every other day prednisone started on 04/28/12. He is S/P 8 cycles completed in 09/2012. PSA started to rise again after a close to 50% response in his PSA (from 49 to 27). He is S/P palliative radiation therapy to the spine and pelvis completed in 03/2013.  Current therapy: Started Jevtana on 10/13/2012. He is here for cycle 9 today. Lupron and monthly Xgeva given at [facility] urology.  Interim History: 67 year old presents today for a follow up visit. He received his first 8 cycles of Jevtana without complications. Chemotherapy was held temporarily while he is getting radiation therapy to the back and pelvis. He tolerated radiation therapy with increased fatigue but no other complications. He has been having more back pain. His pain is predominantly in the lower part without any neurological deficits or symptoms. Oxycontin was increased to 60 mg in the am and mid-day and 120 mg at bedtime. He is using Oxycodone for breakthrough pain. Pain is better with the increase in Oxycontin. No recent hospitalizations or illnesses. No neuropathy noted at this time. His appetite has been relatively  poor and he has lost 11 lbs in 3 weeks. He has intermittent nausea/vomiting depending on the type of food that he eats. None today. Has not reported any alteration of mental status psychiatric issues or depression. Unfortunately, his performance status has declined and he is spending more time in bed as well as sitting in a chair.   Medications: I have reviewed the patient's current medications. Current Outpatient Prescriptions  Medication Sig Dispense Refill  . polyethylene glycol (MIRALAX / GLYCOLAX) packet Take 17 g by mouth daily.      Marland Kitchen amLODipine (NORVASC) 10 MG tablet Take 1 tablet (10 mg total) by mouth every evening.  30 tablet  9  . atenolol (TENORMIN) 50 MG tablet Take 1.5 tablets (75 mg total) by mouth every evening.  30 tablet  9  . Calcium Carbonate-Vitamin D (CALCIUM 600+D) 600-400 MG-UNIT per tablet Take 2 tablets by mouth daily.       Marland Kitchen denosumab (XGEVA) 120 MG/1.7ML SOLN Inject 120 mg into the skin every 30 (thirty) days. Patient receives injection in drs office      . diphenhydrAMINE (SOMINEX) 25 MG tablet Take 25 mg by mouth 2 (two) times daily.      Marland Kitchen leuprolide (LUPRON DEPOT) 22.5 MG injection Inject 22.5 mg into the muscle every 6 (six) months. Or as directed by urology  1 each  0  . lidocaine-prilocaine (EMLA) cream Apply topically as needed. Apply approximately 1/2 teaspoon to skin over port-a-cath. Do not rub in. Place clear plastic over cream.  1-1 and 1/2 hours prior to chemotherapy treatment.  30 g  2  . megestrol (MEGACE ORAL) 40 MG/ML suspension Take 20 mLs (800 mg total) by mouth daily.  480 mL  1  . ondansetron (ZOFRAN) 8 MG tablet TAKE 1 TABLET BY MOUTH EVERY 8 HOURS AS NEEDED FOR NAUSEA  20 tablet  1  . Oxycodone HCl 10 MG TABS Take 1 tablet (10 mg total) by mouth every 4 (four) hours as needed.  100 tablet  0  . OxyCODONE HCl ER 60 MG T12A Take one tablet in the am and mid-day. Take 2 tablets at bedtime.      . OxyCODONE HCl ER 60 MG T12A Take one tablet in the  morning and mid-day. Take 2 tablets (120 mg) at bedtime.  120 each  0  . predniSONE (DELTASONE) 5 MG tablet Take 1 tablet (5 mg total) by mouth every other day. 90 day supply  45 tablet  1  . prochlorperazine (COMPAZINE) 10 MG tablet Take 10 mg by mouth daily.      . Tamsulosin HCl (FLOMAX) 0.4 MG CAPS Take 0.4 mg by mouth daily.        No current facility-administered medications for this visit.    Allergies: No Known Allergies  Past Medical History, Surgical history, Social history, and Family History were reviewed and updated.  Review of Systems:  Remaining ROS negative.  Physical Exam: Blood pressure 93/65, pulse 82, temperature 97.9 F (36.6 C), temperature source Oral, resp. rate 20, height 5' (1.524 m), weight 152 lb 3.2 oz (69.037 kg). ECOG: 1 General appearance: alert Head: Normocephalic, without obvious abnormality, atraumatic Neck: no adenopathy, no carotid bruit, no JVD, supple, symmetrical, trachea midline and thyroid not enlarged, symmetric, no tenderness/mass/nodules Lymph nodes: Cervical, supraclavicular, and axillary nodes normal. Heart:regular rate and rhythm, S1, S2 normal, no murmur, click, rub or gallop Lung:chest clear, no wheezing, rales, normal symmetric air entry Abdomen: soft, non-tender, without masses or organomegaly EXT:no erythema, induration, or nodules.  Neurological: No motor or sensory deficits noted on today's exam.  Lab Results: Lab Results  Component Value Date   WBC 11.2* 04/20/2013   HGB 10.3* 04/20/2013   HCT 32.4* 04/20/2013   MCV 87.1 04/20/2013   PLT 315 04/20/2013     Chemistry      Component Value Date/Time   NA 131* 03/30/2013 0939   NA 140 11/01/2011 1325   K 4.9 03/30/2013 0939   K 3.9 11/01/2011 1325   CL 109* 10/13/2012 0801   CL 104 11/01/2011 1325   CO2 19* 03/30/2013 0939   CO2 28 11/01/2011 1325   BUN 9.5 03/30/2013 0939   BUN 9 11/01/2011 1325   CREATININE 0.6* 03/30/2013 0939   CREATININE 0.60 11/01/2011 1325       Component Value Date/Time   CALCIUM 8.2* 03/30/2013 0939   CALCIUM 9.1 11/01/2011 1325   ALKPHOS 366* 03/30/2013 0939   ALKPHOS 101 11/01/2011 1325   AST 58* 03/30/2013 0939   AST 34 11/01/2011 1325   ALT 35 03/30/2013 0939   ALT 37 11/01/2011 1325   BILITOT 0.39 03/30/2013 0939   BILITOT 0.4 11/01/2011 1325     Results for JEDIDIAH, DEMARTINI (MRN [id number]) as of 04/20/2013 10:16  Ref. Range 11/24/2012 08:32 01/05/2013 08:57 01/26/2013 09:29 02/16/2013 08:38 03/30/2013 09:39  PSA Latest Range: <=4.00 ng/mL 30.54 (H) 37.71 (H) 62.76 (H) 82.82 (H) 153.80 (H)     Impression and Plan:  This is a pleasant 67 year old gentleman with  the following issues:   1. Prostate cancer. His diagnosis dates back to October of 2010. He had a prostate-specific antigen of 15, Gleason score of 5+ 4=9. He has developed castration-resistant disease. The plan is to proceed with cycle 9 of Jevtana chemotherapy. PSA has been rising. May consider Xofigo in the near future.   2. Bony disease. Continue Xgeva at [facility].   3. Androgen deprivation.  I will recommend continuing that for the time being.   4. Back Pain. Continue Oxycontin 60 mg in the am and mid-day and 120 mg at bedtime. He will continue to use Oxycodone for breakthrough pain. I have refilled his prescriptions today.    5. IV access: PAC is place.  6. Neutropenia prophylaxis: On Neulasta with each cycle of chemotherapy.  7. Nausea/vomiting. Continue Zofran. Much improved now.  8. Protein calorie malnutrition: I have counseled them about increasing her food supplements with Boost or Ensure. Prescription for Megace given. I have referred him to the Dietician.   9. Right upper extremity edema: Likely dependent edema improved now.   10. Hypotension: He was instructed to D/C his Amlodipine.    11. Follow-up: On 05/19/13.    Clenton Pare 04/20/2013   Patient seen and examined and unfortunately he continues to decline. He is complaining of  continuous pain which has modest improvement with the current regimen. His appetite has been poor and he is slightly hypotensive. On my physical examination today, he did not have any focal neurological deficits or any abnormal examination findings. He appeared slightly dehydrated with dry mucous membranes. His laboratory data including his PSA was discussed today as well as the treatment plan. We plan on proceeding with systemic chemotherapy today but his PSA continued to rise I will ask Dr. Roselind Messier to discuss with him the role of Xofigo as a salvage therapy.  [facility] MD 04/20/2013

## 2013-04-20 NOTE — Patient Instructions (Signed)
[facility] Discharge Instructions for Patients Receiving Chemotherapy  Today you received the following chemotherapy agents Jevtana.   To help prevent nausea and vomiting after your treatment, we encourage you to take your nausea medication as directed.    If you develop nausea and vomiting that is not controlled by your nausea medication, call the clinic.   BELOW ARE SYMPTOMS THAT SHOULD BE REPORTED IMMEDIATELY:  *FEVER GREATER THAN 100.5 F  *CHILLS WITH OR WITHOUT FEVER  NAUSEA AND VOMITING THAT IS NOT CONTROLLED WITH YOUR NAUSEA MEDICATION  *UNUSUAL SHORTNESS OF BREATH  *UNUSUAL BRUISING OR BLEEDING  TENDERNESS IN MOUTH AND THROAT WITH OR WITHOUT PRESENCE OF ULCERS  *URINARY PROBLEMS  *BOWEL PROBLEMS  UNUSUAL RASH Items with * indicate a potential emergency and should be followed up as soon as possible.  Feel free to call the clinic you have any questions or concerns. The clinic phone number is [phone].    

## 2013-04-20 NOTE — Telephone Encounter (Signed)
[facility] PT APPT SCHEDULE FOR JANUARY.  °

## 2013-04-21 ENCOUNTER — Ambulatory Visit (HOSPITAL_BASED_OUTPATIENT_CLINIC_OR_DEPARTMENT_OTHER): Payer: Medicare Other

## 2013-04-21 ENCOUNTER — Encounter: Payer: Self-pay | Admitting: Radiation Oncology

## 2013-04-21 ENCOUNTER — Ambulatory Visit: Payer: Medicare Other | Admitting: Nutrition

## 2013-04-21 VITALS — BP 96/59 | HR 72 | Temp 97.9°F

## 2013-04-21 DIAGNOSIS — C7951 Secondary malignant neoplasm of bone: Secondary | ICD-10-CM

## 2013-04-21 DIAGNOSIS — C61 Malignant neoplasm of prostate: Secondary | ICD-10-CM

## 2013-04-21 MED ORDER — PEGFILGRASTIM INJECTION 6 MG/0.6ML
6.0000 mg | Freq: Once | SUBCUTANEOUS | Status: AC
  Administered 2013-04-21: 6 mg via SUBCUTANEOUS
  Filled 2013-04-21: qty 0.6

## 2013-04-21 NOTE — Progress Notes (Signed)
Patient is a 67 year old male diagnosed with metastatic prostate cancer.  He is a patient of Dr. Clelia Croft.  Past medical history includes shingles, BPH, diverticulitis, anemia, anxiety, and hypertension.  Medications include Megace, MiraLax, calcium with vitamin D, Zofran, prednisone, and Compazine.  Labs include sodium 132 and albumin 2.2 on December 30.  Height: 5 feet 0 inches. Weight: 152.2 pounds. Usual body weight: 192 pounds August 2014. BMI: 29.72.  Patient reports poor appetite and occasional nausea.  Patient reports he believes he doesn't even consume 1000 calories a day.  He does tolerate foods like applesauce and yogurt.  Food intake is dependent on what patient may have taste for.  Patient states " I force myself to eat."  Patient reports constipation, relieved with MiraLax.  Nutrition diagnosis: Unintended weight loss related to diagnosis of metastatic prostate cancer and associated treatments as evidenced by no prior need for nutrition related information.  Patient meets criteria for severe malnutrition in the context of chronic illness secondary to 21% weight loss in 4 months and less than 75% of estimated energy intake for greater than one month.  Intervention: Patient educated to increase calories and protein in small amounts throughout the day.  Patient understands how to add calories to foods.  Patient educated to take nausea medications as prescribed to improve nausea and oral intake.  Patient educated that increases in pain and nausea medication can result in constipation.  I educated patient on adding an oral nutrition supplement twice a day to 3 times a day.  I have provided samples for patient to try and information on where purchase.  I provided him with fact sheets.  I've given him my contact information.  Questions were answered and teach back method was used.  Monitoring, evaluation, goals: Patient will tolerate increased calories and protein along with oral nutrition  supplements to maintain present weight.  Next visit: Patient will call me with questions or concerns or if he desires a followup appointment.

## 2013-04-23 ENCOUNTER — Telehealth: Payer: Self-pay | Admitting: *Deleted

## 2013-04-23 NOTE — Telephone Encounter (Signed)
Called patient with PSA results

## 2013-04-23 NOTE — Telephone Encounter (Signed)
Message copied by Randolm Idol on Fri Apr 23, 2013 12:25 PM ------      Message from: Wyatt Portela      Created: Wed Apr 21, 2013  8:27 AM       Please call his PSA. ------

## 2013-04-27 ENCOUNTER — Telehealth: Payer: Self-pay | Admitting: *Deleted

## 2013-04-27 NOTE — Telephone Encounter (Signed)
Patient calling to say he has been constipated for a week and finally had a BM and saw a small amount of bright red, blood. states he did strain. Instructed to keep a close eye on this if not resolved, call us back.  Also unsure how to take megace, per kristin curcio NP, 20 ml / day. Also he was worried that his PSA is climbing. Instructed patient to keep his appt with dr moody tomorrow ( 04/28/13 ) and with dr Alen Blew on 05/19/13

## 2013-04-28 ENCOUNTER — Encounter: Payer: Self-pay | Admitting: Radiation Oncology

## 2013-04-28 ENCOUNTER — Ambulatory Visit
Admission: RE | Admit: 2013-04-28 | Discharge: 2013-04-28 | Disposition: A | Discharge status: Home / Self Care | Payer: Medicare Other | Source: Ambulatory Visit | Attending: Radiation Oncology | Admitting: Radiation Oncology

## 2013-04-28 VITALS — BP 96/70 | HR 83 | Temp 98.8°F | Resp 20 | Wt 148.6 lb

## 2013-04-28 DIAGNOSIS — C61 Malignant neoplasm of prostate: Secondary | ICD-10-CM

## 2013-04-28 HISTORY — DX: Personal history of irradiation: Z92.3

## 2013-04-28 NOTE — Progress Notes (Addendum)
Follow up s/p rad txs, L-spine /metastatic prostate ca, in w/c, moon face, last pain med taken at 1100am today pain in low back now rates a 4 on 1-10 scale, pain med only lasts about 1 hour stated and then his pain returns, poor appetite, only had yogurt and water today , can eat soft foods stated, just hasn't , thre up ensure after drinking , sae Ernestene Kiel on 04/21/13 was given boost and ensure,  Takes nausea meds at times and still throws up after he takes it at times, weak and fatigued, states "No quality of life,I stay in bed all day" reads his kindle ,stated has had constipation for a week, had bm but had bright red blood also outside stool, said he called med onc yeterday and spoke wih Gina Dixon,RN  2:08 PM

## 2013-04-29 NOTE — Progress Notes (Signed)
Radiation Oncology         [phone] ________________________________  Name: NUMAN ZYLSTRA MRN: [id number]  Date: 04/28/2013  DOB: September 29, 1945  Follow-Up Visit Note  CC: [name], MD  Wyatt Portela, MD  Diagnosis:   Metastatic prostate cancer  Interval Since Last Radiation:  One month   Narrative:  The patient returns today for routine follow-up.  The patient completed palliative radiation treatment to the lumbar spine on 03/22/2013. The patient states that he really did not have significant improvement in his pain he feels from radiation treatment. The patient has increased his pain medicine and is having some improved pain control, but he feels that this is more likely related to medications. Overall, the patient's performance status has declined. He is spending most of his time in bed currently. He reads his candidal and this is his major activity. The patient really does not describe any diffuse or distant pain. Continued pain and what appeared to be the lumbar spine but again no significant improvement in his symptoms with treatment to this area.                              ALLERGIES:  has No Known Allergies.  Meds: Current Outpatient Prescriptions  Medication Sig Dispense Refill  . atenolol (TENORMIN) 50 MG tablet Take 1.5 tablets (75 mg total) by mouth every evening.  30 tablet  9  . Calcium Carbonate-Vitamin D (CALCIUM 600+D) 600-400 MG-UNIT per tablet Take 2 tablets by mouth daily.       Marland Kitchen denosumab (XGEVA) 120 MG/1.7ML SOLN Inject 120 mg into the skin every 30 (thirty) days. Patient receives injection in drs office      . leuprolide (LUPRON DEPOT) 22.5 MG injection Inject 22.5 mg into the muscle every 6 (six) months. Or as directed by urology  1 each  0  . lidocaine-prilocaine (EMLA) cream Apply topically as needed. Apply approximately 1/2 teaspoon to skin over port-a-cath. Do not rub in. Place clear plastic over cream. 1-1 and 1/2 hours prior to chemotherapy treatment.   30 g  2  . megestrol (MEGACE ORAL) 40 MG/ML suspension Take 20 mLs (800 mg total) by mouth daily.  480 mL  1  . ondansetron (ZOFRAN) 8 MG tablet TAKE 1 TABLET BY MOUTH EVERY 8 HOURS AS NEEDED FOR NAUSEA  20 tablet  1  . Oxycodone HCl 10 MG TABS Take 1 tablet (10 mg total) by mouth every 4 (four) hours as needed.  100 tablet  0  . OxyCODONE HCl ER 60 MG T12A Take one tablet in the am and mid-day. Take 2 tablets at bedtime.      . OxyCODONE HCl ER 60 MG T12A Take one tablet in the morning and mid-day. Take 2 tablets (120 mg) at bedtime.  120 each  0  . polyethylene glycol (MIRALAX / GLYCOLAX) packet Take 17 g by mouth daily.      . predniSONE (DELTASONE) 5 MG tablet Take 1 tablet (5 mg total) by mouth every other day. 90 day supply  45 tablet  1  . prochlorperazine (COMPAZINE) 10 MG tablet Take 10 mg by mouth daily.      . Tamsulosin HCl (FLOMAX) 0.4 MG CAPS Take 0.4 mg by mouth daily.       Marland Kitchen amLODipine (NORVASC) 10 MG tablet Take 1 tablet (10 mg total) by mouth every evening.  30 tablet  9  . diphenhydrAMINE (Felicity) 25  MG tablet Take 25 mg by mouth 2 (two) times daily.       No current facility-administered medications for this encounter.    Physical Findings: The patient is in no acute distress. Patient is alert and oriented.  weight is 148 lb 9.6 oz (67.405 kg). His oral temperature is 98.8 F (37.1 C). His blood pressure is 96/70 and his pulse is 83. His respiration is 20. Marland Kitchen   Sitting in a wheelchair, in no acute distress, appears fatigued. Cardiovascular: Regular rate and rhythm Respiratory: Clear to auscultation bilaterally GI: Soft, nontender, normal bowel sounds Extremities: No edema present   Lab Findings: Lab Results  Component Value Date   WBC 11.2* 04/20/2013   HGB 10.3* 04/20/2013   HCT 32.4* 04/20/2013   MCV 87.1 04/20/2013   PLT 315 04/20/2013     Radiographic Findings: No results found.  Impression:    The patient has a little bit better pain control  although this still has been an issue. He has increased his dosing in terms of his long acting pain medicine. He feels that this is working better but the patient states that his short acting pain medicine still wears off prior to his next dose. He has been taking this every 4 hours. I therefore recommended that he increase his short acting pain medicine to every 3 hours as needed. He is to let us know or medical oncology know if this still is insufficient.   Plan:  We will continue routine followup with the patient. He is seeing Dr. Alen Blew later this month to review systemic treatment options. I discussed with him once again the possibility of treating him with Xofigo. The patient really has not responded well overall to multiple treatments but this does remain an additional option to consider. Given the focal nature of his complaints of pain, external beam radiation treatment afforded the best option for pain relief but as noted above this did not transpire.  I would be happy to see the patient back sooner if he and Dr. Alen Blew wish for me to discuss further with the patient and to coordinate a trial of Xofigo.     Jodelle Gross, M.D., Ph.D.

## 2013-05-07 ENCOUNTER — Other Ambulatory Visit: Payer: Self-pay | Admitting: Oncology

## 2013-05-11 ENCOUNTER — Other Ambulatory Visit: Payer: Self-pay | Admitting: Oncology

## 2013-05-11 DIAGNOSIS — C61 Malignant neoplasm of prostate: Secondary | ICD-10-CM

## 2013-05-17 ENCOUNTER — Telehealth: Payer: Self-pay | Admitting: *Deleted

## 2013-05-17 NOTE — Telephone Encounter (Signed)
Patient calling to c/o increased pain x 1 week, that his pain meds are not helping. Per dr Alen Blew, increase oxycontin to 120 in the am. Keep regularly scheduled appt for 05/19/13

## 2013-05-19 ENCOUNTER — Other Ambulatory Visit (HOSPITAL_BASED_OUTPATIENT_CLINIC_OR_DEPARTMENT_OTHER): Payer: Medicare Other

## 2013-05-19 ENCOUNTER — Encounter (INDEPENDENT_AMBULATORY_CARE_PROVIDER_SITE_OTHER): Payer: Self-pay

## 2013-05-19 ENCOUNTER — Ambulatory Visit (HOSPITAL_BASED_OUTPATIENT_CLINIC_OR_DEPARTMENT_OTHER): Payer: Medicare Other | Admitting: Oncology

## 2013-05-19 ENCOUNTER — Encounter: Payer: Self-pay | Admitting: Oncology

## 2013-05-19 ENCOUNTER — Ambulatory Visit (HOSPITAL_BASED_OUTPATIENT_CLINIC_OR_DEPARTMENT_OTHER): Payer: Medicare Other

## 2013-05-19 ENCOUNTER — Ambulatory Visit: Payer: Medicare Other | Admitting: Nutrition

## 2013-05-19 ENCOUNTER — Telehealth: Payer: Self-pay | Admitting: Oncology

## 2013-05-19 VITALS — BP 108/55 | HR 80 | Temp 98.0°F | Resp 18 | Ht 60.0 in | Wt 143.8 lb

## 2013-05-19 DIAGNOSIS — C7952 Secondary malignant neoplasm of bone marrow: Secondary | ICD-10-CM

## 2013-05-19 DIAGNOSIS — C61 Malignant neoplasm of prostate: Secondary | ICD-10-CM

## 2013-05-19 DIAGNOSIS — E291 Testicular hypofunction: Secondary | ICD-10-CM

## 2013-05-19 DIAGNOSIS — M545 Low back pain, unspecified: Secondary | ICD-10-CM

## 2013-05-19 DIAGNOSIS — I959 Hypotension, unspecified: Secondary | ICD-10-CM

## 2013-05-19 DIAGNOSIS — R609 Edema, unspecified: Secondary | ICD-10-CM

## 2013-05-19 DIAGNOSIS — E46 Unspecified protein-calorie malnutrition: Secondary | ICD-10-CM

## 2013-05-19 DIAGNOSIS — C7951 Secondary malignant neoplasm of bone: Secondary | ICD-10-CM

## 2013-05-19 DIAGNOSIS — Z5111 Encounter for antineoplastic chemotherapy: Secondary | ICD-10-CM

## 2013-05-19 LAB — COMPREHENSIVE METABOLIC PANEL (CC13)
ALBUMIN: 2.5 g/dL — AB (ref 3.5–5.0)
ALT: 15 U/L (ref 0–55)
AST: 36 U/L — ABNORMAL HIGH (ref 5–34)
Alkaline Phosphatase: 304 U/L — ABNORMAL HIGH (ref 40–150)
Anion Gap: 9 mEq/L (ref 3–11)
BUN: 10.6 mg/dL (ref 7.0–26.0)
CHLORIDE: 103 meq/L (ref 98–109)
CO2: 21 meq/L — AB (ref 22–29)
Calcium: 8.1 mg/dL — ABNORMAL LOW (ref 8.4–10.4)
Creatinine: 0.6 mg/dL — ABNORMAL LOW (ref 0.7–1.3)
Glucose: 116 mg/dl (ref 70–140)
POTASSIUM: 4.2 meq/L (ref 3.5–5.1)
SODIUM: 133 meq/L — AB (ref 136–145)
TOTAL PROTEIN: 6 g/dL — AB (ref 6.4–8.3)
Total Bilirubin: 0.71 mg/dL (ref 0.20–1.20)

## 2013-05-19 LAB — CBC WITH DIFFERENTIAL/PLATELET
BASO%: 0.3 % (ref 0.0–2.0)
Basophils Absolute: 0 10*3/uL (ref 0.0–0.1)
EOS%: 0.8 % (ref 0.0–7.0)
Eosinophils Absolute: 0.1 10*3/uL (ref 0.0–0.5)
HCT: 30.5 % — ABNORMAL LOW (ref 38.4–49.9)
HGB: 9.6 g/dL — ABNORMAL LOW (ref 13.0–17.1)
LYMPH%: 17.2 % (ref 14.0–49.0)
MCH: 27.4 pg (ref 27.2–33.4)
MCHC: 31.5 g/dL — ABNORMAL LOW (ref 32.0–36.0)
MCV: 86.9 fL (ref 79.3–98.0)
MONO#: 0.9 10*3/uL (ref 0.1–0.9)
MONO%: 8 % (ref 0.0–14.0)
NEUT#: 8.2 10*3/uL — ABNORMAL HIGH (ref 1.5–6.5)
NEUT%: 73.7 % (ref 39.0–75.0)
PLATELETS: 316 10*3/uL (ref 140–400)
RBC: 3.51 10*6/uL — AB (ref 4.20–5.82)
RDW: 19.2 % — AB (ref 11.0–14.6)
WBC: 11.1 10*3/uL — AB (ref 4.0–10.3)
lymph#: 1.9 10*3/uL (ref 0.9–3.3)

## 2013-05-19 LAB — PSA: PSA: 219.2 ng/mL — ABNORMAL HIGH (ref <=4.00)

## 2013-05-19 MED ORDER — OXYCODONE HCL ER 60 MG PO T12A: EXTENDED_RELEASE_TABLET | ORAL | Status: DC

## 2013-05-19 MED ORDER — DIPHENHYDRAMINE HCL 50 MG/ML IJ SOLN
25.0000 mg | Freq: Once | INTRAMUSCULAR | Status: AC
  Administered 2013-05-19: 25 mg via INTRAVENOUS

## 2013-05-19 MED ORDER — FAMOTIDINE IN NACL 20-0.9 MG/50ML-% IV SOLN
20.0000 mg | Freq: Once | INTRAVENOUS | Status: AC
  Administered 2013-05-19: 20 mg via INTRAVENOUS

## 2013-05-19 MED ORDER — FAMOTIDINE IN NACL 20-0.9 MG/50ML-% IV SOLN
INTRAVENOUS | Status: AC
  Filled 2013-05-19: qty 50

## 2013-05-19 MED ORDER — DEXAMETHASONE SODIUM PHOSPHATE 20 MG/5ML IJ SOLN
INTRAMUSCULAR | Status: AC
  Filled 2013-05-19: qty 5

## 2013-05-19 MED ORDER — DIPHENHYDRAMINE HCL 50 MG/ML IJ SOLN
INTRAMUSCULAR | Status: AC
  Filled 2013-05-19: qty 1

## 2013-05-19 MED ORDER — HEPARIN SOD (PORK) LOCK FLUSH 100 UNIT/ML IV SOLN
500.0000 [IU] | Freq: Once | INTRAVENOUS | Status: AC | PRN
  Administered 2013-05-19: 500 [IU]
  Filled 2013-05-19: qty 5

## 2013-05-19 MED ORDER — ONDANSETRON 8 MG/50ML IVPB (CHCC)
8.0000 mg | Freq: Once | INTRAVENOUS | Status: AC
  Administered 2013-05-19: 8 mg via INTRAVENOUS

## 2013-05-19 MED ORDER — DEXAMETHASONE SODIUM PHOSPHATE 20 MG/5ML IJ SOLN
12.0000 mg | Freq: Once | INTRAMUSCULAR | Status: AC
  Administered 2013-05-19: 12 mg via INTRAVENOUS

## 2013-05-19 MED ORDER — SODIUM CHLORIDE 0.9 % IV SOLN
Freq: Once | INTRAVENOUS | Status: AC
  Administered 2013-05-19: 10:00:00 via INTRAVENOUS

## 2013-05-19 MED ORDER — SODIUM CHLORIDE 0.9 % IJ SOLN
10.0000 mL | INTRAMUSCULAR | Status: DC | PRN
  Administered 2013-05-19: 10 mL
  Filled 2013-05-19: qty 10

## 2013-05-19 MED ORDER — SODIUM CHLORIDE 0.9 % IV SOLN
25.0000 mg/m2 | Freq: Once | INTRAVENOUS | Status: AC
  Administered 2013-05-19: 43 mg via INTRAVENOUS
  Filled 2013-05-19: qty 4.3

## 2013-05-19 MED ORDER — ONDANSETRON 8 MG/NS 50 ML IVPB
INTRAVENOUS | Status: AC
  Filled 2013-05-19: qty 8

## 2013-05-19 MED ORDER — HYDROMORPHONE HCL 4 MG PO TABS: 4.0000 mg | ORAL_TABLET | ORAL | Status: DC | PRN

## 2013-05-19 NOTE — Patient Instructions (Signed)
[facility] Discharge Instructions for Patients Receiving Chemotherapy  Today you received the following chemotherapy agents Jevtana To help prevent nausea and vomiting after your treatment, we encourage you to take your nausea medication as prescribed.  If you develop nausea and vomiting that is not controlled by your nausea medication, call the clinic.   BELOW ARE SYMPTOMS THAT SHOULD BE REPORTED IMMEDIATELY:  *FEVER GREATER THAN 100.5 F  *CHILLS WITH OR WITHOUT FEVER  NAUSEA AND VOMITING THAT IS NOT CONTROLLED WITH YOUR NAUSEA MEDICATION  *UNUSUAL SHORTNESS OF BREATH  *UNUSUAL BRUISING OR BLEEDING  TENDERNESS IN MOUTH AND THROAT WITH OR WITHOUT PRESENCE OF ULCERS  *URINARY PROBLEMS  *BOWEL PROBLEMS  UNUSUAL RASH Items with * indicate a potential emergency and should be followed up as soon as possible.  Feel free to call the clinic you have any questions or concerns. The clinic phone number is [phone].    

## 2013-05-19 NOTE — Progress Notes (Signed)
Hematology and Oncology Follow Up Visit  Jeff Jimenez [id number] [birth year] 68 y.o. 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  Gleason score 5+4=9 in 5 out of the 8 cores, PSA 15.  Prior Therapy: He was started on Lupron and Casodex for his prostate cancer. His PSA nadir down to 0.02 back in March of 2012. In July of 2012, his PSA was up to 0.6. Casodex was stopped temporarily up until November of 2012. His PSA went up to 0.09. Casodex was restarted in November of 2012. In March of 2013, his PSA was 0.27 and most recently, his PSA was up to 0.76 with a castrate level of testosterone of 17.7. He received Zytiga 01/15/12 through 04/21/12 and this was stopped due to a rising PSA. Taxotere chemotherapy with every other day prednisone started on 04/28/12. He is S/P 8 cycles completed in 09/2012. PSA started to rise again after a close to 50% response in his PSA (from 49 to 27). He is S/P palliative radiation therapy to the spine and pelvis completed in 03/2013.  Current therapy: Started Jevtana on 10/13/2012. He is here for cycle 10 today. Lupron and monthly Xgeva given at [facility] urology.  Interim History: 68 year old presents today for a follow up visit. He received his first 9 cycles of Jevtana without complications. Chemotherapy was held temporarily while he is getting radiation therapy to the back and pelvis. He tolerated radiation therapy with increased fatigue but no other complications. He has been having more back pain. His pain is predominantly in the lower part without any neurological deficits or symptoms. Oxycontin was increased to 120 mg in the am  120 mg at bedtime. He is using Oxycodone for breakthrough pain. Pain is better with the increase in Oxycontin. No recent hospitalizations or illnesses. No neuropathy noted at this time. His appetite has been relatively poor. He has  intermittent nausea/vomiting depending on the type of food that he eats. None today. Has not reported any alteration of mental status psychiatric issues or depression. Unfortunately, his performance status has declined and he is spending more time in bed as well as sitting in a chair. He has not reported any fevers or chills or sweats. Has not reported any new neurological complaints.   Medications: I have reviewed the patient's current medications. Current Outpatient Prescriptions  Medication Sig Dispense Refill  . amLODipine (NORVASC) 10 MG tablet Take 1 tablet (10 mg total) by mouth every evening.  30 tablet  9  . atenolol (TENORMIN) 50 MG tablet Take 1.5 tablets (75 mg total) by mouth every evening.  30 tablet  9  . Calcium Carbonate-Vitamin D (CALCIUM 600+D) 600-400 MG-UNIT per tablet Take 2 tablets by mouth daily.       Marland Kitchen denosumab (XGEVA) 120 MG/1.7ML SOLN Inject 120 mg into the skin every 30 (thirty) days. Patient receives injection in drs office      . diphenhydrAMINE (SOMINEX) 25 MG tablet Take 25 mg by mouth 2 (two) times daily.      Marland Kitchen HYDROmorphone (DILAUDID) 4 MG tablet Take 1 tablet (4 mg total) by mouth every 4 (four) hours as needed for severe pain.  90 tablet  0  . leuprolide (LUPRON DEPOT) 22.5 MG injection Inject 22.5 mg into the muscle every 6 (six) months. Or as directed by urology  1 each  0  . lidocaine-prilocaine (EMLA) cream Apply topically as needed.  Apply approximately 1/2 teaspoon to skin over port-a-cath. Do not rub in. Place clear plastic over cream. 1-1 and 1/2 hours prior to chemotherapy treatment.  30 g  2  . megestrol (MEGACE ORAL) 40 MG/ML suspension Take 20 mLs (800 mg total) by mouth daily.  480 mL  1  . ondansetron (ZOFRAN) 8 MG tablet TAKE 1 TABLET BY MOUTH EVERY 8 HOURS AS NEEDED FOR NAUSEA  20 tablet  1  . OxyCODONE HCl ER 60 MG T12A Take two tablets three times a day.  180 each  0  . polyethylene glycol (MIRALAX / GLYCOLAX) packet Take 17 g by mouth daily.       . predniSONE (DELTASONE) 5 MG tablet Take 1 tablet (5 mg total) by mouth every other day. 90 day supply  45 tablet  1  . prochlorperazine (COMPAZINE) 10 MG tablet TAKE 1 TABLET (10 MG TOTAL) BY MOUTH EVERY 6 (SIX) HOURS AS NEEDED.  60 tablet  1  . Tamsulosin HCl (FLOMAX) 0.4 MG CAPS Take 0.4 mg by mouth daily.        No current facility-administered medications for this visit.    Allergies: No Known Allergies  Past Medical History, Surgical history, Social history, and Family History were reviewed and updated.  Review of Systems:  Remaining ROS negative.  Physical Exam: Blood pressure 108/55, pulse 80, temperature 98 F (36.7 C), temperature source Oral, resp. rate 18, height 5' (1.524 m), weight 143 lb 12.8 oz (65.227 kg), SpO2 98.00%. ECOG: 2 General appearance: alert Head: Normocephalic, without obvious abnormality, atraumatic Neck: no adenopathy, no carotid bruit, no JVD, supple, symmetrical, trachea midline and thyroid not enlarged, symmetric, no tenderness/mass/nodules Lymph nodes: Cervical, supraclavicular, and axillary nodes normal. Heart:regular rate and rhythm, S1, S2 normal, no murmur, click, rub or gallop Lung:chest clear, no wheezing, rales, normal symmetric air entry Abdomen: soft, non-tender, without masses or organomegaly EXT:no erythema, induration, or nodules.  Neurological: No motor or sensory deficits noted on today's exam.  Lab Results: Lab Results  Component Value Date   WBC 11.1* 05/19/2013   HGB 9.6* 05/19/2013   HCT 30.5* 05/19/2013   MCV 86.9 05/19/2013   PLT 316 05/19/2013     Chemistry      Component Value Date/Time   NA 132* 04/20/2013 0956   NA 140 11/01/2011 1325   K 4.6 04/20/2013 0956   K 3.9 11/01/2011 1325   CL 109* 10/13/2012 0801   CL 104 11/01/2011 1325   CO2 19* 04/20/2013 0956   CO2 28 11/01/2011 1325   BUN 9.2 04/20/2013 0956   BUN 9 11/01/2011 1325   CREATININE 0.6* 04/20/2013 0956   CREATININE 0.60 11/01/2011 1325      Component  Value Date/Time   CALCIUM 7.2* 04/20/2013 0956   CALCIUM 9.1 11/01/2011 1325   ALKPHOS 289* 04/20/2013 0956   ALKPHOS 101 11/01/2011 1325   AST 33 04/20/2013 0956   AST 34 11/01/2011 1325   ALT 15 04/20/2013 0956   ALT 37 11/01/2011 1325   BILITOT 0.55 04/20/2013 0956   BILITOT 0.4 11/01/2011 1325      Results for ROMULUS, CRAFTS (MRN [id number]) as of 05/19/2013 08:39  Ref. Range 03/30/2013 09:39 04/20/2013 09:57  PSA Latest Range: <=4.00 ng/mL 153.80 (H) 194.70 (H)     Impression and Plan:  This is a pleasant 68 year old gentleman with the following issues:   1. Prostate cancer. His diagnosis dates back to October of 2010. He had a prostate-specific antigen of 15, Gleason  score of 5+ 4=9. He has developed castration-resistant disease. The plan is to proceed with cycle 10 of Jevtana chemotherapy. PSA has been rising. May consider Xofigo in the near future. If his PSA continues to rise, then we will consider alternative options.  2. Bony disease. Continue Xgeva at [facility].   3. Androgen deprivation.  I will recommend continuing that for the time being.   4. Back Pain. I have increased his OxyContin to 120 mg 3 times a day and added Dilaudid for breakthrough medication.  5. IV access: PAC is place.  6. Neutropenia prophylaxis: On Neulasta with each cycle of chemotherapy.  7. Nausea/vomiting. Continue Zofran. Much improved now.  8. Protein calorie malnutrition: I have counseled them about increasing her food supplements with Boost or Ensure. Prescription for Megace given. I have referred him to the [facility].   9. Right upper extremity edema: Likely dependent edema improved now.   10. Hypotension: He was instructed to D/C his Amlodipine.    11. Follow-up: 06/09/2013

## 2013-05-19 NOTE — Telephone Encounter (Signed)
gv adn printed aptp scehd and avs for pt for Jan adn Feb...sed added tx.

## 2013-05-19 NOTE — Progress Notes (Signed)
Brief nutrition followup completed with patient.  Weight continues to decline and was documented as 143.8 pounds Wednesday, January 28, from 152.2 pounds December 30.  Patient states he did not tolerate Megace.  Patient reports vomiting after consumption.  Patient verbalizing desire to eat foods.  He did try oral nutrition supplements that were juice-based and tolerated well.  He continues to have constipation.  Nutrition diagnosis: Unintended weight loss continues.  Intervention: Patient educated to continue oral intake as desired but add two oral nutrition supplements daily to increase overall calories.  Provided patient with ordering and purchasing information.  Questions were answered to teach back method used.  Monitoring, evaluation, goals: Patient will tolerate increased calories and protein along with oral nutrition supplements twice a day promote weight maintenance.  Next visit: Wednesday, February 18, during chemotherapy.

## 2013-05-20 ENCOUNTER — Ambulatory Visit (HOSPITAL_BASED_OUTPATIENT_CLINIC_OR_DEPARTMENT_OTHER): Payer: Medicare Other

## 2013-05-20 VITALS — BP 101/63 | HR 72 | Temp 97.9°F

## 2013-05-20 DIAGNOSIS — C7951 Secondary malignant neoplasm of bone: Secondary | ICD-10-CM

## 2013-05-20 DIAGNOSIS — C61 Malignant neoplasm of prostate: Secondary | ICD-10-CM

## 2013-05-20 DIAGNOSIS — C7952 Secondary malignant neoplasm of bone marrow: Secondary | ICD-10-CM

## 2013-05-20 MED ORDER — PEGFILGRASTIM INJECTION 6 MG/0.6ML
6.0000 mg | Freq: Once | SUBCUTANEOUS | Status: AC
  Administered 2013-05-20: 6 mg via SUBCUTANEOUS
  Filled 2013-05-20: qty 0.6

## 2013-05-20 NOTE — Patient Instructions (Signed)

## 2013-05-26 ENCOUNTER — Encounter: Payer: Self-pay | Admitting: Oncology

## 2013-05-26 NOTE — Progress Notes (Signed)
Faxed pa form to [facility] for oxycontin 60mg .

## 2013-05-27 ENCOUNTER — Encounter: Payer: Self-pay | Admitting: Oncology

## 2013-05-27 NOTE — Progress Notes (Signed)
[facility], [id number], approved oxycontin er 60mg  from 05/27/13-04/21/14.

## 2013-06-03 ENCOUNTER — Telehealth: Payer: Self-pay | Admitting: Medical Oncology

## 2013-06-03 ENCOUNTER — Other Ambulatory Visit: Payer: Self-pay | Admitting: Medical Oncology

## 2013-06-03 DIAGNOSIS — E46 Unspecified protein-calorie malnutrition: Secondary | ICD-10-CM

## 2013-06-03 DIAGNOSIS — C61 Malignant neoplasm of prostate: Secondary | ICD-10-CM

## 2013-06-03 MED ORDER — HYDROMORPHONE HCL 4 MG PO TABS: 4.0000 mg | ORAL_TABLET | ORAL | Status: DC | PRN

## 2013-06-03 NOTE — Telephone Encounter (Signed)
Patient informed prescription ready for p.u. Denies any questions at this time.

## 2013-06-03 NOTE — Telephone Encounter (Signed)
Patient requesting refill of Dilaudid 4mg  tablet. States has been taking at least 5 tablets per day about every 4-6 hours, confirms that it has been controlling his pain well.   LOV 05/19/13  Next sched appt 06/09/2013 Lab/MD/tx/nutrition

## 2013-06-09 ENCOUNTER — Other Ambulatory Visit (HOSPITAL_BASED_OUTPATIENT_CLINIC_OR_DEPARTMENT_OTHER): Payer: Medicare Other

## 2013-06-09 ENCOUNTER — Ambulatory Visit (HOSPITAL_BASED_OUTPATIENT_CLINIC_OR_DEPARTMENT_OTHER): Payer: Medicare Other | Admitting: Oncology

## 2013-06-09 ENCOUNTER — Telehealth: Payer: Self-pay | Admitting: Oncology

## 2013-06-09 ENCOUNTER — Ambulatory Visit: Payer: Medicare Other | Admitting: Nutrition

## 2013-06-09 ENCOUNTER — Encounter (INDEPENDENT_AMBULATORY_CARE_PROVIDER_SITE_OTHER): Payer: Self-pay

## 2013-06-09 ENCOUNTER — Ambulatory Visit (HOSPITAL_BASED_OUTPATIENT_CLINIC_OR_DEPARTMENT_OTHER): Payer: Medicare Other

## 2013-06-09 ENCOUNTER — Encounter: Payer: Self-pay | Admitting: *Deleted

## 2013-06-09 ENCOUNTER — Encounter: Payer: Self-pay | Admitting: Oncology

## 2013-06-09 VITALS — BP 95/64 | HR 83 | Temp 98.2°F | Resp 18 | Ht 60.0 in | Wt 143.0 lb

## 2013-06-09 DIAGNOSIS — R112 Nausea with vomiting, unspecified: Secondary | ICD-10-CM

## 2013-06-09 DIAGNOSIS — C7951 Secondary malignant neoplasm of bone: Secondary | ICD-10-CM

## 2013-06-09 DIAGNOSIS — Z5111 Encounter for antineoplastic chemotherapy: Secondary | ICD-10-CM

## 2013-06-09 DIAGNOSIS — M549 Dorsalgia, unspecified: Secondary | ICD-10-CM

## 2013-06-09 DIAGNOSIS — C61 Malignant neoplasm of prostate: Secondary | ICD-10-CM

## 2013-06-09 DIAGNOSIS — E291 Testicular hypofunction: Secondary | ICD-10-CM

## 2013-06-09 DIAGNOSIS — E46 Unspecified protein-calorie malnutrition: Secondary | ICD-10-CM

## 2013-06-09 DIAGNOSIS — I959 Hypotension, unspecified: Secondary | ICD-10-CM

## 2013-06-09 DIAGNOSIS — R609 Edema, unspecified: Secondary | ICD-10-CM

## 2013-06-09 DIAGNOSIS — C7952 Secondary malignant neoplasm of bone marrow: Secondary | ICD-10-CM

## 2013-06-09 LAB — COMPREHENSIVE METABOLIC PANEL (CC13)
ALBUMIN: 2.5 g/dL — AB (ref 3.5–5.0)
ALK PHOS: 251 U/L — AB (ref 40–150)
ALT: 7 U/L (ref 0–55)
AST: 26 U/L (ref 5–34)
Anion Gap: 9 mEq/L (ref 3–11)
BUN: 8.4 mg/dL (ref 7.0–26.0)
CALCIUM: 8.2 mg/dL — AB (ref 8.4–10.4)
CHLORIDE: 104 meq/L (ref 98–109)
CO2: 21 mEq/L — ABNORMAL LOW (ref 22–29)
Creatinine: 0.5 mg/dL — ABNORMAL LOW (ref 0.7–1.3)
Glucose: 120 mg/dl (ref 70–140)
POTASSIUM: 4.2 meq/L (ref 3.5–5.1)
SODIUM: 134 meq/L — AB (ref 136–145)
TOTAL PROTEIN: 5.6 g/dL — AB (ref 6.4–8.3)
Total Bilirubin: 0.55 mg/dL (ref 0.20–1.20)

## 2013-06-09 LAB — CBC WITH DIFFERENTIAL/PLATELET
BASO%: 0.4 % (ref 0.0–2.0)
Basophils Absolute: 0.1 10*3/uL (ref 0.0–0.1)
EOS%: 0.2 % (ref 0.0–7.0)
Eosinophils Absolute: 0 10*3/uL (ref 0.0–0.5)
HCT: 27.7 % — ABNORMAL LOW (ref 38.4–49.9)
HGB: 8.6 g/dL — ABNORMAL LOW (ref 13.0–17.1)
LYMPH#: 1.4 10*3/uL (ref 0.9–3.3)
LYMPH%: 10.9 % — ABNORMAL LOW (ref 14.0–49.0)
MCH: 27.2 pg (ref 27.2–33.4)
MCHC: 31 g/dL — AB (ref 32.0–36.0)
MCV: 87.7 fL (ref 79.3–98.0)
MONO#: 0.9 10*3/uL (ref 0.1–0.9)
MONO%: 7 % (ref 0.0–14.0)
NEUT#: 10.7 10*3/uL — ABNORMAL HIGH (ref 1.5–6.5)
NEUT%: 81.5 % — ABNORMAL HIGH (ref 39.0–75.0)
NRBC: 1 % — AB (ref 0–0)
Platelets: 173 10*3/uL (ref 140–400)
RBC: 3.16 10*6/uL — ABNORMAL LOW (ref 4.20–5.82)
RDW: 20.5 % — AB (ref 11.0–14.6)
WBC: 13.1 10*3/uL — ABNORMAL HIGH (ref 4.0–10.3)

## 2013-06-09 LAB — PSA: PSA: 222.8 ng/mL — ABNORMAL HIGH (ref <=4.00)

## 2013-06-09 MED ORDER — HYDROMORPHONE HCL 4 MG PO TABS: 4.0000 mg | ORAL_TABLET | ORAL | Status: DC | PRN

## 2013-06-09 MED ORDER — SODIUM CHLORIDE 0.9 % IJ SOLN
10.0000 mL | INTRAMUSCULAR | Status: DC | PRN
  Administered 2013-06-09: 10 mL
  Filled 2013-06-09: qty 10

## 2013-06-09 MED ORDER — ONDANSETRON 8 MG/50ML IVPB (CHCC)
8.0000 mg | Freq: Once | INTRAVENOUS | Status: AC
  Administered 2013-06-09: 8 mg via INTRAVENOUS

## 2013-06-09 MED ORDER — ONDANSETRON 8 MG/NS 50 ML IVPB
INTRAVENOUS | Status: AC
  Filled 2013-06-09: qty 8

## 2013-06-09 MED ORDER — DEXAMETHASONE SODIUM PHOSPHATE 20 MG/5ML IJ SOLN
INTRAMUSCULAR | Status: AC
  Filled 2013-06-09: qty 5

## 2013-06-09 MED ORDER — HEPARIN SOD (PORK) LOCK FLUSH 100 UNIT/ML IV SOLN
500.0000 [IU] | Freq: Once | INTRAVENOUS | Status: AC | PRN
  Administered 2013-06-09: 500 [IU]
  Filled 2013-06-09: qty 5

## 2013-06-09 MED ORDER — DIPHENHYDRAMINE HCL 50 MG/ML IJ SOLN
25.0000 mg | Freq: Once | INTRAMUSCULAR | Status: AC
  Administered 2013-06-09: 25 mg via INTRAVENOUS

## 2013-06-09 MED ORDER — DIPHENHYDRAMINE HCL 50 MG/ML IJ SOLN
INTRAMUSCULAR | Status: AC
  Filled 2013-06-09: qty 1

## 2013-06-09 MED ORDER — FAMOTIDINE IN NACL 20-0.9 MG/50ML-% IV SOLN
20.0000 mg | Freq: Once | INTRAVENOUS | Status: AC
  Administered 2013-06-09: 20 mg via INTRAVENOUS

## 2013-06-09 MED ORDER — FAMOTIDINE IN NACL 20-0.9 MG/50ML-% IV SOLN
INTRAVENOUS | Status: AC
  Filled 2013-06-09: qty 50

## 2013-06-09 MED ORDER — SODIUM CHLORIDE 0.9 % IV SOLN
Freq: Once | INTRAVENOUS | Status: AC
  Administered 2013-06-09: 10:00:00 via INTRAVENOUS

## 2013-06-09 MED ORDER — OXYCODONE HCL ER 60 MG PO T12A: EXTENDED_RELEASE_TABLET | ORAL | Status: DC

## 2013-06-09 MED ORDER — CABAZITAXEL CHEMO INJECTION 60 MG/6ML W/DILUENT
43.0000 mg | Freq: Once | INTRAVENOUS | Status: AC
  Administered 2013-06-09: 43 mg via INTRAVENOUS
  Filled 2013-06-09: qty 4.3

## 2013-06-09 MED ORDER — DEXAMETHASONE SODIUM PHOSPHATE 20 MG/5ML IJ SOLN
12.0000 mg | Freq: Once | INTRAMUSCULAR | Status: AC
  Administered 2013-06-09: 12 mg via INTRAVENOUS

## 2013-06-09 NOTE — Patient Instructions (Signed)
[facility] Discharge Instructions for Patients Receiving Chemotherapy  Today you received the following chemotherapy agents Jevtana To help prevent nausea and vomiting after your treatment, we encourage you to take your nausea medication as prescribed.  If you develop nausea and vomiting that is not controlled by your nausea medication, call the clinic.   BELOW ARE SYMPTOMS THAT SHOULD BE REPORTED IMMEDIATELY:  *FEVER GREATER THAN 100.5 F  *CHILLS WITH OR WITHOUT FEVER  NAUSEA AND VOMITING THAT IS NOT CONTROLLED WITH YOUR NAUSEA MEDICATION  *UNUSUAL SHORTNESS OF BREATH  *UNUSUAL BRUISING OR BLEEDING  TENDERNESS IN MOUTH AND THROAT WITH OR WITHOUT PRESENCE OF ULCERS  *URINARY PROBLEMS  *BOWEL PROBLEMS  UNUSUAL RASH Items with * indicate a potential emergency and should be followed up as soon as possible.  Feel free to call the clinic you have any questions or concerns. The clinic phone number is [phone].    

## 2013-06-09 NOTE — Progress Notes (Signed)
Hematology and Oncology Follow Up Visit  Jeff Jimenez [id number] 08/29/1945 68 y.o. 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  Gleason score 5+4=9 in 5 out of the 8 cores, PSA 15.  Prior Therapy: He was started on Lupron and Casodex for his prostate cancer. His PSA nadir down to 0.02 back in March of 2012. In July of 2012, his PSA was up to 0.6. Casodex was stopped temporarily up until November of 2012. His PSA went up to 0.09. Casodex was restarted in November of 2012. In March of 2013, his PSA was 0.27 and most recently, his PSA was up to 0.76 with a castrate level of testosterone of 17.7. He received Zytiga 01/15/12 through 04/21/12 and this was stopped due to a rising PSA. Taxotere chemotherapy with every other day prednisone started on 04/28/12. He is S/P 8 cycles completed in 09/2012. PSA started to rise again after a close to 50% response in his PSA (from 49 to 27). He is S/P palliative radiation therapy to the spine and pelvis completed in 03/2013.  Current therapy: Started Jevtana on 10/13/2012. He is here for cycle 11 today. Lupron and monthly Xgeva given at [facility] urology.  Interim History: Jeff Jimenez presents today for a follow up visit. He received his last cycle of Jevtana without complications. Chemotherapy was held temporarily while he is getting radiation therapy to the back and pelvis. He is still having some back pain. His pain is predominantly in the lower part without any neurological deficits or symptoms. Oxycontin was increased to 120 mg three times a day. No recent hospitalizations or illnesses. No neuropathy noted at this time. His appetite has been relatively poor. He has intermittent nausea/vomiting depending on the type of food that he eats. None today. Has not reported any alteration of mental status psychiatric issues or depression. Unfortunately, his  performance status has declined and he is spending more time in bed as well as sitting in a chair. He has not reported any fevers or chills or sweats. Has not reported any new neurological complaints. He still have some mobility but getting slower every day.   Medications: I have reviewed the patient's current medications. Current Outpatient Prescriptions  Medication Sig Dispense Refill  . amLODipine (NORVASC) 10 MG tablet Take 1 tablet (10 mg total) by mouth every evening.  30 tablet  9  . atenolol (TENORMIN) 50 MG tablet Take 1.5 tablets (75 mg total) by mouth every evening.  30 tablet  9  . Calcium Carbonate-Vitamin D (CALCIUM 600+D) 600-400 MG-UNIT per tablet Take 2 tablets by mouth daily.       Marland Kitchen denosumab (XGEVA) 120 MG/1.7ML SOLN Inject 120 mg into the skin every 30 (thirty) days. Patient receives injection in drs office      . diphenhydrAMINE (SOMINEX) 25 MG tablet Take 25 mg by mouth 2 (two) times daily.      Marland Kitchen HYDROmorphone (DILAUDID) 4 MG tablet Take 1 tablet (4 mg total) by mouth every 4 (four) hours as needed for severe pain.  90 tablet  0  . leuprolide (LUPRON DEPOT) 22.5 MG injection Inject 22.5 mg into the muscle every 6 (six) months. Or as directed by urology  1 each  0  . lidocaine-prilocaine (EMLA) cream Apply topically as needed. Apply approximately 1/2 teaspoon to skin over port-a-cath. Do not rub in. Place clear plastic over cream. 1-1 and 1/2 hours prior  to chemotherapy treatment.  30 g  2  . megestrol (MEGACE ORAL) 40 MG/ML suspension Take 20 mLs (800 mg total) by mouth daily.  480 mL  1  . ondansetron (ZOFRAN) 8 MG tablet TAKE 1 TABLET BY MOUTH EVERY 8 HOURS AS NEEDED FOR NAUSEA  20 tablet  1  . OxyCODONE HCl ER 60 MG T12A Take two tablets three times a day.  180 each  0  . polyethylene glycol (MIRALAX / GLYCOLAX) packet Take 17 g by mouth daily.      . predniSONE (DELTASONE) 5 MG tablet Take 1 tablet (5 mg total) by mouth every other day. 90 day supply  45 tablet  1  .  prochlorperazine (COMPAZINE) 10 MG tablet TAKE 1 TABLET (10 MG TOTAL) BY MOUTH EVERY 6 (SIX) HOURS AS NEEDED.  60 tablet  1  . Tamsulosin HCl (FLOMAX) 0.4 MG CAPS Take 0.4 mg by mouth daily.        No current facility-administered medications for this visit.    Allergies: No Known Allergies  Past Medical History, Surgical history, Social history, and Family History were reviewed and updated.  Review of Systems:  Remaining ROS negative.  Physical Exam: Blood pressure 95/64, pulse 83, temperature 98.2 F (36.8 C), temperature source Oral, resp. rate 18, height 5' (1.524 m), weight 143 lb (64.864 kg). ECOG: 2 General appearance: alert Head: Normocephalic, without obvious abnormality, atraumatic Neck: no adenopathy, no carotid bruit, no JVD, supple, symmetrical, trachea midline and thyroid not enlarged, symmetric, no tenderness/mass/nodules Lymph nodes: Cervical, supraclavicular, and axillary nodes normal. Heart:regular rate and rhythm, S1, S2 normal, no murmur, click, rub or gallop Lung:chest clear, no wheezing, rales, normal symmetric air entry Abdomen: soft, non-tender, without masses or organomegaly EXT:no erythema, induration, or nodules.  Neurological: No motor or sensory deficits noted on today's exam.  Lab Results: Lab Results  Component Value Date   WBC 13.1* 06/09/2013   HGB 8.6* 06/09/2013   HCT 27.7* 06/09/2013   MCV 87.7 06/09/2013   PLT 173 06/09/2013     Chemistry      Component Value Date/Time   NA 133* 05/19/2013 0821   NA 140 11/01/2011 1325   K 4.2 05/19/2013 0821   K 3.9 11/01/2011 1325   CL 109* 10/13/2012 0801   CL 104 11/01/2011 1325   CO2 21* 05/19/2013 0821   CO2 28 11/01/2011 1325   BUN 10.6 05/19/2013 0821   BUN 9 11/01/2011 1325   CREATININE 0.6* 05/19/2013 0821   CREATININE 0.60 11/01/2011 1325      Component Value Date/Time   CALCIUM 8.1* 05/19/2013 0821   CALCIUM 9.1 11/01/2011 1325   ALKPHOS 304* 05/19/2013 0821   ALKPHOS 101 11/01/2011 1325   AST 36*  05/19/2013 0821   AST 34 11/01/2011 1325   ALT 15 05/19/2013 0821   ALT 37 11/01/2011 1325   BILITOT 0.71 05/19/2013 0821   BILITOT 0.4 11/01/2011 1325       Results for MELAKAI, CORELLA (MRN [id number]) as of 06/09/2013 09:07  Ref. Range 04/20/2013 09:57 05/19/2013 08:20  PSA Latest Range: <=4.00 ng/mL 194.70 (H) 219.20 (H)     Impression and Plan:  This is a pleasant 68 year old gentleman with the following issues:   1. Prostate cancer. His diagnosis dates back to October of 2010. He had a prostate-specific antigen of 15, Gleason score of 5+ 4=9. He has developed castration-resistant disease. The plan is to proceed with cycle 11 of Jevtana chemotherapy. I had a lengthy discussion  today with Jeff Jimenez and his wife regarding treatment options. I feel that his PSA is continuing to rise although he is getting a little clinical benefit from chemotherapy I favor change at this time. He has very little options at this point and I favor a trial of Xofigo and he is agreeable at this point. I will send him to see Dr. Lisbeth Renshaw from radiation oncology to discuss the specific treatment. He will receive his last chemotherapy dose today.   2. Bony disease. Continue Xgeva at [facility].   3. Androgen deprivation.  I will recommend continuing that for the time being.   4. Back Pain. I have increased his OxyContin to 120 mg 3 times a day and continued Dilaudid for breakthrough medication.  5. IV access: PAC is place.  6. Neutropenia prophylaxis: On Neulasta with each cycle of chemotherapy.  7. Nausea/vomiting. Continue Zofran. Much improved now.  8. Protein calorie malnutrition: I have counseled them about increasing her food supplements with Boost or Ensure.   9. Right upper extremity edema: Likely dependent edema improved now.   10. Hypotension: He was instructed to D/C his Amlodipine which helped somewhat his blood pressure today.  11. Follow-up: in one month.

## 2013-06-09 NOTE — Telephone Encounter (Signed)
gv adn printed appt sched and avs forpt for March...lm for Santiago Glad to sched pt with Dr. Lisbeth Renshaw she will contact pt

## 2013-06-09 NOTE — Progress Notes (Signed)
Chaplain made initial visit. Pt was present with his wife, Jeff Jimenez. She stated that he had been coming for five years and that this was their last chemo for a while, but that he would be beginning radiation soon. Wife stated that their strength for this long journey comes from God and that they have the prayers of many friends and church members. 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 She stated that she found it very satisfying and that she uses a lot of the same skills to care for her husband in his illness. Chaplain identified the ways that her former work had prepared her for her role as caregiver and she agreed. Pt's wife stated that she enjoys reading about "the end times," and knows "where she's going." She stated she wasn't ready to go yet because she still has kids and grandkids she wants to "bring to [facility]," but other than that, she is "ready to go home." Pt and wife seem to greatly value prayer so chaplain offered to say one. They asked that chaplain pray for continued strength. Chaplain practiced reflective listening and empathic presence. Pt and wife expressed thanks for visit.

## 2013-06-09 NOTE — Progress Notes (Signed)
Nutrition followup completed with patient in chemotherapy room.  Weight has been stable since last followup on January 28.  Patient's weight documented as 143 pounds.  He reports appetite comes and goes.  He had one episode of nausea with vomiting after he ate a chicken sandwich.  Labs were reviewed.  Noted patient has decreased performance status.  Patient continues to tolerate oral nutrition supplements that are juice-based.  Constipation has resolved.  Nutrition diagnosis: Unintended weight loss improved.  Intervention: Patient was educated to consume small, frequent meals and snacks throughout the day utilizing foods he knows are tolerated.  Patient encouraged to consume juice-based oral nutrition supplements.  Purchase information provided.  Teach back method used.  Monitoring, evaluation, goals: Patient will tolerate adequate calories and protein with oral nutrition supplements to promote weight maintenance.  Next visit: Will continue to follow patient as needed.  Followup not scheduled at this time.

## 2013-06-10 ENCOUNTER — Ambulatory Visit (HOSPITAL_BASED_OUTPATIENT_CLINIC_OR_DEPARTMENT_OTHER): Payer: Medicare Other

## 2013-06-10 VITALS — BP 98/62 | HR 79 | Temp 98.2°F

## 2013-06-10 DIAGNOSIS — C7951 Secondary malignant neoplasm of bone: Secondary | ICD-10-CM

## 2013-06-10 DIAGNOSIS — C7952 Secondary malignant neoplasm of bone marrow: Secondary | ICD-10-CM

## 2013-06-10 DIAGNOSIS — Z5189 Encounter for other specified aftercare: Secondary | ICD-10-CM

## 2013-06-10 DIAGNOSIS — C61 Malignant neoplasm of prostate: Secondary | ICD-10-CM

## 2013-06-10 MED ORDER — PEGFILGRASTIM INJECTION 6 MG/0.6ML
6.0000 mg | Freq: Once | SUBCUTANEOUS | Status: AC
  Administered 2013-06-10: 6 mg via SUBCUTANEOUS
  Filled 2013-06-10: qty 0.6

## 2013-06-17 NOTE — Progress Notes (Signed)
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

## 2013-06-20 ENCOUNTER — Inpatient Hospital Stay (HOSPITAL_COMMUNITY): Payer: Medicare Other

## 2013-06-20 ENCOUNTER — Inpatient Hospital Stay (HOSPITAL_COMMUNITY)
Admission: EM | Admit: 2013-06-20 | Discharge: 2013-06-23 | DRG: 378 | Disposition: A | Discharge status: Home / Self Care | Payer: Medicare Other | Attending: Internal Medicine | Admitting: Internal Medicine

## 2013-06-20 ENCOUNTER — Encounter (HOSPITAL_COMMUNITY): Payer: Self-pay | Admitting: Emergency Medicine

## 2013-06-20 DIAGNOSIS — D72829 Elevated white blood cell count, unspecified: Secondary | ICD-10-CM | POA: Diagnosis present

## 2013-06-20 DIAGNOSIS — D62 Acute posthemorrhagic anemia: Secondary | ICD-10-CM | POA: Diagnosis present

## 2013-06-20 DIAGNOSIS — C7952 Secondary malignant neoplasm of bone marrow: Secondary | ICD-10-CM

## 2013-06-20 DIAGNOSIS — M545 Low back pain, unspecified: Secondary | ICD-10-CM | POA: Diagnosis present

## 2013-06-20 DIAGNOSIS — R5381 Other malaise: Secondary | ICD-10-CM | POA: Diagnosis present

## 2013-06-20 DIAGNOSIS — Z8249 Family history of ischemic heart disease and other diseases of the circulatory system: Secondary | ICD-10-CM

## 2013-06-20 DIAGNOSIS — G893 Neoplasm related pain (acute) (chronic): Secondary | ICD-10-CM | POA: Diagnosis present

## 2013-06-20 DIAGNOSIS — C7951 Secondary malignant neoplasm of bone: Secondary | ICD-10-CM | POA: Diagnosis present

## 2013-06-20 DIAGNOSIS — C61 Malignant neoplasm of prostate: Secondary | ICD-10-CM

## 2013-06-20 DIAGNOSIS — R5383 Other fatigue: Secondary | ICD-10-CM

## 2013-06-20 DIAGNOSIS — Z8719 Personal history of other diseases of the digestive system: Secondary | ICD-10-CM

## 2013-06-20 DIAGNOSIS — K5731 Diverticulosis of large intestine without perforation or abscess with bleeding: Principal | ICD-10-CM | POA: Diagnosis present

## 2013-06-20 DIAGNOSIS — I10 Essential (primary) hypertension: Secondary | ICD-10-CM | POA: Diagnosis present

## 2013-06-20 DIAGNOSIS — K5900 Constipation, unspecified: Secondary | ICD-10-CM | POA: Diagnosis present

## 2013-06-20 DIAGNOSIS — K625 Hemorrhage of anus and rectum: Secondary | ICD-10-CM

## 2013-06-20 DIAGNOSIS — R739 Hyperglycemia, unspecified: Secondary | ICD-10-CM

## 2013-06-20 DIAGNOSIS — F411 Generalized anxiety disorder: Secondary | ICD-10-CM | POA: Diagnosis present

## 2013-06-20 DIAGNOSIS — N400 Enlarged prostate without lower urinary tract symptoms: Secondary | ICD-10-CM | POA: Diagnosis present

## 2013-06-20 DIAGNOSIS — K922 Gastrointestinal hemorrhage, unspecified: Secondary | ICD-10-CM | POA: Insufficient documentation

## 2013-06-20 DIAGNOSIS — R109 Unspecified abdominal pain: Secondary | ICD-10-CM | POA: Diagnosis present

## 2013-06-20 LAB — CBC WITH DIFFERENTIAL/PLATELET
BASOS PCT: 0 % (ref 0–1)
Basophils Absolute: 0 10*3/uL (ref 0.0–0.1)
Eosinophils Absolute: 0 10*3/uL (ref 0.0–0.7)
Eosinophils Relative: 0 % (ref 0–5)
HEMATOCRIT: 22.2 % — AB (ref 39.0–52.0)
HEMOGLOBIN: 6.9 g/dL — AB (ref 13.0–17.0)
Lymphocytes Relative: 8 % — ABNORMAL LOW (ref 12–46)
Lymphs Abs: 1.7 10*3/uL (ref 0.7–4.0)
MCH: 27.4 pg (ref 26.0–34.0)
MCHC: 31.1 g/dL (ref 30.0–36.0)
MCV: 88.1 fL (ref 78.0–100.0)
MONO ABS: 0.8 10*3/uL (ref 0.1–1.0)
Monocytes Relative: 4 % (ref 3–12)
Neutro Abs: 18.5 10*3/uL — ABNORMAL HIGH (ref 1.7–7.7)
Neutrophils Relative %: 88 % — ABNORMAL HIGH (ref 43–77)
Platelets: 263 10*3/uL (ref 150–400)
RBC: 2.52 MIL/uL — ABNORMAL LOW (ref 4.22–5.81)
RDW: 20.1 % — AB (ref 11.5–15.5)
WBC: 21 10*3/uL — ABNORMAL HIGH (ref 4.0–10.5)

## 2013-06-20 LAB — COMPREHENSIVE METABOLIC PANEL
ALT: 7 U/L (ref 0–53)
AST: 21 U/L (ref 0–37)
Albumin: 2.4 g/dL — ABNORMAL LOW (ref 3.5–5.2)
Alkaline Phosphatase: 271 U/L — ABNORMAL HIGH (ref 39–117)
BUN: 9 mg/dL (ref 6–23)
CO2: 20 meq/L (ref 19–32)
Calcium: 7.2 mg/dL — ABNORMAL LOW (ref 8.4–10.5)
Chloride: 101 mEq/L (ref 96–112)
Creatinine, Ser: 0.69 mg/dL (ref 0.50–1.35)
Glucose, Bld: 132 mg/dL — ABNORMAL HIGH (ref 70–99)
Potassium: 4 mEq/L (ref 3.7–5.3)
Sodium: 135 mEq/L — ABNORMAL LOW (ref 137–147)
Total Bilirubin: 0.3 mg/dL (ref 0.3–1.2)
Total Protein: 5.4 g/dL — ABNORMAL LOW (ref 6.0–8.3)

## 2013-06-20 LAB — URINALYSIS, ROUTINE W REFLEX MICROSCOPIC
Bilirubin Urine: NEGATIVE
Glucose, UA: NEGATIVE mg/dL
HGB URINE DIPSTICK: NEGATIVE
Ketones, ur: NEGATIVE mg/dL
Leukocytes, UA: NEGATIVE
NITRITE: NEGATIVE
Protein, ur: NEGATIVE mg/dL
SPECIFIC GRAVITY, URINE: 1.014 (ref 1.005–1.030)
UROBILINOGEN UA: 0.2 mg/dL (ref 0.0–1.0)
pH: 6 (ref 5.0–8.0)

## 2013-06-20 LAB — ABO/RH: ABO/RH(D): O POS

## 2013-06-20 LAB — POC OCCULT BLOOD, ED: FECAL OCCULT BLD: POSITIVE — AB

## 2013-06-20 LAB — PREPARE RBC (CROSSMATCH)

## 2013-06-20 MED ORDER — SODIUM CHLORIDE 0.9 % IV BOLUS (SEPSIS): 1000.0000 mL | Freq: Once | INTRAVENOUS | Status: DC

## 2013-06-20 MED ORDER — HYDROMORPHONE HCL PF 1 MG/ML IJ SOLN
1.0000 mg | Freq: Once | INTRAMUSCULAR | Status: AC
  Administered 2013-06-20: 1 mg via INTRAVENOUS
  Filled 2013-06-20: qty 1

## 2013-06-20 NOTE — ED Notes (Signed)
Pt reports that he started having rectal bleeding 3 days ago, today the bleeding has worsened. Pt's wife states she was able to control the bleeding using a depend(only one used today). States he has a stinging feeling on the rectal area. Of note pt is ongoing treatment for prostrate cancer and states he wanted to have the bleed assessed. Pt has no apparent signs of distress

## 2013-06-20 NOTE — H&P (Addendum)
[facility] History and Physical  Jeff GLENNEY [id number] DOB: 06-23-1945 DOA: 06/20/2013  Referring physician: Dr. Jeneen Rinks PCP: [name], MD   Chief Complaint:  Bright red blood per rectum X2 days  HPI:  68 year old male with history of metastatic prostate cancer status post chemotherapy and radiation (last chemotherapy 2 weeks back ) presented to the ED with bright red blood per rectum for 2 days. Patient reports that he has been having off-and-on blood per rectum for last 2 months which have not been very severe and his hemoglobin been fairly stable. Since yesterday he has had 3-4 episodes of large amount of bright red blood per rectum with clots. The bleeding had been painless. He reports feeling tired but denies any dizziness or near syncopal episode. He reports having some lower abdominal pain. His last bowel movement was this afternoon. He denies any history of hemorrhoids a previous history of GI bleed. Denies use of NSAIDs.. Patient denies headache, dizziness, fever, chills, nausea , vomiting, chest pain, palpitations, SOB, , bowel or urinary symptoms. Denies change in weight or appetite.  Course in the ED Patient's vitals were stable. Blood work done showed leukocytosis with WBC of 21,000, hemoglobin of 6.9 and hematocrit of 22.2 (last hemoglobin was 8.72 weeks back). Chemistry unremarkable except for sodium of 135 and calcium of 7.2. Blood was of 132. Physical exam in the ED showed no signs of hemorrhoids but had dried blood in anal area. Patient ordered for 2 units PRBC and [facility] consulted for admission to telemetry. [facility] (Dr. Paulita Fujita) consulted by ED .  Review of Systems:  Constitutional: Denies fever, chills, diaphoresis, appetite change . Fatigue+.  HEENT: Denies ear pain, congestion, sore throat, rhinorrhea, sneezing, mouth sores, trouble swallowing, neck pain, neck stiffness Respiratory: Denies SOB, DOE, cough, chest tightness,  and wheezing.    Cardiovascular: Denies chest pain, palpitations and leg swelling.  Gastrointestinal: Denies nausea, vomiting, abdominal pain, diarrhea, constipation, blood in stool and abdominal distention+.  Genitourinary: Denies dysuria, urgency, frequency, hematuria, flank pain and difficulty urinating.  Musculoskeletal: Back pain, Denies myalgias,  joint swelling, arthralgias and gait problem.  Skin: Denies pallor, rash and wound.  Neurological: Denies dizziness, seizures, syncope, weakness, light-headedness, numbness and headaches.  Psychiatric/Behavioral: Denies  confusion, nervousness, sleep disturbance and agitation   Past Medical History  Diagnosis Date  . Shingles 06/2009  . BPH (benign prostatic hyperplasia)   . DIVERTICULITIS, HX OF 2010  . ANEMIA-NOS   . ANXIETY   . HYPERTENSION   . 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 Paraphimosis     hx  . Thrombocytopenia     hx  . Anxiety   . Hx of radiation therapy 03/01/13-03/22/13    lumbar spine 237.5Gy/56fx   Past Surgical History  Procedure Laterality Date  . Left ankle surgery  1968  . Vasectomy  1978   Social History:  reports that he has never smoked. He has never used smokeless tobacco. He reports that he does not drink alcohol or use illicit drugs.  No Known Allergies  Family History  Problem Relation Age of Onset  . Hypertension Mother   . Glaucoma Mother   . Heart disease Father   . Hypertension Father     Prior to Admission medications   Medication Sig Start Date End Date Taking? Authorizing Provider  atenolol (TENORMIN) 50 MG tablet Take 1.5 tablets (75 mg total) by mouth  every evening. 11/18/12  Yes Rowe Clack, MD  Calcium Carbonate-Vitamin D (CALCIUM 600+D) 600-400 MG-UNIT per tablet Take 2 tablets by mouth daily.    Yes Historical Provider, MD  denosumab (XGEVA) 120 MG/1.7ML SOLN Inject 120 mg into the skin every 30 (thirty)  days. Patient receives injection in drs office   Yes Historical Provider, MD  diphenhydrAMINE (SOMINEX) 25 MG tablet Take 25 mg by mouth 2 (two) times daily.   Yes Historical Provider, MD  docusate sodium (COLACE) 100 MG capsule Take 100 mg by mouth at bedtime as needed for mild constipation.   Yes Historical Provider, MD  HYDROmorphone (DILAUDID) 4 MG tablet Take 1 tablet (4 mg total) by mouth every 4 (four) hours as needed for severe pain. 06/09/13  Yes Wyatt Portela, MD  lidocaine-prilocaine (EMLA) cream Apply topically as needed. Apply approximately 1/2 teaspoon to skin over port-a-cath. Do not rub in. Place clear plastic over cream. 1-1 and 1/2 hours prior to chemotherapy treatment. 12/03/12  Yes Maryanna Shape, NP  ondansetron (ZOFRAN) 8 MG tablet Take 8 mg by mouth every 8 (eight) hours as needed for nausea or vomiting.   Yes Historical Provider, MD  OxyCODONE HCl ER 60 MG T12A Take two tablets three times a day. 06/09/13  Yes Wyatt Portela, MD  pegfilgrastim (NEULASTA) 6 MG/0.6ML injection Inject 6 mg into the skin once.   Yes Historical Provider, MD  polyethylene glycol (MIRALAX / GLYCOLAX) packet Take 17 g by mouth daily.   Yes Historical Provider, MD  predniSONE (DELTASONE) 5 MG tablet Take 5 mg by mouth every other day.   Yes Historical Provider, MD  prochlorperazine (COMPAZINE) 10 MG tablet Take 10 mg by mouth every 6 (six) hours as needed for nausea or vomiting.   Yes Historical Provider, MD  Tamsulosin HCl (FLOMAX) 0.4 MG CAPS Take 0.4 mg by mouth daily.    Yes Historical Provider, MD  leuprolide (LUPRON DEPOT) 22.5 MG injection Inject 22.5 mg into the muscle every 6 (six) months. Or as directed by urology 01/23/12   Rowe Clack, MD     Physical Exam:  Filed Vitals:   06/20/13 1937 06/20/13 2238  BP: 95/58 104/67  Pulse: 94   Temp: 98.7 F (37.1 C) 98 F (36.7 C)  TempSrc: Oral Oral  Resp: 14 17  Height: 5' (1.524 m)   Weight: 63.504 kg (140 lb)   SpO2: 99% 97%     Constitutional: Vital signs reviewed.  Patient is an elderly male lying in bed appears fatigued and pale HEENT: Pallor present, no icterus, moist oral mucosa, no cervical lymphadenopathy Cardiovascular: RRR, S1 normal, S2 normal, no MRG Chest: Right-sided Port-A-Cath. CTAB, no wheezes, rales, or rhonchi Abdominal: Soft. Non-tender, mildly distended, bowel sounds are normal, no masses, organomegaly, or guarding present. Rectal exam as per ED note.  Ext: warm, no edema, low back tenderness Neurological: A&O x3, non focal  Labs on Admission:  Basic Metabolic Panel:  Recent Labs Lab 06/20/13 2039  NA 135*  K 4.0  CL 101  CO2 20  GLUCOSE 132*  BUN 9  CREATININE 0.69  CALCIUM 7.2*   Liver Function Tests:  Recent Labs Lab 06/20/13 2039  AST 21  ALT 7  ALKPHOS 271*  BILITOT 0.3  PROT 5.4*  ALBUMIN 2.4*   No results found for this basename: LIPASE, AMYLASE,  in the last 168 hours No results found for this basename: AMMONIA,  in the last 168 hours CBC:  Recent Labs  Lab 06/20/13 2039  WBC 21.0*  NEUTROABS 18.5*  HGB 6.9*  HCT 22.2*  MCV 88.1  PLT 263   Cardiac Enzymes: No results found for this basename: CKTOTAL, CKMB, CKMBINDEX, TROPONINI,  in the last 168 hours BNP: No components found with this basename: POCBNP,  CBG: No results found for this basename: GLUCAP,  in the last 168 hours  Radiological Exams on Admission: No results found.    Assessment/Plan Principal Problem:   Rectal bleeding with anemia Possibly secondary to diverticular bleed. No signs of hemorrhoids on exam by ED provider. -Admit to telemetry. Patient's hemoglobin on presentation of 6.9 which has dropped from 8.7 about 2 weeks back. Ordered 2 units PRBC in the ED. Monitor H&H every 6 hours. [facility] consulted by ED (Dr. Paulita Fujita ) and will follow up with recommendations.  Active Problems: Metastatic adenocarcinoma of the prostate Follows with Dr. Alen Blew. He has pelvic  bone metastases   Patient currently on chemotherapy with jevtana ( completed 11th cycle on 2/18) and has been getting palliative radiotherapy. Further chemotherapy stopped after last cycle as patient has not benefited from it. He has been referred to radiation oncology for trial of Xofigo and has appointment with Dr. Lisbeth Renshaw tomorrow. He is also getting Lupron and monthly Xgeva at [facility] urology. Patient has worsening low back pain and pain medications were recently adjusted as outpatient. He is on high-dose OxyContin (120 mg 2 times a day) and when necessary Dilaudid which I will continue. -Continue low dose prednisone and antiemetics. -Continue Flomax. -Will notify Dr. Alen Blew ( added him as consult)  Hypertension Stable. Continue metoprolol  Leukocytosis Secondary to neulesta received after chemo recently  Abdominal fullness and distention  check UA and abdominal x ray  History of diverticulosis Patient reports colonoscopy done in [place] 6 years back but does not remember the results.  Constipation Continue bowel regimen    Low-back pain Secondary to metastatic disease to the bone. Continue high-dose pain medications.      Diet: Regular  DVT prophylaxis: SCDs   Code Status:full code Family Communication: discussed with patient and his wife at bedside Disposition Plan: Home once stable  Tavoris Brisk, [facility] Pager [phone]  Total time spent on admission :70 minutes  If 7PM-7AM, please contact night-coverage [email or web address] Password [email or web address] 06/20/2013, 10:52 PM

## 2013-06-20 NOTE — ED Notes (Signed)
Pt reports having "a lot" of red blood coming his rectum today.

## 2013-06-20 NOTE — ED Provider Notes (Signed)
CSN: [id number]     Arrival date & time 06/20/13  1926 History   First MD Initiated Contact with Patient 06/20/13 2018     Chief Complaint  Patient presents with  . Rectal Bleeding     (Consider location/radiation/quality/duration/timing/severity/associated sxs/prior Treatment) The history is provided by the patient. No language interpreter was used.  Jeff Jimenez is a 68 y/o M with PMhx of prostate cancer with mets to bone, HTN, thrombocytopenia, diverticulitis, BPH presenting to the ED with rectal bleeding that has been intermittent for the past month with continuation and increase over the past 2 days. Patient reported that he has been experiencing bright red stools with each bowel movement over the past 2 days. Stated that the last BM he had consisted of blood clots. Stated that he has been feeling week. Patient currently on chemotherapy for prostate cancer that he was diagnosed with 4 years ago - being followed by oncologist Dr. Clelia Croft. Patient recently had chemo session on 06/09/2013. Denied abdominal pain, fever, chills, nausea, vomiting, hematuria, chest pain, shortness of breath, difficulty breathing, melena. PCP Dr. Felicity Coyer  Past Medical History  Diagnosis Date  . Shingles 06/2009  . BPH (benign prostatic hyperplasia)   . DIVERTICULITIS, HX OF 2010  . ANEMIA-NOS   . ANXIETY   . HYPERTENSION   . 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 Paraphimosis     hx  . Thrombocytopenia     hx  . Anxiety   . Hx of radiation therapy 03/01/13-03/22/13    lumbar spine 237.5Gy/74fx   Past Surgical History  Procedure Laterality Date  . Left ankle surgery  1968  . Vasectomy  1978   Family History  Problem Relation Age of Onset  . Hypertension Mother   . Glaucoma Mother   . Heart disease Father   . Hypertension Father    History  Substance Use Topics  . Smoking status: Never Smoker   . Smokeless tobacco:  Never Used     Comment: Optician, dispensing, retired - Married, lives with wife  . Alcohol Use: No    Review of Systems  Constitutional: Negative for fever and chills.  Respiratory: Negative for chest tightness and shortness of breath.   Cardiovascular: Negative for chest pain.  Gastrointestinal: Positive for blood in stool. Negative for nausea, vomiting and abdominal pain.  Neurological: Positive for weakness. Negative for dizziness.  All other systems reviewed and are negative.      Allergies  Review of patient's allergies indicates no known allergies.  Home Medications   Current Outpatient Rx  Name  Route  Sig  Dispense  Refill  . atenolol (TENORMIN) 50 MG tablet   Oral   Take 1.5 tablets (75 mg total) by mouth every evening.   30 tablet   9   . Calcium Carbonate-Vitamin D (CALCIUM 600+D) 600-400 MG-UNIT per tablet   Oral   Take 2 tablets by mouth daily.          Marland Kitchen denosumab (XGEVA) 120 MG/1.7ML SOLN   Subcutaneous   Inject 120 mg into the skin every 30 (thirty) days. Patient receives injection in drs office         . diphenhydrAMINE (SOMINEX) 25 MG tablet   Oral   Take 25 mg by mouth 2 (two) times daily.         Marland Kitchen docusate sodium (COLACE) 100 MG capsule   Oral   Take  100 mg by mouth at bedtime as needed for mild constipation.         Marland Kitchen HYDROmorphone (DILAUDID) 4 MG tablet   Oral   Take 1 tablet (4 mg total) by mouth every 4 (four) hours as needed for severe pain.   90 tablet   0   . lidocaine-prilocaine (EMLA) cream   Topical   Apply topically as needed. Apply approximately 1/2 teaspoon to skin over port-a-cath. Do not rub in. Place clear plastic over cream. 1-1 and 1/2 hours prior to chemotherapy treatment.   30 g   2     FAXED TO PHARMACY.   Marland Kitchen ondansetron (ZOFRAN) 8 MG tablet   Oral   Take 8 mg by mouth every 8 (eight) hours as needed for nausea or vomiting.         . OxyCODONE HCl ER 60 MG T12A      Take two tablets three times a day.   180  each   0   . pegfilgrastim (NEULASTA) 6 MG/0.6ML injection   Subcutaneous   Inject 6 mg into the skin once.         . polyethylene glycol (MIRALAX / GLYCOLAX) packet   Oral   Take 17 g by mouth daily.         . predniSONE (DELTASONE) 5 MG tablet   Oral   Take 5 mg by mouth every other day.         . prochlorperazine (COMPAZINE) 10 MG tablet   Oral   Take 10 mg by mouth every 6 (six) hours as needed for nausea or vomiting.         . Tamsulosin HCl (FLOMAX) 0.4 MG CAPS   Oral   Take 0.4 mg by mouth daily.          Marland Kitchen leuprolide (LUPRON DEPOT) 22.5 MG injection   Intramuscular   Inject 22.5 mg into the muscle every 6 (six) months. Or as directed by urology   1 each   0    BP 104/67  Pulse 94  Temp(Src) 98 F (36.7 C) (Oral)  Resp 17  Ht 5' (1.524 m)  Wt 140 lb (63.504 kg)  BMI 27.34 kg/m2  SpO2 97% Physical Exam  Nursing note and vitals reviewed. Constitutional: He is oriented to person, place, and time. He appears well-developed and well-nourished. No distress.  HENT:  Head: Normocephalic and atraumatic.  Mouth/Throat: No oropharyngeal exudate.  Dry mucous membranes  Eyes: Conjunctivae and EOM are normal. Pupils are equal, round, and reactive to light. Right eye exhibits no discharge. Left eye exhibits no discharge.  Neck: Normal range of motion. Neck supple. No tracheal deviation present.  Negative neck stiffness Negative nuchal rigidity Negative cervical lymphadenopathy  Cardiovascular: Normal rate, regular rhythm and normal heart sounds.  Exam reveals no friction rub.   No murmur heard. Pulses:      Radial pulses are 2+ on the right side, and 2+ on the left side.       Dorsalis pedis pulses are 2+ on the right side, and 2+ on the left side.  Pulmonary/Chest: Effort normal and breath sounds normal. No respiratory distress. He has no wheezes. He has no rales.  Portacath placed on right side of chest without erythema or sign of infection   Abdominal:  Soft. Bowel sounds are normal. There is no tenderness. There is no guarding.  Genitourinary: Guaiac positive stool.  Rectal Exam: Mild erythema, red raw skin identified around the anus. Dried  blood noted around the anus. Negative swelling, hemorrhoids, lesions, fissures, sores identified. Strong sphincter. Enlarged prostate palpated. Bright red blood on glove. Exam chaperoned  Musculoskeletal: Normal range of motion.  Full ROM to upper and lower extremities without difficulty noted, negative ataxia noted.  Lymphadenopathy:    He has no cervical adenopathy.  Neurological: He is alert and oriented to person, place, and time. No cranial nerve deficit. He exhibits normal muscle tone. Coordination normal.  Cranial nerves III-XII grossly intact Strength 5+/5+ to upper and lower extremities bilaterally with resistance applied, equal distribution noted  Skin: Skin is warm. He is not diaphoretic. There is pallor.  Psychiatric: He has a normal mood and affect. His behavior is normal. Thought content normal.    ED Course  Procedures (including critical care time)  10:18 PM This provider spoke with Dr. Clementeen Graham - discussed case, history, presentation, labs in great detail. Recommended patient to be admitted to the hospital as inpatient to Telemetry. Recommended GI consult.   10:38 PM This provider spoke with Dr. Paulita Fujita - GI - discussed case, history, presentation, and labs. Patient to be consulted by GI.   Results for orders placed during the hospital encounter of 06/20/13  CBC WITH DIFFERENTIAL      Result Value Ref Range   WBC 21.0 (*) 4.0 - 10.5 K/uL   RBC 2.52 (*) 4.22 - 5.81 MIL/uL   Hemoglobin 6.9 (*) 13.0 - 17.0 g/dL   HCT 22.2 (*) 39.0 - 52.0 %   MCV 88.1  78.0 - 100.0 fL   MCH 27.4  26.0 - 34.0 pg   MCHC 31.1  30.0 - 36.0 g/dL   RDW 20.1 (*) 11.5 - 15.5 %   Platelets 263  150 - 400 K/uL   Neutrophils Relative % 88 (*) 43 - 77 %   Lymphocytes Relative 8 (*) 12 - 46 %   Monocytes  Relative 4  3 - 12 %   Eosinophils Relative 0  0 - 5 %   Basophils Relative 0  0 - 1 %   Neutro Abs 18.5 (*) 1.7 - 7.7 K/uL   Lymphs Abs 1.7  0.7 - 4.0 K/uL   Monocytes Absolute 0.8  0.1 - 1.0 K/uL   Eosinophils Absolute 0.0  0.0 - 0.7 K/uL   Basophils Absolute 0.0  0.0 - 0.1 K/uL   WBC Morphology       Value: MODERATE LEFT SHIFT (>5% METAS AND MYELOS,OCC PRO NOTED)   Smear Review POLYCHROMASIA PRESENT    COMPREHENSIVE METABOLIC PANEL      Result Value Ref Range   Sodium 135 (*) 137 - 147 mEq/L   Potassium 4.0  3.7 - 5.3 mEq/L   Chloride 101  96 - 112 mEq/L   CO2 20  19 - 32 mEq/L   Glucose, Bld 132 (*) 70 - 99 mg/dL   BUN 9  6 - 23 mg/dL   Creatinine, Ser 0.69  0.50 - 1.35 mg/dL   Calcium 7.2 (*) 8.4 - 10.5 mg/dL   Total Protein 5.4 (*) 6.0 - 8.3 g/dL   Albumin 2.4 (*) 3.5 - 5.2 g/dL   AST 21  0 - 37 U/L   ALT 7  0 - 53 U/L   Alkaline Phosphatase 271 (*) 39 - 117 U/L   Total Bilirubin 0.3  0.3 - 1.2 mg/dL   GFR calc non Af Amer >90  >90 mL/min   GFR calc Af Amer >90  >90 mL/min  POC OCCULT BLOOD, ED  Result Value Ref Range   Fecal Occult Bld POSITIVE (*) NEGATIVE  TYPE AND SCREEN      Result Value Ref Range   ABO/RH(D) O POS     Antibody Screen NEG     Sample Expiration 06/23/2013     Unit Number [id number]     Blood Component Type RBC LR PHER1     Unit division 00     Status of Unit ALLOCATED     Transfusion Status OK TO TRANSFUSE     Crossmatch Result Compatible     Unit Number [id number]     Blood Component Type RED CELLS,LR     Unit division 00     Status of Unit ALLOCATED     Transfusion Status OK TO TRANSFUSE     Crossmatch Result Compatible    PREPARE RBC (CROSSMATCH)      Result Value Ref Range   Order Confirmation ORDER PROCESSED BY BLOOD BANK    ABO/RH      Result Value Ref Range   ABO/RH(D) O POS      Labs Review Labs Reviewed  CBC WITH DIFFERENTIAL - Abnormal; Notable for the following:    WBC 21.0 (*)    RBC 2.52 (*)    Hemoglobin  6.9 (*)    HCT 22.2 (*)    RDW 20.1 (*)    Neutrophils Relative % 88 (*)    Lymphocytes Relative 8 (*)    Neutro Abs 18.5 (*)    All other components within normal limits  COMPREHENSIVE METABOLIC PANEL - Abnormal; Notable for the following:    Sodium 135 (*)    Glucose, Bld 132 (*)    Calcium 7.2 (*)    Total Protein 5.4 (*)    Albumin 2.4 (*)    Alkaline Phosphatase 271 (*)    All other components within normal limits  POC OCCULT BLOOD, ED - Abnormal; Notable for the following:    Fecal Occult Bld POSITIVE (*)    All other components within normal limits  PATHOLOGIST SMEAR REVIEW  TYPE AND SCREEN  PREPARE RBC (CROSSMATCH)  ABO/RH   Imaging Review No results found.   EKG Interpretation None      MDM   Final diagnoses:  GI bleed  Prostate cancer   Medications  sodium chloride 0.9 % bolus 1,000 mL (not administered)  HYDROmorphone (DILAUDID) injection 1 mg (1 mg Intravenous Given 06/20/13 2233)   Filed Vitals:   06/20/13 1937 06/20/13 2238  BP: 95/58 104/67  Pulse: 94   Temp: 98.7 F (37.1 C) 98 F (36.7 C)  TempSrc: Oral Oral  Resp: 14 17  Height: 5' (1.524 m)   Weight: 140 lb (63.504 kg)   SpO2: 99% 97%    Patient presenting to the ED with rectal bleeding and has been ongoing for the past month with continuation of the past 2 days. Stated that with each bowel movement over the past 2 days he's noticed bright red stool-report of blood clots in stool today. Reported that he's been feeling weak. Denied abdominal pain, nausea, vomiting, diarrhea, fever, chills. Patient has history of prostate cancer with metastasis to the bone-was diagnosed approximately 4 years ago. Patient currently on chemotherapy with port placed on his right side of the chest. Patient recently had chemotherapy performed on 06/09/2013. With each chemotherapy patient has been given Neulasta, to increased white blood cell count. Patient being followed by oncologist-Dr. Alen Blew. Alert and oriented.  GCS 15. Heart rate and rhythm normal. Lungs clear  to auscultation to upper lower lobes. Radial and DP pulses 2+ bilaterally. Cap refill less than 3 seconds. Bowel sounds normal active in all 4 quadrants-soft upon palpation-benign abdominal exam. Patient appears pale. Full range of motion to upper lower extremities bilaterally. Strength intact with equal distribution. Rectal exam noted red raw skin around the anus-discomfort upon palpation. Strong sphincter tone. Bright red blood on glove. Enlarged prostate palpated. Fecal occult positive. CBC noted elevated white blood cell count of 21.0-patient on Neulasta with each chemo session. Hemoglobin noted to be 6.9 when compared to one week ago hemoglobin was 8.6. Hematocrit instructions 27.7 from last week to 22.2 today. CMP noted mild low sodium of 135, low calcium of 7.2. Alkaline phosphatase elevated 271. Patient presenting to ED with rectal bleed with hemoglobin has dropped from 8.6 to 6.9 within the course of one week. Patient appears pale and weak. Patient to be admitted to hospital for transfusion and lower GI bleed-patient actively bleeding upon physical exam. Packed red blood cells ordered to be administered to patient. Discussed case with [facility]-patient to be admitted to telemetry as inpatient. Discussed case with GI, GI to consult patient. Discussed plan for admission with patient and family at bedside-agreed to plan. Patient stable for transfer.  Jamse Mead, PA-C 06/21/13 [address] PA-C 06/21/13 1250

## 2013-06-20 NOTE — ED Notes (Signed)
Bed: WA06 Expected date:  Expected time:  Means of arrival:  Comments: Hold for [employer]

## 2013-06-21 ENCOUNTER — Ambulatory Visit
Admission: RE | Admit: 2013-06-21 | Discharge: 2013-06-21 | Disposition: A | Discharge status: Home / Self Care | Payer: Medicare Other | Source: Ambulatory Visit | Attending: Radiation Oncology | Admitting: Radiation Oncology

## 2013-06-21 ENCOUNTER — Ambulatory Visit: Payer: Medicare Other

## 2013-06-21 DIAGNOSIS — C61 Malignant neoplasm of prostate: Secondary | ICD-10-CM

## 2013-06-21 DIAGNOSIS — I10 Essential (primary) hypertension: Secondary | ICD-10-CM

## 2013-06-21 DIAGNOSIS — D500 Iron deficiency anemia secondary to blood loss (chronic): Secondary | ICD-10-CM

## 2013-06-21 LAB — CBC
HCT: 30.1 % — ABNORMAL LOW (ref 39.0–52.0)
HCT: 31.1 % — ABNORMAL LOW (ref 39.0–52.0)
HEMATOCRIT: 31 % — AB (ref 39.0–52.0)
HEMOGLOBIN: 10 g/dL — AB (ref 13.0–17.0)
Hemoglobin: 9.6 g/dL — ABNORMAL LOW (ref 13.0–17.0)
Hemoglobin: 9.9 g/dL — ABNORMAL LOW (ref 13.0–17.0)
MCH: 27.2 pg (ref 26.0–34.0)
MCH: 27.2 pg (ref 26.0–34.0)
MCH: 27 pg (ref 26.0–34.0)
MCHC: 31.9 g/dL (ref 30.0–36.0)
MCHC: 32.3 g/dL (ref 30.0–36.0)
MCHC: 31.8 g/dL (ref 30.0–36.0)
MCV: 85.3 fL (ref 78.0–100.0)
MCV: 84.5 fL (ref 78.0–100.0)
MCV: 84.7 fL (ref 78.0–100.0)
PLATELETS: 194 10*3/uL (ref 150–400)
Platelets: 208 10*3/uL (ref 150–400)
Platelets: 203 10*3/uL (ref 150–400)
RBC: 3.53 MIL/uL — ABNORMAL LOW (ref 4.22–5.81)
RBC: 3.67 MIL/uL — ABNORMAL LOW (ref 4.22–5.81)
RBC: 3.67 MIL/uL — AB (ref 4.22–5.81)
RDW: 18.2 % — ABNORMAL HIGH (ref 11.5–15.5)
RDW: 18.8 % — ABNORMAL HIGH (ref 11.5–15.5)
RDW: 19 % — ABNORMAL HIGH (ref 11.5–15.5)
WBC: 18.8 10*3/uL — AB (ref 4.0–10.5)
WBC: 20.7 10*3/uL — AB (ref 4.0–10.5)
WBC: 21.2 10*3/uL — ABNORMAL HIGH (ref 4.0–10.5)

## 2013-06-21 LAB — PATHOLOGIST SMEAR REVIEW

## 2013-06-21 MED ORDER — SODIUM CHLORIDE 0.9 % IJ SOLN
3.0000 mL | Freq: Two times a day (BID) | INTRAMUSCULAR | Status: DC
  Administered 2013-06-21 – 2013-06-22 (×3): 3 mL via INTRAVENOUS

## 2013-06-21 MED ORDER — HYDROMORPHONE HCL 4 MG PO TABS
4.0000 mg | ORAL_TABLET | ORAL | Status: DC | PRN
  Administered 2013-06-21: 4 mg via ORAL
  Filled 2013-06-21: qty 1

## 2013-06-21 MED ORDER — ONDANSETRON HCL 4 MG PO TABS
8.0000 mg | ORAL_TABLET | Freq: Three times a day (TID) | ORAL | Status: DC | PRN
  Administered 2013-06-22: 8 mg via ORAL
  Filled 2013-06-21: qty 2

## 2013-06-21 MED ORDER — TAMSULOSIN HCL 0.4 MG PO CAPS
0.4000 mg | ORAL_CAPSULE | Freq: Every day | ORAL | Status: DC
  Administered 2013-06-21 – 2013-06-23 (×3): 0.4 mg via ORAL
  Filled 2013-06-21 (×3): qty 1

## 2013-06-21 MED ORDER — PROCHLORPERAZINE MALEATE 10 MG PO TABS
10.0000 mg | ORAL_TABLET | Freq: Four times a day (QID) | ORAL | Status: DC | PRN
  Filled 2013-06-21: qty 1

## 2013-06-21 MED ORDER — DIPHENHYDRAMINE HCL (SLEEP) 25 MG PO TABS: 25.0000 mg | ORAL_TABLET | Freq: Two times a day (BID) | ORAL | Status: DC

## 2013-06-21 MED ORDER — PREDNISONE 5 MG PO TABS
5.0000 mg | ORAL_TABLET | ORAL | Status: DC
  Administered 2013-06-21 – 2013-06-23 (×2): 5 mg via ORAL
  Filled 2013-06-21 (×2): qty 1

## 2013-06-21 MED ORDER — POLYETHYLENE GLYCOL 3350 17 G PO PACK
17.0000 g | PACK | Freq: Every day | ORAL | Status: DC
  Administered 2013-06-21: 17 g via ORAL
  Filled 2013-06-21 (×2): qty 1

## 2013-06-21 MED ORDER — SODIUM CHLORIDE 0.9 % IV SOLN
INTRAVENOUS | Status: DC
  Administered 2013-06-21: 1000 mL/h via INTRAVENOUS
  Administered 2013-06-21: 1000 mL via INTRAVENOUS
  Administered 2013-06-22: 23:00:00 via INTRAVENOUS

## 2013-06-21 MED ORDER — ATENOLOL 25 MG PO TABS
75.0000 mg | ORAL_TABLET | Freq: Every evening | ORAL | Status: DC
  Administered 2013-06-21 – 2013-06-22 (×2): 75 mg via ORAL
  Filled 2013-06-21 (×3): qty 1

## 2013-06-21 MED ORDER — OXYCODONE HCL ER 40 MG PO T12A
120.0000 mg | EXTENDED_RELEASE_TABLET | Freq: Three times a day (TID) | ORAL | Status: DC
  Administered 2013-06-21 – 2013-06-23 (×7): 120 mg via ORAL
  Filled 2013-06-21 (×4): qty 3
  Filled 2013-06-21: qty 6
  Filled 2013-06-21 (×2): qty 3

## 2013-06-21 MED ORDER — DIPHENHYDRAMINE HCL 25 MG PO CAPS
25.0000 mg | ORAL_CAPSULE | Freq: Two times a day (BID) | ORAL | Status: DC
Start: 2013-06-21 — End: 2013-06-23
  Administered 2013-06-21 – 2013-06-23 (×6): 25 mg via ORAL
  Filled 2013-06-21 (×8): qty 1

## 2013-06-21 MED ORDER — DOCUSATE SODIUM 100 MG PO CAPS
100.0000 mg | ORAL_CAPSULE | Freq: Every evening | ORAL | Status: DC | PRN
  Filled 2013-06-21: qty 1

## 2013-06-21 MED ORDER — SODIUM CHLORIDE 0.9 % IJ SOLN
10.0000 mL | INTRAMUSCULAR | Status: DC | PRN
  Administered 2013-06-21 – 2013-06-23 (×6): 10 mL

## 2013-06-21 MED ORDER — CALCIUM CARBONATE-VITAMIN D 500-200 MG-UNIT PO TABS
2.0000 | ORAL_TABLET | Freq: Every day | ORAL | Status: DC
  Administered 2013-06-21 – 2013-06-23 (×3): 2 via ORAL
  Filled 2013-06-21 (×3): qty 2

## 2013-06-21 NOTE — Progress Notes (Signed)
Patient ID: Jeff Jimenez, male   DOB: May 21, 1945, 68 y.o.   MRN: [id number] Referring Provider: Dr. Rockne Menghini Primary Care Physician:  [name], MD Primary Gastroenterologist:  Althia Forts  Reason for Consultation:  Rectal bleeding  HPI: Jeff Jimenez is a 68 y.o. male with metastatic prostate cancer to the bone who is s/p radiation treatments that finished in December presents with the acute onset of BRBPR 2 days ago. Reports large amount of bright red bleeding that occurred with formed brown stool once each day for the past 2 days. Saw clots with the bleeding as well. Denies any associated abdominal pain, rectal pain, rectal burning or rectal itching with the bleeding. Denies weakness or dizziness. Denies N/V. Reports having rectal bleeding 6 years ago and reportedly had a colonoscopy (his last colonoscopy) at that time in [place], [place]. He was told he had diverticulosis and that was thought to be the source. Hgb 6.9 on admit and given 2 U PRBCs and Hgb today of 9.6. No further bleeding since admit. Taken off of chemotherapy recently since it reportedly was not effective. Radiation to be resumed in the near future. Wife at bedside.  Past Medical History  Diagnosis Date  . Shingles 06/2009  . BPH (benign prostatic hyperplasia)   . DIVERTICULITIS, HX OF 2010  . ANEMIA-NOS   . ANXIETY   . HYPERTENSION   . 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 Paraphimosis     hx  . Thrombocytopenia     hx  . Anxiety   . Hx of radiation therapy 03/01/13-03/22/13    lumbar spine 237.5Gy/32fx    Past Surgical History  Procedure Laterality Date  . Left ankle surgery  1968  . Vasectomy  1978    Prior to Admission medications   Medication Sig Start Date End Date Taking? Authorizing Provider  atenolol (TENORMIN) 50 MG tablet Take 1.5 tablets (75 mg total) by mouth every evening. 11/18/12  Yes Rowe Clack, MD  Calcium  Carbonate-Vitamin D (CALCIUM 600+D) 600-400 MG-UNIT per tablet Take 2 tablets by mouth daily.    Yes Historical Provider, MD  denosumab (XGEVA) 120 MG/1.7ML SOLN Inject 120 mg into the skin every 30 (thirty) days. Patient receives injection in drs office   Yes Historical Provider, MD  diphenhydrAMINE (SOMINEX) 25 MG tablet Take 25 mg by mouth 2 (two) times daily.   Yes Historical Provider, MD  docusate sodium (COLACE) 100 MG capsule Take 100 mg by mouth at bedtime as needed for mild constipation.   Yes Historical Provider, MD  HYDROmorphone (DILAUDID) 4 MG tablet Take 1 tablet (4 mg total) by mouth every 4 (four) hours as needed for severe pain. 06/09/13  Yes Wyatt Portela, MD  lidocaine-prilocaine (EMLA) cream Apply topically as needed. Apply approximately 1/2 teaspoon to skin over port-a-cath. Do not rub in. Place clear plastic over cream. 1-1 and 1/2 hours prior to chemotherapy treatment. 12/03/12  Yes Maryanna Shape, NP  ondansetron (ZOFRAN) 8 MG tablet Take 8 mg by mouth every 8 (eight) hours as needed for nausea or vomiting.   Yes Historical Provider, MD  OxyCODONE HCl ER 60 MG T12A Take two tablets three times a day. 06/09/13  Yes Wyatt Portela, MD  pegfilgrastim (NEULASTA) 6 MG/0.6ML injection Inject 6 mg into the skin once.   Yes Historical Provider, MD  polyethylene glycol (MIRALAX / GLYCOLAX) packet Take 17 g by mouth  daily.   Yes Historical Provider, MD  predniSONE (DELTASONE) 5 MG tablet Take 5 mg by mouth every other day.   Yes Historical Provider, MD  prochlorperazine (COMPAZINE) 10 MG tablet Take 10 mg by mouth every 6 (six) hours as needed for nausea or vomiting.   Yes Historical Provider, MD  Tamsulosin HCl (FLOMAX) 0.4 MG CAPS Take 0.4 mg by mouth daily.    Yes Historical Provider, MD  leuprolide (LUPRON DEPOT) 22.5 MG injection Inject 22.5 mg into the muscle every 6 (six) months. Or as directed by urology 01/23/12   Rowe Clack, MD    Scheduled Meds: . atenolol  75 mg Oral  QPM  . calcium-vitamin D  2 tablet Oral Daily  . diphenhydrAMINE  25 mg Oral BID  . OxyCODONE  120 mg Oral TID  . polyethylene glycol  17 g Oral Daily  . predniSONE  5 mg Oral QODAY  . sodium chloride  1,000 mL Intravenous Once  . sodium chloride  3 mL Intravenous Q12H  . tamsulosin  0.4 mg Oral Daily   Continuous Infusions: . sodium chloride 1,000 mL (06/21/13 0630)   PRN Meds:.docusate sodium, HYDROmorphone, ondansetron, prochlorperazine, sodium chloride  Allergies as of 06/20/2013  . (No Known Allergies)    Family History  Problem Relation Age of Onset  . Hypertension Mother   . Glaucoma Mother   . Heart disease Father   . Hypertension Father     History   Social History  . Marital Status: Married    Spouse Name: N/A    Number of Children: N/A  . Years of Education: N/A   Occupational History  . Not on file.   Social History Main Topics  . Smoking status: Never Smoker   . Smokeless tobacco: Never Used     Comment: Company secretary, retired - Married, lives with wife  . Alcohol Use: No  . Drug Use: No  . Sexual Activity: No   Other Topics Concern  . Not on file   Social History Narrative  . No narrative on file    Review of Systems: All negative except as stated above in HPI.  Physical Exam: Vital signs: Filed Vitals:   06/21/13 0608  BP: 110/66  Pulse: 79  Temp: 98.2  Resp: 20   Last BM Date: 06/20/13 General:   Lethargic, pale, well-nourished, pleasant and cooperative in NAD HEENT: anicteric; no oral lesions Neck: supple, nontender Lungs:  Clear throughout to auscultation.   No wheezes, crackles, or rhonchi. No acute distress. Heart:  Regular rate and rhythm; no murmurs, clicks, rubs,  or gallops. Abdomen: diffusely tender with voluntary guarding, soft, nondistended, +BS  Rectal:  Deferred Ext: no edema  GI:  Lab Results:  Recent Labs  06/20/13 2039 06/21/13 0615  WBC 21.0* 18.8*  HGB 6.9* 9.6*  HCT 22.2* 30.1*  PLT 263 194    BMET  Recent Labs  06/20/13 2039  NA 135*  K 4.0  CL 101  CO2 20  GLUCOSE 132*  BUN 9  CREATININE 0.69  CALCIUM 7.2*   LFT  Recent Labs  06/20/13 2039  PROT 5.4*  ALBUMIN 2.4*  AST 21  ALT 7  ALKPHOS 271*  BILITOT 0.3   PT/INR No results found for this basename: LABPROT, INR,  in the last 72 hours   Studies/Results: Dg Abd Portable 1v  06/21/2013   CLINICAL DATA:  Rectal bleeding. Mid abdominal pain. History of prostate cancer.  EXAM: PORTABLE ABDOMEN - 1 VIEW  COMPARISON:  10/29/2012 CT.  FINDINGS: Diffuse osseous metastatic disease with mild loss of height of vertebra.  Nonspecific bowel gas pattern without plain film evidence of bowel obstruction.  The possibility of free intraperitoneal air cannot be assessed on a supine view.  IMPRESSION: Nonspecific bowel gas pattern without plain film evidence of bowel obstruction.  Diffuse osseous metastatic disease.  Please see above.   Electronically Signed   By: Chauncey Cruel M.D.   On: 06/21/2013 00:24    Impression/Plan: 68 yo with metastatic prostate cancer presenting with painless hematochezia that I think is likely diverticular in origin. His recent radiation also in the differential but with recent completion of that and abrupt resolution of bleeding I think this is less likely. Would NOT recommend a colonoscopy unless bleeding recurs and persists. Supportive care. Start clear liquid diet. Follow H/H. Patient asks about going home today, which I think is premature but if his bleeding resolves and H/H stable, then maybe home tomorrow with close oncology f/u. Will follow.    LOS: 1 day   [place] C.  06/21/2013, 7:39 AM

## 2013-06-21 NOTE — Progress Notes (Signed)
Discussed with Dr. Alfonso Patten. Reider about pts blood pressure. She wants to continue giving the unit of PRBC's and to give the  bolus liter of NS that was not given in the ED.

## 2013-06-21 NOTE — Progress Notes (Signed)
IP PROGRESS NOTE  Subjective:   Patient known to me with prostate cancer and was admitted with anemia and GI bleed.  He is doing better today with very little complaints.  He reports very little bleeding and no new symptoms.  Objective:  Vital signs in last 24 hours: Temp:  [97.4 F (36.3 C)-98.7 F (37.1 C)] 98.2 F (36.8 C) (03/02 0540) Pulse Rate:  [66-94] 79 (03/02 0608) Resp:  [14-20] 20 (03/02 0540) BP: (87-110)/(55-67) 110/66 mmHg (03/02 0608) SpO2:  [93 %-100 %] 96 % (03/02 0540) Weight:  [139 lb 8 oz (63.277 kg)-140 lb (63.504 kg)] 139 lb 8 oz (63.277 kg) (03/02 0015) Weight change:  Last BM Date: 06/20/13  Intake/Output from previous day: 03/01 0701 - 03/02 0700 In: 1433.5 [I.V.:750; Blood:683.5] Out: 100 [Urine:100]  Mouth: mucous membranes moist, pharynx normal without lesions Resp: clear to auscultation bilaterally Cardio: regular rate and rhythm, S1, S2 normal, no murmur, click, rub or gallop GI: soft, non-tender; bowel sounds normal; no masses,  no organomegaly Extremities: extremities normal, atraumatic, no cyanosis or edema  Portacath without erythema  Lab Results:  Recent Labs  06/20/13 2039 06/21/13 0615  WBC 21.0* 18.8*  HGB 6.9* 9.6*  HCT 22.2* 30.1*  PLT 263 194    BMET  Recent Labs  06/20/13 2039  NA 135*  K 4.0  CL 101  CO2 20  GLUCOSE 132*  BUN 9  CREATININE 0.69  CALCIUM 7.2*    Studies/Results: Dg Abd Portable 1v  06/21/2013   CLINICAL DATA:  Rectal bleeding. Mid abdominal pain. History of prostate cancer.  EXAM: PORTABLE ABDOMEN - 1 VIEW  COMPARISON:  10/29/2012 CT.  FINDINGS: Diffuse osseous metastatic disease with mild loss of height of vertebra.  Nonspecific bowel gas pattern without plain film evidence of bowel obstruction.  The possibility of free intraperitoneal air cannot be assessed on a supine view.  IMPRESSION: Nonspecific bowel gas pattern without plain film evidence of bowel obstruction.  Diffuse osseous  metastatic disease.  Please see above.   Electronically Signed   By: Chauncey Cruel M.D.   On: 06/21/2013 00:24    Medications: I have reviewed the patient's current medications.  Assessment/Plan:  68 year old with the following issues:  1. Advanced prostate cancer: treatment will resume as outpatient upon his discharge.  He has a follow up at the [facility] set up.  He is meeting with Dr. Lisbeth Renshaw to possibly discuss Trudi Ida as a future option.    2. Anemia due to GI bleed: I agree with the current management. H/H stable so far.   3. Disposition: I am OK with discharge in th next 24 hours if he remains stable.    LOS: 1 day   Delores Edelstein 06/21/2013, 11:19 AM

## 2013-06-21 NOTE — Progress Notes (Signed)
PROGRESS NOTE   Jeff Jimenez UDJ:[id number] DOB: 1946-04-19 DOA: 06/20/2013 PCP: [name], MD  Brief narrative: Jeff Jimenez is an 67 y.o. male with history of metastatic prostate cancer status post chemotherapy and radiation (last chemotherapy 2 weeks back ) who was admitted 06/20/13 with rectal bleeding. Patient has been seen by gastroenterology with recommendations for conservative care.   Assessment/Plan: Principal Problem:   Rectal bleeding / acute blood loss anemia in a patient with a history of diverticulitis Likely diverticular. No further bleeding since last evening. Hemoglobin stable status post 2 units of packed red blood cells. Follow hemoglobin. Advance diet. Active Problems:   ADENOCARCINOMA, PROSTATE, METASTATIC Seen by Dr. Alen Blew 06/21/13. Treatment will resume his outpatient upon discharge.   HYPERTENSION Continue atenolol.   DVT Prophylaxis Continue SCDs.  Code Status: Full. Family Communication: No family at the bedside. Disposition Plan: Home when stable.   IV access:  Port-A-Cath  Medical Consultants:  Dr. Alen Blew, Oncology  Dr. Michail Sermon, Gastroenterology  Other Consultants:  Physical therapy  Anti-infectives:  None  HPI/Subjective: [name] has not had any bloody stools since last evening.  Some abdominal discomfort associated with a sense of urinary urgency.  Appetite OK.  Objective: Filed Vitals:   06/21/13 0430 06/21/13 0513 06/21/13 0540 06/21/13 0608  BP: 87/60 91/64 95/64  110/66  Pulse: 66 68 68 79  Temp: 97.4 F (36.3 C) 98.1 F (36.7 C) 98.2 F (36.8 C)   TempSrc: Oral Oral Oral   Resp: 18 18 20    Height:      Weight:      SpO2: 95% 93% 96%     Intake/Output Summary (Last 24 hours) at 06/21/13 1247 Last data filed at 06/21/13 1106  Gross per 24 hour  Intake 1433.5 ml  Output    550 ml  Net  883.5 ml    Exam: Gen:  NAD Cardiovascular:  RRR, No M/R/G Respiratory:  Lungs CTAB Gastrointestinal:  Abdomen  soft, NT/ND, + BS Extremities:  No C/E/C  Data Reviewed: Basic Metabolic Panel:  Recent Labs Lab 06/20/13 2039  NA 135*  K 4.0  CL 101  CO2 20  GLUCOSE 132*  BUN 9  CREATININE 0.69  CALCIUM 7.2*   GFR Estimated Creatinine Clearance: 70.1 ml/min (by C-G formula based on Cr of 0.69). Liver Function Tests:  Recent Labs Lab 06/20/13 2039  AST 21  ALT 7  ALKPHOS 271*  BILITOT 0.3  PROT 5.4*  ALBUMIN 2.4*   CBC:  Recent Labs Lab 06/20/13 2039 06/21/13 0615 06/21/13 1220  WBC 21.0* 18.8* 20.7*  NEUTROABS 18.5*  --   --   HGB 6.9* 9.6* 10.0*  HCT 22.2* 30.1* 31.0*  MCV 88.1 85.3 84.5  PLT 263 194 208     Procedures and Diagnostic Studies: Dg Abd Portable 1v  06/21/2013   CLINICAL DATA:  Rectal bleeding. Mid abdominal pain. History of prostate cancer.  EXAM: PORTABLE ABDOMEN - 1 VIEW  COMPARISON:  10/29/2012 CT.  FINDINGS: Diffuse osseous metastatic disease with mild loss of height of vertebra.  Nonspecific bowel gas pattern without plain film evidence of bowel obstruction.  The possibility of free intraperitoneal air cannot be assessed on a supine view.  IMPRESSION: Nonspecific bowel gas pattern without plain film evidence of bowel obstruction.  Diffuse osseous metastatic disease.  Please see above.   Electronically Signed   By: Chauncey Cruel M.D.   On: 06/21/2013 00:24    Scheduled Meds: . atenolol  75 mg  Oral QPM  . calcium-vitamin D  2 tablet Oral Daily  . diphenhydrAMINE  25 mg Oral BID  . OxyCODONE  120 mg Oral TID  . polyethylene glycol  17 g Oral Daily  . predniSONE  5 mg Oral QODAY  . sodium chloride  1,000 mL Intravenous Once  . sodium chloride  3 mL Intravenous Q12H  . tamsulosin  0.4 mg Oral Daily   Continuous Infusions: . sodium chloride 1,000 mL (06/21/13 0630)    Time spent: 25 minutes.   LOS: 1 day   RAMA,CHRISTINA  [facility] Pager [phone]. If unable to reach me by pager, please call my cell phone at [phone].  *Please note  that the hospitalists switch teams on Wednesdays. Please call the flow manager at [phone] if you are having difficulty reaching the hospitalist taking care of this patient as she can update you and provide the most up-to-date pager number of provider caring for the patient. If 8PM-8AM, please contact night-coverage at [email or web address], password [facility]  06/21/2013, 12:47 PM    **Disclaimer: This note was dictated with voice recognition software. Similar sounding words can inadvertently be transcribed and this note may contain transcription errors which may not have been corrected upon publication of note.**

## 2013-06-22 DIAGNOSIS — F411 Generalized anxiety disorder: Secondary | ICD-10-CM

## 2013-06-22 LAB — CBC
HCT: 29.7 % — ABNORMAL LOW (ref 39.0–52.0)
HCT: 30.9 % — ABNORMAL LOW (ref 39.0–52.0)
HCT: 30.7 % — ABNORMAL LOW (ref 39.0–52.0)
HEMATOCRIT: 29.4 % — AB (ref 39.0–52.0)
HEMOGLOBIN: 9.3 g/dL — AB (ref 13.0–17.0)
Hemoglobin: 9.4 g/dL — ABNORMAL LOW (ref 13.0–17.0)
Hemoglobin: 10 g/dL — ABNORMAL LOW (ref 13.0–17.0)
Hemoglobin: 9.8 g/dL — ABNORMAL LOW (ref 13.0–17.0)
MCH: 27 pg (ref 26.0–34.0)
MCH: 27.3 pg (ref 26.0–34.0)
MCH: 27.4 pg (ref 26.0–34.0)
MCH: 27.7 pg (ref 26.0–34.0)
MCHC: 31.6 g/dL (ref 30.0–36.0)
MCHC: 31.6 g/dL (ref 30.0–36.0)
MCHC: 31.9 g/dL (ref 30.0–36.0)
MCHC: 32.4 g/dL (ref 30.0–36.0)
MCV: 85.5 fL (ref 78.0–100.0)
MCV: 85.6 fL (ref 78.0–100.0)
MCV: 85.8 fL (ref 78.0–100.0)
MCV: 86.3 fL (ref 78.0–100.0)
Platelets: 179 10*3/uL (ref 150–400)
Platelets: 208 10*3/uL (ref 150–400)
Platelets: 215 10*3/uL (ref 150–400)
Platelets: 221 10*3/uL (ref 150–400)
RBC: 3.44 MIL/uL — ABNORMAL LOW (ref 4.22–5.81)
RBC: 3.44 MIL/uL — ABNORMAL LOW (ref 4.22–5.81)
RBC: 3.58 MIL/uL — ABNORMAL LOW (ref 4.22–5.81)
RBC: 3.61 MIL/uL — ABNORMAL LOW (ref 4.22–5.81)
RDW: 19.3 % — AB (ref 11.5–15.5)
RDW: 19.3 % — AB (ref 11.5–15.5)
RDW: 19.4 % — AB (ref 11.5–15.5)
RDW: 19.6 % — ABNORMAL HIGH (ref 11.5–15.5)
WBC: 16.9 10*3/uL — ABNORMAL HIGH (ref 4.0–10.5)
WBC: 18 10*3/uL — ABNORMAL HIGH (ref 4.0–10.5)
WBC: 18.2 10*3/uL — ABNORMAL HIGH (ref 4.0–10.5)
WBC: 19.3 10*3/uL — ABNORMAL HIGH (ref 4.0–10.5)

## 2013-06-22 LAB — TYPE AND SCREEN
ABO/RH(D): O POS
ANTIBODY SCREEN: NEGATIVE
UNIT DIVISION: 0
Unit division: 0

## 2013-06-22 MED ORDER — DOCUSATE SODIUM 100 MG PO CAPS
100.0000 mg | ORAL_CAPSULE | Freq: Two times a day (BID) | ORAL | Status: DC
  Administered 2013-06-22 – 2013-06-23 (×3): 100 mg via ORAL
  Filled 2013-06-22 (×4): qty 1

## 2013-06-22 MED ORDER — POLYETHYLENE GLYCOL 3350 17 G PO PACK
17.0000 g | PACK | Freq: Two times a day (BID) | ORAL | Status: DC
  Administered 2013-06-22 – 2013-06-23 (×3): 17 g via ORAL
  Filled 2013-06-22 (×4): qty 1

## 2013-06-22 NOTE — Consult Note (Signed)
The patient was scheduled to see me today to discuss possible radiation treatment. Specifically, we were to discuss Jeff Jimenez as a possible treatment at this time. I have previously discussed this with the patient and he has further discussed this option with Dr. Alen Blew who believes that this is a reasonable treatment to proceed with at this time given his prior treatment and response.  The patient has been recovering from what is felt to be a GI bleed from diverticulosis. He has received packed red blood cells and states that he feels better, although somewhat fatigued still. The patient indicates that he remains interested in [place] treatment. This is typically performed every several weeks through nuclear medicine in radiology. I will work with our staff to arrange this treatment on an outpatient basis. He is currently scheduled to see medical oncology again within the next couple of weeks as well. We will recheck his blood counts prior to beginning [place].

## 2013-06-22 NOTE — Progress Notes (Signed)
Jeff Jimenez 10:37 AM  Subjective: Patient without any GI complaints and no signs of bleeding and has not had a bowel movement in 2 days and eating regular food and wants to go home  Objective: Vital signs stable afebrile no acute distress abdomen is soft nontender hemoglobin stable  Assessment: Metastatic prostate cancer with resolved GI bleeding probably diverticular  Plan: Okay with me to go home happy to see back when necessary and repeat colonoscopy if needed per oncology team and either Dr. Michail Sermon or myself happy to see back as an outpatient  [facility]

## 2013-06-22 NOTE — Evaluation (Signed)
Physical Therapy Evaluation Patient Details Name: Jeff Jimenez MRN: [id number] DOB: 04/06/1946 Today's Date: 06/22/2013 Time: 9892-1194 PT Time Calculation (min): 14 min  PT Assessment / Plan / Recommendation History of Present Illness  68 yo male admitted with rectal bleeding. Hx of prostate cancer, with mets to T-L spine, shingles, anxiety.   Clinical Impression  On eval, pt required Min assist for mobility-able to ambulate ~70 feet with walker. Demonstrates general weakness, decreased activity tolerance. Recommend HHPT, 24/7 supervision at discharge.     PT Assessment  Patient needs continued PT services    Follow Up Recommendations  Home health PT;Supervision/Assistance - 24 hour    Does the patient have the potential to tolerate intense rehabilitation      Barriers to Discharge        Equipment Recommendations  None recommended by PT    Recommendations for Other Services     Frequency Min 3X/week    Precautions / Restrictions Precautions Precautions: Fall Restrictions Weight Bearing Restrictions: No   Pertinent Vitals/Pain Bladder-unrated/ "pressure". Pt also has chronic back pain from mets per wife.       Mobility  Bed Mobility Overal bed mobility: Needs Assistance Bed Mobility: Supine to Sit Supine to sit: Supervision Transfers Overall transfer level: Needs assistance Transfers: Sit to/from Stand Sit to Stand: Min assist General transfer comment: assist to rise, stabilize, control descent. vcs safety, hand placement Ambulation/Gait Ambulation/Gait assistance: Min guard Ambulation Distance (Feet): 70 Feet Assistive device: Rolling walker (2 wheeled) Gait Pattern/deviations: Decreased stride length;Trunk flexed General Gait Details: slow gait speed. fatigues fairly easily. 1 short standing rest break durin gwalk.     Exercises     PT Diagnosis: Difficulty walking;Generalized weakness;Acute pain  PT Problem List: Decreased strength;Decreased activity  tolerance;Decreased mobility;Pain;Decreased balance PT Treatment Interventions: DME instruction;Gait training;Functional mobility training;Therapeutic activities;Therapeutic exercise;Patient/family education;Balance training     PT Goals(Current goals can be found in the care plan section) Acute Rehab PT Goals Patient Stated Goal: home soon. improved comfort of life PT Goal Formulation: With patient Time For Goal Achievement: 07/06/13 Potential to Achieve Goals: Good  Visit Information  Last PT Received On: 06/22/13 Assistance Needed: +1 History of Present Illness: 68 yo male admitted with rectal bleeding. Hx of prostate cancer, with mets to T-L spine, shingles, anxiety.        Prior [place] expects to be discharged to:: Private residence Living Arrangements: Spouse/significant other Type of Home: House Home Access: [place] of Steps: [place]: One [place]: Environmental consultant - 2 wheels;Cane - single point;Electric scooter;Wheelchair - [employer];Shower seat Prior Function Level of Independence: Needs assistance Gait / Transfers Assistance Needed: used walker in home ADL's / Homemaking Assistance Needed: sponge bathing for last couple of weeks-wife assisted with this Communication Communication: No difficulties    Cognition  Cognition Arousal/Alertness: Awake/alert Behavior During Therapy: WFL for tasks assessed/performed Overall Cognitive Status: Within Functional Limits for tasks assessed    Extremity/Trunk Assessment Upper Extremity Assessment Upper Extremity Assessment: Generalized weakness Lower Extremity Assessment Lower Extremity Assessment: Generalized weakness Cervical / Trunk Assessment Cervical / Trunk Assessment: Kyphotic   Balance    End of Session PT - End of Session Equipment Utilized During Treatment: Gait belt Activity Tolerance: Patient limited by fatigue Patient  left: in chair;with call bell/phone within reach;with family/visitor present  GP     Weston Anna, MPT Pager: [phone]

## 2013-06-22 NOTE — Progress Notes (Signed)
[facility] PROGRESS NOTE  Jeff Jimenez OXB:[id number] DOB: 1945-07-14 DOA: 06/20/2013 PCP: [name], MD  Brief narrative: 68 y.o. male with history of metastatic prostate cancer status post chemotherapy and radiation with last chemo 2 weeks prior to this admission who was admitted to [name] 06/20/13 with rectal bleeding. Patient has been seen by gastroenterology with recommendations for conservative care.   Assessment/Plan:   Principal Problem:  Rectal bleeding / acute blood loss anemia in a patient with a history of diverticulitis  - Likely diverticular.  - No further bleeds - Will intensify bowel regimen with Colace and MiraLAX twice daily and see if patient has a bowel movement without the bleed Active Problems:  ADENOCARCINOMA, PROSTATE, METASTATIC  - Seen by Dr. Alen Blew 06/21/13. Treatment will resume his outpatient upon discharge.  HYPERTENSION  - Continue atenolol.  Leukocytosis - likely reactive - no fever - no obvious source of infection BPH - continue Flomax daily  DVT Prophylaxis  - Continue SCDs.   Code Status: Full.  Family Communication: Family at the bedside.  Disposition Plan: Home when stable.   IV access:  Port-A-Cath Medical Consultants:  Dr. Alen Blew, Oncology  Dr. Michail Sermon, Gastroenterology Other Consultants:  Physical therapy Anti-infectives:  None   Leisa Lenz, MD  [facility] Pager [phone]  If 7PM-7AM, please contact night-coverage [email or web address] Password [facility] 06/22/2013, 2:29 PM   LOS: 2 days    HPI/Subjective: No further bleed, no BM yet.  Objective: Filed Vitals:   06/21/13 2229 06/21/13 1307 06/21/13 2144 06/22/13 0524  BP: 110/66 113/75 98/60 95/60   Pulse: 79 80 72 64  Temp:  97.6 F (36.4 C) 98 F (36.7 C) 97.4 F (36.3 C)  TempSrc:  Oral Oral Oral  Resp:  18 18 18   Height:      Weight:      SpO2:  98% 96% 92%    Intake/Output Summary (Last 24 hours) at 06/22/13 1429 Last data filed at 06/22/13 1300  Gross per 24 hour  Intake 2287.5 ml  Output    210 ml  Net 2077.5 ml    Exam:   General:  Pt is alert, follows commands appropriately, not in acute distress  Cardiovascular: Regular rate and rhythm, S1/S2, no murmurs, no rubs, no gallops  Respiratory: Clear to auscultation bilaterally, no wheezing, no crackles, no rhonchi  Abdomen: Soft, non tender, non distended, bowel sounds present, no guarding  Extremities: No edema, pulses DP and PT palpable bilaterally  Neuro: Grossly nonfocal  Data Reviewed: Basic Metabolic Panel:  Recent Labs Lab 06/20/13 2039  NA 135*  K 4.0  CL 101  CO2 20  GLUCOSE 132*  BUN 9  CREATININE 0.69  CALCIUM 7.2*   Liver Function Tests:  Recent Labs Lab 06/20/13 2039  AST 21  ALT 7  ALKPHOS 271*  BILITOT 0.3  PROT 5.4*  ALBUMIN 2.4*   No results found for this basename: LIPASE, AMYLASE,  in the last 168 hours No results found for this basename: AMMONIA,  in the last 168 hours CBC:  Recent Labs Lab 06/20/13 2039  06/21/13 1220 06/21/13 1925 06/21/13 2350 06/22/13 0525 06/22/13 1050  WBC 21.0*  < > 20.7* 21.2* 19.3* 16.9* 18.2*  NEUTROABS 18.5*  --   --   --   --   --   --   HGB 6.9*  < > 10.0* 9.9* 9.3* 9.4* 10.0*  HCT 22.2*  < > 31.0* 31.1* 29.4* 29.7* 30.9*  MCV 88.1  < > 84.5 84.7 85.5  86.3 85.6  PLT 263  < > 208 203 179 215 208  < > = values in this interval not displayed. Cardiac Enzymes: No results found for this basename: CKTOTAL, CKMB, CKMBINDEX, TROPONINI,  in the last 168 hours BNP: No components found with this basename: POCBNP,  CBG: No results found for this basename: GLUCAP,  in the last 168 hours  No results found for this or any previous visit (from the past 240 hour(s)).   Studies: Dg Abd Portable 1v 06/21/2013    IMPRESSION: Nonspecific bowel gas pattern without plain film evidence of bowel obstruction.  Diffuse osseous metastatic disease.    Scheduled Meds: . atenolol  75 mg Oral QPM  .  calcium-vitamin D  2 tablet Oral Daily  . diphenhydrAMINE  25 mg Oral BID  . docusate sodium  100 mg Oral BID  . OxyCODONE  120 mg Oral TID  . polyethylene glycol  17 g Oral BID  . predniSONE  5 mg Oral QODAY  . tamsulosin  0.4 mg Oral Daily   Continuous Infusions: . sodium chloride 1,000 mL (06/21/13 0630)

## 2013-06-22 NOTE — Progress Notes (Signed)
UR completed. Lawonda Pretlow RN CCM Case Mgmt phone [phone] 

## 2013-06-23 DIAGNOSIS — K625 Hemorrhage of anus and rectum: Secondary | ICD-10-CM

## 2013-06-23 DIAGNOSIS — R7309 Other abnormal glucose: Secondary | ICD-10-CM

## 2013-06-23 LAB — CBC
HCT: 30.2 % — ABNORMAL LOW (ref 39.0–52.0)
HCT: 30.2 % — ABNORMAL LOW (ref 39.0–52.0)
HEMATOCRIT: 31.8 % — AB (ref 39.0–52.0)
HEMOGLOBIN: 9.2 g/dL — AB (ref 13.0–17.0)
Hemoglobin: 9.7 g/dL — ABNORMAL LOW (ref 13.0–17.0)
Hemoglobin: 10 g/dL — ABNORMAL LOW (ref 13.0–17.0)
MCH: 27.7 pg (ref 26.0–34.0)
MCH: 26.5 pg (ref 26.0–34.0)
MCH: 27.3 pg (ref 26.0–34.0)
MCHC: 32.1 g/dL (ref 30.0–36.0)
MCHC: 30.5 g/dL (ref 30.0–36.0)
MCHC: 31.4 g/dL (ref 30.0–36.0)
MCV: 86.3 fL (ref 78.0–100.0)
MCV: 87 fL (ref 78.0–100.0)
MCV: 86.9 fL (ref 78.0–100.0)
PLATELETS: 205 10*3/uL (ref 150–400)
Platelets: 217 10*3/uL (ref 150–400)
Platelets: 209 10*3/uL (ref 150–400)
RBC: 3.5 MIL/uL — ABNORMAL LOW (ref 4.22–5.81)
RBC: 3.47 MIL/uL — AB (ref 4.22–5.81)
RBC: 3.66 MIL/uL — AB (ref 4.22–5.81)
RDW: 19.4 % — AB (ref 11.5–15.5)
RDW: 19.3 % — ABNORMAL HIGH (ref 11.5–15.5)
RDW: 19.3 % — ABNORMAL HIGH (ref 11.5–15.5)
WBC: 19.1 10*3/uL — AB (ref 4.0–10.5)
WBC: 16.8 10*3/uL — ABNORMAL HIGH (ref 4.0–10.5)
WBC: 20.2 10*3/uL — AB (ref 4.0–10.5)

## 2013-06-23 MED ORDER — HEPARIN SOD (PORK) LOCK FLUSH 100 UNIT/ML IV SOLN
500.0000 [IU] | INTRAVENOUS | Status: AC | PRN
  Administered 2013-06-23: 500 [IU]

## 2013-06-23 NOTE — Discharge Summary (Signed)
Physician Discharge Summary  Jeff Jimenez QMV:[id number] DOB: 1946/01/12 DOA: 06/20/2013  PCP: [name], MD  Admit date: 06/20/2013 Discharge date: 06/23/2013  Time spent: 40 minutes  Recommendations for Outpatient Follow-up:  1. Follow up with PCP in 1-2 weeks 2. Follow up with GI as needed  Discharge Diagnoses:  Principal Problem:   Rectal bleeding Active Problems:   ADENOCARCINOMA, PROSTATE, METASTATIC   HYPERTENSION   DIVERTICULITIS, HX OF   Acute blood loss anemia   Discharge Condition: Stable  Diet recommendation: Regular  Filed Weights   06/20/13 1937 06/21/13 0015  Weight: 63.504 kg (140 lb) 63.277 kg (139 lb 8 oz)    History of present illness:  68 year old male with history of metastatic prostate cancer status post chemotherapy and radiation (last chemotherapy 2 weeks back ) presented to the ED with bright red blood per rectum for 2 days. Patient reports that he has been having off-and-on blood per rectum for last 2 months which have not been very severe and his hemoglobin been fairly stable. Since yesterday he has had 3-4 episodes of large amount of bright red blood per rectum with clots. The bleeding had been painless. He reports feeling tired but denies any dizziness or near syncopal episode. He reports having some lower abdominal pain. His last bowel movement was this afternoon. He denies any history of hemorrhoids a previous history of GI bleed. Denies use of NSAIDs.. Patient denies headache, dizziness, fever, chills, nausea , vomiting, chest pain, palpitations, SOB, , bowel or urinary symptoms. Denies change in weight or appetite.  Hospital Course:  Rectal bleeding / acute blood loss anemia in a patient with a history of diverticulitis  - Likely diverticular.  - No further bleeds  - GI recs for follow up as an outpatient  ADENOCARCINOMA, PROSTATE, METASTATIC  - Seen by Dr. Alen Blew 06/21/13. Treatment will resume his outpatient upon discharge.  HYPERTENSION  -  Continue atenolol.  Leukocytosis  - likely reactive  - no fever  - no obvious source of infection  - Resolving BPH - continue Flomax daily  DVT Prophylaxis  - Continue SCDs.   Consultations:  GI  Discharge Exam: Filed Vitals:   06/22/13 0524 06/22/13 1434 06/22/13 2154 06/23/13 0558  BP: 95/60 98/65 101/67 97/67  Pulse: 64 70 70 73  Temp: 97.4 F (36.3 C) 98 F (36.7 C) 98.4 F (36.9 C) 98.8 F (37.1 C)  TempSrc: Oral Oral Oral Oral  Resp: 18 16 18 16   Height:      Weight:      SpO2: 92% 95% 97% 96%    General: Awake, in nad Cardiovascular: Regular, s1, s2 Respiratory: normal resp effort, no wheezing  Discharge Instructions       Future Appointments Provider Department Dept Phone   07/09/2013 9:15 AM [facility] [phone]   07/09/2013 9:45 AM Maryanna Shape, NP [facility] [phone]   07/30/2013 10:00 AM Rowe Clack, MD [facility] [phone]       Medication List         atenolol 50 MG tablet  Commonly known as:  TENORMIN  Take 1.5 tablets (75 mg total) by mouth every evening.     CALCIUM 600+D 600-400 MG-UNIT per tablet  Generic drug:  Calcium Carbonate-Vitamin D  Take 2 tablets by mouth daily.     diphenhydrAMINE 25 MG tablet  Commonly known as:  SOMINEX  Take 25 mg by  mouth 2 (two) times daily.     docusate sodium 100 MG capsule  Commonly known as:  COLACE  Take 100 mg by mouth at bedtime as needed for mild constipation.     FLOMAX 0.4 MG Caps capsule  Generic drug:  tamsulosin  Take 0.4 mg by mouth daily.     HYDROmorphone 4 MG tablet  Commonly known as:  DILAUDID  Take 1 tablet (4 mg total) by mouth every 4 (four) hours as needed for severe pain.     lidocaine-prilocaine cream  Commonly known as:  EMLA  Apply topically as needed. Apply approximately 1/2 teaspoon to skin over port-a-cath. Do not rub in. Place clear  plastic over cream. 1-1 and 1/2 hours prior to chemotherapy treatment.     LUPRON DEPOT 22.5 MG injection  Generic drug:  leuprolide  Inject 22.5 mg into the muscle every 6 (six) months. Or as directed by urology     NEULASTA 6 MG/0.6ML injection  Generic drug:  pegfilgrastim  Inject 6 mg into the skin once.     ondansetron 8 MG tablet  Commonly known as:  ZOFRAN  Take 8 mg by mouth every 8 (eight) hours as needed for nausea or vomiting.     OxyCODONE HCl ER 60 MG T12a  Take two tablets three times a day.     polyethylene glycol packet  Commonly known as:  MIRALAX / GLYCOLAX  Take 17 g by mouth daily.     predniSONE 5 MG tablet  Commonly known as:  DELTASONE  Take 5 mg by mouth every other day.     prochlorperazine 10 MG tablet  Commonly known as:  COMPAZINE  Take 10 mg by mouth every 6 (six) hours as needed for nausea or vomiting.     XGEVA 120 MG/1.7ML Soln injection  Generic drug:  denosumab  Inject 120 mg into the skin every 30 (thirty) days. Patient receives injection in drs office       No Known Allergies Follow-up Information   Follow up with [name], MD In 1 week.   Specialty:  Internal Medicine   Contact information:   [address] [place] [address] [phone]       Follow up with [facility], MD. (as scheduled)    Specialty:  Oncology   Contact information:   [place]. [place] [address] [phone]        The results of significant diagnostics from this hospitalization (including imaging, microbiology, ancillary and laboratory) are listed below for reference.    Significant Diagnostic Studies: Dg Abd Portable 1v  06/21/2013   CLINICAL DATA:  Rectal bleeding. Mid abdominal pain. History of prostate cancer.  EXAM: PORTABLE ABDOMEN - 1 VIEW  COMPARISON:  10/29/2012 CT.  FINDINGS: Diffuse osseous metastatic disease with mild loss of height of vertebra.  Nonspecific bowel gas pattern without plain  film evidence of bowel obstruction.  The possibility of free intraperitoneal air cannot be assessed on a supine view.  IMPRESSION: Nonspecific bowel gas pattern without plain film evidence of bowel obstruction.  Diffuse osseous metastatic disease.  Please see above.   Electronically Signed   By: Chauncey Cruel M.D.   On: 06/21/2013 00:24    Microbiology: No results found for this or any previous visit (from the past 240 hour(s)).   Labs: Basic Metabolic Panel:  Recent Labs Lab 06/20/13 2039  NA 135*  K 4.0  CL 101  CO2 20  GLUCOSE 132*  BUN 9  CREATININE 0.69  CALCIUM 7.2*   Liver Function Tests:  Recent Labs Lab 06/20/13 2039  AST 21  ALT 7  ALKPHOS 271*  BILITOT 0.3  PROT 5.4*  ALBUMIN 2.4*   No results found for this basename: LIPASE, AMYLASE,  in the last 168 hours No results found for this basename: AMMONIA,  in the last 168 hours CBC:  Recent Labs Lab 06/20/13 2039  06/22/13 1050 06/22/13 1650 06/22/13 2300 06/23/13 0535 06/23/13 1110  WBC 21.0*  < > 18.2* 18.0* 19.1* 16.8* 20.2*  NEUTROABS 18.5*  --   --   --   --   --   --   HGB 6.9*  < > 10.0* 9.8* 9.7* 9.2* 10.0*  HCT 22.2*  < > 30.9* 30.7* 30.2* 30.2* 31.8*  MCV 88.1  < > 85.6 85.8 86.3 87.0 86.9  PLT 263  < > 208 221 217 205 209  < > = values in this interval not displayed. Cardiac Enzymes: No results found for this basename: CKTOTAL, CKMB, CKMBINDEX, TROPONINI,  in the last 168 hours BNP: BNP (last 3 results) No results found for this basename: PROBNP,  in the last 8760 hours CBG: No results found for this basename: GLUCAP,  in the last 168 hours   Signed:  CHIU, STEPHEN K  [facility] 06/23/2013, 12:57 PM

## 2013-06-23 NOTE — Progress Notes (Signed)
Clinical Social Work Department BRIEF PSYCHOSOCIAL ASSESSMENT 06/23/2013  Patient:  Jeff Jimenez,Jeff Jimenez     Account Number:  [id number]     Admit date:  06/20/2013  Clinical Social Worker:  GERBER,HOLLY, LCSW  Date/Time:  06/23/2013 10:30 AM  Referred by:  Physician  Date Referred:  06/23/2013 Referred for  SNF Placement   Other Referral:   Interview type:  Patient Other interview type:    PSYCHOSOCIAL DATA Living Status:  FAMILY Admitted from facility:   Level of care:   Primary support name:  Georgette Primary support relationship to patient:  SPOUSE Degree of support available:   Strong    CURRENT CONCERNS Current Concerns  Post-Acute Placement   Other Concerns:    SOCIAL WORK ASSESSMENT / PLAN CSW received referral to assist with DC planning. CSW reviewed chart which stated that PT recommends HH vs 24 hour supervision. CSW met with patient and wife at bedside and introduced myself and explained role.    Patient reports that he and wife have been married for 44 years and that wife is at home with him all day. Patient reports that he is ready to DC and to get back home. CSW spoke with patient about SNF placement if 24 hour supervision is not available. Wife reports that she has been patient's "personal assistant" for awhile and that she will continue to care for patient. CSW explained SNF in further detail and explained that patient could go for ST if needed. Wife reports that patient will not do well at SNF and will feel emotionally better at home. Patient has never had HH in the past but is agreeable to talk with CM about options.    Patient reports no CSW needs at this time. CSW is signing off but available if needed. CM aware of HH needs.   Assessment/plan status:  No Further Intervention Required Other assessment/ plan:   Information/referral to community resources:   SNF information  Referral to CM    PATIENT'S/FAMILY'S RESPONSE TO PLAN OF CARE: Patient alert and engaged  in assessment. Patient and wife spoke about their relationship and how wife is able to provide support. Patient is anxious about hospital stay and wants to DC home. Patient reports that wife provides excellent care ands to be with her. Wife acknowledges that it is difficult to provide all of the care for patient she wants to do it because he is her husband. Wife reports she can manage patient and no safety concerns with returning home.       Holly Gerber, LCSW [phone] 

## 2013-06-23 NOTE — Care Management Note (Unsigned)
    Page 1 of 1   06/23/2013     11:37:12 AM   CARE MANAGEMENT NOTE 06/23/2013  Patient:  RONZELL, LABAN   Account Number:  [id number]  Date Initiated:  06/21/2013  Documentation initiated by:  [facility]  Subjective/Objective Assessment:   GI Bleed, prostate cancer, diverticulitis     Action/Plan:   lives at home with wife   Anticipated DC Date:  06/25/2013   Anticipated DC Plan:  [facility]  CM consult      Choice offered to / List presented to:             [employer].   Status of service:  In process, will continue to follow Medicare Important Message given?  NA - LOS <3 / Initial given by admissions (If response is "NO", the following Medicare IM given date fields will be blank) Date Medicare IM given:   Date Additional Medicare IM given:    Discharge Disposition:    Per UR Regulation:  Reviewed for med. necessity/level of care/duration of stay  If discussed at [place] of Stay Meetings, dates discussed:    Comments:  06/23/13 Allene Dillon RN BSN Met with pt and spouse at bedside to discuss d/c planning. PT isrecommednign HHPT and pt has chosen [facility] to provide the services. Referral has been made but MD needs to order the services.

## 2013-06-23 NOTE — Progress Notes (Signed)
Discharge instructions given to pt/wife, verbalized understanding. Left the unit in stable condition. 

## 2013-06-23 NOTE — Progress Notes (Signed)
[name] 8:50 AM  Subjective: Patient without signs of bleeding but has not moved his bowel and still wants to go home and no new complaints  Objective: Vital signs stable afebrile no acute distress abdomen is soft nontender hemoglobin slight decrease white count is still elevated  Assessment: Seemingly resolve GI bleeding probably diverticular  Plan: Okay with me to go home and see yesterday's note for recommendation  [facility] E

## 2013-06-25 ENCOUNTER — Other Ambulatory Visit: Payer: Self-pay | Admitting: Radiation Oncology

## 2013-06-25 DIAGNOSIS — C61 Malignant neoplasm of prostate: Secondary | ICD-10-CM

## 2013-06-28 ENCOUNTER — Telehealth: Payer: Self-pay | Admitting: *Deleted

## 2013-06-28 NOTE — Telephone Encounter (Signed)
CALLED PATIENT TO INFORM OF INJECTION , LABS AND WEIGHT, SPOKE WITH PATIENT AND HE IS AWARE OF THESE APPTS.

## 2013-06-29 NOTE — ED Provider Notes (Signed)
Medical screening examination/treatment/procedure(s) were performed by non-physician practitioner and as supervising physician I was immediately available for consultation/collaboration.   EKG Interpretation None        Tanna Furry, MD 06/29/13 [phone]

## 2013-06-30 ENCOUNTER — Emergency Department (HOSPITAL_COMMUNITY): Payer: Medicare Other

## 2013-06-30 ENCOUNTER — Inpatient Hospital Stay (HOSPITAL_COMMUNITY)
Admission: EM | Admit: 2013-06-30 | Discharge: 2013-07-01 | DRG: 085 | Disposition: A | Discharge status: Hospice | Payer: Medicare Other | Attending: Internal Medicine | Admitting: Internal Medicine

## 2013-06-30 ENCOUNTER — Encounter (HOSPITAL_COMMUNITY): Payer: Self-pay | Admitting: Emergency Medicine

## 2013-06-30 ENCOUNTER — Ambulatory Visit: Payer: Medicare Other | Admitting: Internal Medicine

## 2013-06-30 DIAGNOSIS — S065XAA Traumatic subdural hemorrhage with loss of consciousness status unknown, initial encounter: Principal | ICD-10-CM | POA: Diagnosis present

## 2013-06-30 DIAGNOSIS — C800 Disseminated malignant neoplasm, unspecified: Secondary | ICD-10-CM

## 2013-06-30 DIAGNOSIS — C7951 Secondary malignant neoplasm of bone: Secondary | ICD-10-CM | POA: Diagnosis present

## 2013-06-30 DIAGNOSIS — Z923 Personal history of irradiation: Secondary | ICD-10-CM

## 2013-06-30 DIAGNOSIS — R404 Transient alteration of awareness: Secondary | ICD-10-CM

## 2013-06-30 DIAGNOSIS — R4182 Altered mental status, unspecified: Secondary | ICD-10-CM | POA: Diagnosis present

## 2013-06-30 DIAGNOSIS — S065X9A Traumatic subdural hemorrhage with loss of consciousness of unspecified duration, initial encounter: Secondary | ICD-10-CM

## 2013-06-30 DIAGNOSIS — Z8249 Family history of ischemic heart disease and other diseases of the circulatory system: Secondary | ICD-10-CM

## 2013-06-30 DIAGNOSIS — R4020 Unspecified coma: Secondary | ICD-10-CM | POA: Diagnosis present

## 2013-06-30 DIAGNOSIS — N400 Enlarged prostate without lower urinary tract symptoms: Secondary | ICD-10-CM | POA: Diagnosis present

## 2013-06-30 DIAGNOSIS — J9600 Acute respiratory failure, unspecified whether with hypoxia or hypercapnia: Secondary | ICD-10-CM

## 2013-06-30 DIAGNOSIS — N390 Urinary tract infection, site not specified: Secondary | ICD-10-CM

## 2013-06-30 DIAGNOSIS — F1120 Opioid dependence, uncomplicated: Secondary | ICD-10-CM | POA: Diagnosis present

## 2013-06-30 DIAGNOSIS — R5381 Other malaise: Secondary | ICD-10-CM | POA: Diagnosis present

## 2013-06-30 DIAGNOSIS — I10 Essential (primary) hypertension: Secondary | ICD-10-CM | POA: Diagnosis present

## 2013-06-30 DIAGNOSIS — I6200 Nontraumatic subdural hemorrhage, unspecified: Secondary | ICD-10-CM | POA: Diagnosis present

## 2013-06-30 DIAGNOSIS — R0989 Other specified symptoms and signs involving the circulatory and respiratory systems: Secondary | ICD-10-CM

## 2013-06-30 DIAGNOSIS — C7952 Secondary malignant neoplasm of bone marrow: Secondary | ICD-10-CM

## 2013-06-30 DIAGNOSIS — F411 Generalized anxiety disorder: Secondary | ICD-10-CM | POA: Diagnosis present

## 2013-06-30 DIAGNOSIS — W19XXXA Unspecified fall, initial encounter: Secondary | ICD-10-CM | POA: Diagnosis present

## 2013-06-30 DIAGNOSIS — Z66 Do not resuscitate: Secondary | ICD-10-CM | POA: Diagnosis present

## 2013-06-30 DIAGNOSIS — R5383 Other fatigue: Secondary | ICD-10-CM

## 2013-06-30 DIAGNOSIS — J90 Pleural effusion, not elsewhere classified: Secondary | ICD-10-CM

## 2013-06-30 DIAGNOSIS — Z7401 Bed confinement status: Secondary | ICD-10-CM

## 2013-06-30 DIAGNOSIS — Z515 Encounter for palliative care: Secondary | ICD-10-CM

## 2013-06-30 DIAGNOSIS — Z79899 Other long term (current) drug therapy: Secondary | ICD-10-CM

## 2013-06-30 DIAGNOSIS — C61 Malignant neoplasm of prostate: Secondary | ICD-10-CM

## 2013-06-30 LAB — COMPREHENSIVE METABOLIC PANEL
ALK PHOS: 278 U/L — AB (ref 39–117)
ALT: 10 U/L (ref 0–53)
AST: 28 U/L (ref 0–37)
Albumin: 2.2 g/dL — ABNORMAL LOW (ref 3.5–5.2)
BILIRUBIN TOTAL: 0.4 mg/dL (ref 0.3–1.2)
BUN: 9 mg/dL (ref 6–23)
CHLORIDE: 99 meq/L (ref 96–112)
CO2: 23 mEq/L (ref 19–32)
Calcium: 7.3 mg/dL — ABNORMAL LOW (ref 8.4–10.5)
Creatinine, Ser: 0.39 mg/dL — ABNORMAL LOW (ref 0.50–1.35)
GLUCOSE: 123 mg/dL — AB (ref 70–99)
Potassium: 4.1 mEq/L (ref 3.7–5.3)
Sodium: 135 mEq/L — ABNORMAL LOW (ref 137–147)
TOTAL PROTEIN: 5.4 g/dL — AB (ref 6.0–8.3)

## 2013-06-30 LAB — URINALYSIS, ROUTINE W REFLEX MICROSCOPIC
Bilirubin Urine: NEGATIVE
Glucose, UA: NEGATIVE mg/dL
Ketones, ur: NEGATIVE mg/dL
NITRITE: POSITIVE — AB
PROTEIN: NEGATIVE mg/dL
Specific Gravity, Urine: 1.018 (ref 1.005–1.030)
Urobilinogen, UA: 0.2 mg/dL (ref 0.0–1.0)
pH: 6 (ref 5.0–8.0)

## 2013-06-30 LAB — CBC WITH DIFFERENTIAL/PLATELET
Basophils Absolute: 0 10*3/uL (ref 0.0–0.1)
Basophils Relative: 0 % (ref 0–1)
EOS ABS: 0 10*3/uL (ref 0.0–0.7)
EOS PCT: 0 % (ref 0–5)
HEMATOCRIT: 30.5 % — AB (ref 39.0–52.0)
Hemoglobin: 9.5 g/dL — ABNORMAL LOW (ref 13.0–17.0)
Lymphocytes Relative: 7 % — ABNORMAL LOW (ref 12–46)
Lymphs Abs: 1 10*3/uL (ref 0.7–4.0)
MCH: 27.1 pg (ref 26.0–34.0)
MCHC: 31.1 g/dL (ref 30.0–36.0)
MCV: 86.9 fL (ref 78.0–100.0)
Monocytes Absolute: 0.7 10*3/uL (ref 0.1–1.0)
Monocytes Relative: 5 % (ref 3–12)
NEUTROS ABS: 12.7 10*3/uL — AB (ref 1.7–7.7)
Neutrophils Relative %: 88 % — ABNORMAL HIGH (ref 43–77)
Platelets: 198 10*3/uL (ref 150–400)
RBC: 3.51 MIL/uL — AB (ref 4.22–5.81)
RDW: 18.4 % — ABNORMAL HIGH (ref 11.5–15.5)
WBC: 14.4 10*3/uL — ABNORMAL HIGH (ref 4.0–10.5)

## 2013-06-30 LAB — URINE MICROSCOPIC-ADD ON

## 2013-06-30 LAB — LIPASE, BLOOD: LIPASE: 7 U/L — AB (ref 11–59)

## 2013-06-30 LAB — TROPONIN I: Troponin I: <0.3 ng/mL (ref <0.30)

## 2013-06-30 MED ORDER — DENOSUMAB 120 MG/1.7ML ~~LOC~~ SOLN: 120.0000 mg | SUBCUTANEOUS | Status: DC

## 2013-06-30 MED ORDER — LEUPROLIDE ACETATE (3 MONTH) 22.5 MG IM KIT: 22.5000 mg | PACK | INTRAMUSCULAR | Status: DC

## 2013-06-30 MED ORDER — POLYETHYLENE GLYCOL 3350 17 G PO PACK
17.0000 g | PACK | Freq: Every day | ORAL | Status: DC | PRN
  Filled 2013-06-30: qty 1

## 2013-06-30 MED ORDER — LORAZEPAM 2 MG/ML IJ SOLN
1.0000 mg | Freq: Once | INTRAMUSCULAR | Status: AC
  Administered 2013-06-30: 1 mg via INTRAVENOUS
  Filled 2013-06-30: qty 1

## 2013-06-30 MED ORDER — ONDANSETRON HCL 4 MG/2ML IJ SOLN: 4.0000 mg | Freq: Four times a day (QID) | INTRAMUSCULAR | Status: DC | PRN

## 2013-06-30 MED ORDER — ATENOLOL 50 MG PO TABS
50.0000 mg | ORAL_TABLET | Freq: Every day | ORAL | Status: DC
  Filled 2013-06-30 (×2): qty 1

## 2013-06-30 MED ORDER — SODIUM CHLORIDE 0.9 % IV SOLN
Freq: Once | INTRAVENOUS | Status: AC
  Administered 2013-06-30: 13:00:00 via INTRAVENOUS

## 2013-06-30 MED ORDER — ATROPINE SULFATE 1 % OP SOLN
2.0000 [drp] | Freq: Four times a day (QID) | OPHTHALMIC | Status: DC | PRN
  Administered 2013-06-30: 2 [drp] via SUBLINGUAL
  Filled 2013-06-30: qty 2

## 2013-06-30 MED ORDER — POLYETHYLENE GLYCOL 3350 17 G PO PACK
17.0000 g | PACK | Freq: Every day | ORAL | Status: DC
  Filled 2013-06-30 (×2): qty 1

## 2013-06-30 MED ORDER — TAMSULOSIN HCL 0.4 MG PO CAPS
0.4000 mg | ORAL_CAPSULE | Freq: Every day | ORAL | Status: DC
  Filled 2013-06-30 (×2): qty 1

## 2013-06-30 MED ORDER — LIDOCAINE-PRILOCAINE 2.5-2.5 % EX CREA: TOPICAL_CREAM | CUTANEOUS | Status: DC | PRN

## 2013-06-30 MED ORDER — DEXTROSE 5 % IV SOLN
1.0000 g | INTRAVENOUS | Status: DC
  Filled 2013-06-30: qty 10

## 2013-06-30 MED ORDER — GUAIFENESIN-DM 100-10 MG/5ML PO SYRP: 5.0000 mL | ORAL_SOLUTION | ORAL | Status: DC | PRN

## 2013-06-30 MED ORDER — PREDNISONE 5 MG PO TABS
5.0000 mg | ORAL_TABLET | ORAL | Status: DC
  Filled 2013-06-30: qty 1

## 2013-06-30 MED ORDER — DEXTROSE 5 % IV SOLN
1.0000 g | Freq: Once | INTRAVENOUS | Status: AC
  Administered 2013-06-30: 1 g via INTRAVENOUS
  Filled 2013-06-30: qty 10

## 2013-06-30 MED ORDER — MORPHINE SULFATE 2 MG/ML IJ SOLN
2.0000 mg | INTRAMUSCULAR | Status: DC | PRN
  Administered 2013-06-30: 2 mg via INTRAVENOUS
  Filled 2013-06-30: qty 1

## 2013-06-30 MED ORDER — PROCHLORPERAZINE MALEATE 10 MG PO TABS: 10.0000 mg | ORAL_TABLET | Freq: Four times a day (QID) | ORAL | Status: DC | PRN

## 2013-06-30 MED ORDER — DOCUSATE SODIUM 100 MG PO CAPS
100.0000 mg | ORAL_CAPSULE | Freq: Every evening | ORAL | Status: DC | PRN
  Filled 2013-06-30: qty 1

## 2013-06-30 MED ORDER — ONDANSETRON HCL 4 MG PO TABS: 4.0000 mg | ORAL_TABLET | Freq: Four times a day (QID) | ORAL | Status: DC | PRN

## 2013-06-30 MED ORDER — SODIUM CHLORIDE 0.9 % IV SOLN
INTRAVENOUS | Status: DC
  Administered 2013-06-30 – 2013-07-01 (×2): via INTRAVENOUS

## 2013-06-30 NOTE — Progress Notes (Signed)
Patient arrived on unit via  Stretcher. He was unresponsive, pale in color. Family came with patient. Patient has a skin tear on his right elbow, due to patient falling at home.

## 2013-06-30 NOTE — ED Notes (Signed)
Per EMSpt coming from home with c/o increased generalized weakness and fall. Per EMS pt has hx of prostate cancer, had 2 rounds of chenotherapy and is currently getting radiation. Per EMS wife reports getting pt ready this am to bring him to hospital d/t weakness when pt fell straight back; denies LOC, or pain. Per EMS pt was orthostatic on their arrival

## 2013-06-30 NOTE — Progress Notes (Signed)
Contacted Dr. Osker Mason, he recommended full comfort care.  He also spoke with the wife.  I spoke with her again.  After discussion, decided to make patient a full NCB, no surgery and will admit and treat the UTI only.  If patient deteriorates then transition to full comfort care.  The MRI would be of no utility at this point and will not be ordered.  TRH to admit and manage, PCCM will sign off, please call back if needed.  Additional CC time of 30 min.  Rush Farmer, M.D. [facility] Pulmonary/Critical Care Medicine. Pager: [phone]. After hours pager: [phone].

## 2013-06-30 NOTE — ED Provider Notes (Signed)
Wife reports her husband was admitted recently for a GI bleed. She states she started noticing yesterday that he was having some forgetfulness and he seemed to be weaker. She states today she was getting him up in his knees buckled and he just fell straight down. She denies him hitting his head.    Patient's noted to be sleepy but arousable. He knows [facility] is at [facility]. However patient quickly goes back to sleep. Patient's color is very pale.  Medical screening examination/treatment/procedure(s) were conducted as a shared visit with non-physician practitioner(s) and myself.  I personally evaluated the patient during the encounter.   EKG Interpretation None       Rolland Porter, MD, [facility]   Janice Norrie, MD 06/30/13 1406

## 2013-06-30 NOTE — H&P (Signed)
PULMONARY / CRITICAL CARE MEDICINE   Name: Jeff Jimenez MRN: [id number] DOB: 04-21-1946    ADMISSION DATE:  06/30/2013 CONSULTATION DATE:  06/30/2013  REFERRING MD :  EDP PRIMARY SERVICE: PCCM  CHIEF COMPLAINT:  AMS and fall.  BRIEF PATIENT DESCRIPTION: 68 year old male with stage 4 prostate cancer who has been having a very poor quality of life prior and was essentially bed ridden.  Patient got up and fell today, no head trauma but was noted to have altered mental status and patient was brought to the ED.  In the ED was noted to have a SDH on the left with midline shift.  NS was called and requested patient be transferred to [facility] after an MRI is done that shows to metastatic disease.  SIGNIFICANT EVENTS / STUDIES:  3/11 L SDH and AMS  LINES / TUBES: PIV  CULTURES: Urine 3/11>>> Blood 3/11>>> Sputum 3/11>>>  ANTIBIOTICS: Vancomycin 3/11>>> Zosyn 3/11>>>  HISTORY OF PRESENT ILLNESS:  68 year old male with stage 4 prostate cancer who has been having a very poor quality of life prior and was essentially bed ridden.  Patient got up and fell today, no head trauma but was noted to have altered mental status and patient was brought to the ED.  In the ED was noted to have a SDH on the left with midline shift.  NS was called and requested patient be transferred to [facility] after an MRI is done that shows to metastatic disease.  PAST MEDICAL HISTORY :  Past Medical History  Diagnosis Date  . Shingles 06/2009  . BPH (benign prostatic hyperplasia)   . DIVERTICULITIS, HX OF 2010  . ANEMIA-NOS   . ANXIETY   . HYPERTENSION   . 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 Paraphimosis     hx  . Thrombocytopenia     hx  . Anxiety   . Hx of radiation therapy 03/01/13-03/22/13    lumbar spine 237.5Gy/23fx   Past Surgical History  Procedure Laterality Date  . Left ankle surgery  1968  . Vasectomy  1978   Prior to  Admission medications   Medication Sig Start Date End Date Taking? Authorizing Provider  atenolol (TENORMIN) 50 MG tablet Take 50 mg by mouth daily.   Yes Historical Provider, MD  Calcium Carbonate-Vitamin D (CALCIUM 600+D) 600-400 MG-UNIT per tablet Take 2 tablets by mouth daily.    Yes Historical Provider, MD  denosumab (XGEVA) 120 MG/1.7ML SOLN Inject 120 mg into the skin every 30 (thirty) days. Patient receives injection in drs office   Yes Historical Provider, MD  diphenhydrAMINE (SOMINEX) 25 MG tablet Take 25 mg by mouth 2 (two) times daily.   Yes Historical Provider, MD  docusate sodium (COLACE) 100 MG capsule Take 100 mg by mouth at bedtime as needed for mild constipation.   Yes Historical Provider, MD  HYDROmorphone (DILAUDID) 4 MG tablet Take 1 tablet (4 mg total) by mouth every 4 (four) hours as needed for severe pain. 06/09/13  Yes Wyatt Portela, MD  leuprolide (LUPRON DEPOT) 22.5 MG injection Inject 22.5 mg into the muscle every 6 (six) months. Or as directed by urology 01/23/12  Yes Rowe Clack, MD  lidocaine-prilocaine (EMLA) cream Apply topically as needed. Apply approximately 1/2 teaspoon to skin over port-a-cath. Do not rub in. Place clear plastic over cream. 1-1 and 1/2 hours prior to chemotherapy treatment. 12/03/12  Yes Maryanna Shape, NP  ondansetron (ZOFRAN) 8 MG tablet Take 8 mg by mouth every 8 (eight) hours as needed for nausea or vomiting.   Yes Historical Provider, MD  OxyCODONE HCl ER (OXYCONTIN) 60 MG T12A Take 60 mg by mouth 3 (three) times daily.   Yes Historical Provider, MD  pegfilgrastim (NEULASTA) 6 MG/0.6ML injection Inject 6 mg into the skin once.   Yes Historical Provider, MD  polyethylene glycol (MIRALAX / GLYCOLAX) packet Take 17 g by mouth daily.   Yes Historical Provider, MD  predniSONE (DELTASONE) 5 MG tablet Take 5 mg by mouth every other day.   Yes Historical Provider, MD  PRESCRIPTION MEDICATION cabazitaxel (JEVTANA) 43 mg in sodium chloride 0.9 %  250 mL chemo infusion 43 mg  Once 06/09/2013  [place]   Yes Historical Provider, MD  prochlorperazine (COMPAZINE) 10 MG tablet Take 10 mg by mouth every 6 (six) hours as needed for nausea or vomiting.   Yes Historical Provider, MD  Tamsulosin HCl (FLOMAX) 0.4 MG CAPS Take 0.4 mg by mouth daily.    Yes Historical Provider, MD   No Known Allergies  FAMILY HISTORY:  Family History  Problem Relation Age of Onset  . Hypertension Mother   . Glaucoma Mother   . Heart disease Father   . Hypertension Father    SOCIAL HISTORY:  reports that he has never smoked. He has never used smokeless tobacco. He reports that he does not drink alcohol or use illicit drugs.  REVIEW OF SYSTEMS:  Unable to complete as pt is altered.    SUBJECTIVE:   VITAL SIGNS: Temp:  [98.1 F (36.7 C)-100.1 F (37.8 C)] 100.1 F (37.8 C) (03/11 1223) Pulse Rate:  [65-66] 66 (03/11 1210) Resp:  [14] 14 (03/11 1210) BP: (110-112)/(62-69) 112/69 mmHg (03/11 1210) SpO2:  [88 %-96 %] 94 % (03/11 1210)  HEMODYNAMICS:   VENTILATOR SETTINGS:   INTAKE / OUTPUT: Intake/Output   None    PHYSICAL EXAMINATION: General:  Chronically ill appearing male, AMS. Neuro:  Rigth pupil 4 mm and non-reactive and 3 mm and reactive, not following any command. HEENT:  [place]/AT, pupils as above, no EOM and DMM. Cardiovascular:  RRR, Nl S1/S2, -M/R/G. Lungs:  Decrease BS R>L and bilateral dullness to percussion, end exp wheezes. Abdomen:  Soft, NT, ND and +BS. Musculoskeletal:  1+ edema and -tenderness. Skin:  Intact  LABS:  CBC  Recent Labs Lab 06/30/13 1251  WBC 14.4*  HGB 9.5*  HCT 30.5*  PLT 198   Coag's No results found for this basename: APTT, INR,  in the last 168 hours BMET  Recent Labs Lab 06/30/13 1251  NA 135*  K 4.1  CL 99  CO2 23  BUN 9  CREATININE 0.39*  GLUCOSE 123*   Electrolytes  Recent Labs Lab 06/30/13 1251  CALCIUM 7.3*   Sepsis Markers No results found for this basename: LATICACIDVEN,  PROCALCITON, O2SATVEN,  in the last 168 hours ABG No results found for this basename: PHART, PCO2ART, PO2ART,  in the last 168 hours Liver Enzymes  Recent Labs Lab 06/30/13 1251  AST 28  ALT 10  ALKPHOS 278*  BILITOT 0.4  ALBUMIN 2.2*   Cardiac Enzymes  Recent Labs Lab 06/30/13 1251  TROPONINI <0.30   Glucose No results found for this basename: GLUCAP,  in the last 168 hours  Imaging Dg Chest 2 View  06/30/2013   CLINICAL DATA Fall, weakness, shortness of breath, hypertension, metastatic prostate cancer  EXAM CHEST  2 VIEW  COMPARISON 09/08/2012  FINDINGS Right jugular Port-A-Cath tip projects over SVC.  Rotated to the right.  Upper normal heart size.  Pulmonary vascular congestion.  Bibasilar pleural effusions and basilar atelectasis with question perihilar edema.  Question of CHF is raised.  No pneumothorax.  Diffuse osseous sclerosis compatible with widespread metastatic prostate cancer.  IMPRESSION Widespread osseous metastatic disease secondary to prostate cancer.  Enlargement of cardiac silhouette with pulmonary vascular congestion, bilateral pleural effusions and question pulmonary edema, raising question of CHF.  Bibasilar atelectasis.  SIGNATURE  Electronically Signed   By: Lavonia Dana M.D.   On: 06/30/2013 12:44   Ct Head Wo Contrast  06/30/2013   CLINICAL DATA Increasing weakness, fall  EXAM CT HEAD WITHOUT CONTRAST  TECHNIQUE Contiguous axial images were obtained from the base of the skull through the vertex without intravenous contrast.  COMPARISON None.  FINDINGS There is a small hyperdense extra-axial fluid collection along the right cerebral convexity measuring 6.7 mm in thickness along the right frontal convexity. There is an extra-axial hyperdense fluid collection along the left cerebral convexity measuring 11.6 mm in greatest thickness along the left frontal lobe. There is 4 mm of left-to-right midline shift. There is no evidence of a cortical-based area of acute  infarction.  The ventricles and sulci are appropriate for the patient's age. The basal cisterns are patent.  Visualized portions of the orbits are unremarkable. The visualized portions of the paranasal sinuses and mastoid air cells are unremarkable.  The calvarium is diffusely sclerotic consistent with metastatic disease given history of prostate cancer.  IMPRESSION 1. Bilateral acute small subdural hematomas, left greater than right. There is 4 mm of left-to-right midline shift. Critical Value/emergent results were called by telephone at the time of interpretation on 06/30/2013 at 12:55 PM to Dr. Clayton Bibles , who verbally acknowledged these results.  SIGNATURE  Electronically Signed   By: Kathreen Devoid   On: 06/30/2013 12:59    ASSESSMENT / PLAN:  PULMONARY A: Questionable airway protection at best.  Bilateral pleural effusion and hypoxemia. P:   - Supplement O2. - Discussing code status, if full code will likely require intubation for airway protection.  CARDIOVASCULAR A: Sinus tachycardia.  Normotensive. P:  - Hold HTN medications. - IV hydration. - Tele monitoring.  RENAL A:  Renal function intact. P:   - Monitor electrolytes. - Replace as needed.  GASTROINTESTINAL A:  No active issues. P:   - NPO.  HEMATOLOGIC A:  Elevated WBC. P:  - See ID section. - Daily CBC.  INFECTIOUS A:  UTI.  No septic shock. P:   - Vanc/zosyn. - Pan culture.  ENDOCRINE A:  No active issues.   P:   - Monitor.  NEUROLOGIC A:  AMS due to SDH.  Very poor prognosis. P:   - MRI for ?mets. - Recommended full comfort care at this point but wife is struggling with decision and wishes for full code for now.  Contacted Dr. Osker Mason who is recommending full comfort care. - NS called and requested and MRI since if mets are present the no surgery but if no mets then consider crani.   I have spoken extensively with the wife.  Patient's QOL has been terrible prior and his chances of survival from the  prostate cancer standpoint is very poor and very limited.  Based on that discussion I recommended full comfort care for patient but again wife is struggling with this.  I spoke with the daughter as well  who is heavily leaning towards comfort care.  Wife asked to speak to Dr. Osker Mason whom I paged and will be speaking to wife.  I have personally obtained a history, examined the patient, evaluated laboratory and imaging results, formulated the assessment and plan and placed orders.  CRITICAL CARE: The patient is critically ill with multiple organ systems failure and requires high complexity decision making for assessment and support, frequent evaluation and titration of therapies, application of advanced monitoring technologies and extensive interpretation of multiple databases. Critical Care Time devoted to patient care services described in this note is 60 minutes.   Rush Farmer, M.D. [facility] Pulmonary/Critical Care Medicine. Pager: [phone]. After hours pager: [phone].

## 2013-06-30 NOTE — ED Notes (Signed)
Received call from [facility] Neuro ICU RN stating Dr Astrid Drafts wants pt to have MRI done to rule out mets prior to transfer. PA Raquel Sarna and carelink team notified. Intensivist at bedside

## 2013-06-30 NOTE — ED Provider Notes (Signed)
Dr Nelda Marseille has seen patient and talked to wife. He wants the hospitalist to admit, pt is going to be DNR.   16:23 Dr Candiss Norse, admit to med-surg team Cord[place], MD 06/30/13 [phone]

## 2013-06-30 NOTE — H&P (Signed)
Patient Demographics  Jeff Jimenez, is a 68 y.o. male  MRN: [id number]   DOB - May 14, 1945  Admit Date - 06/30/2013  Outpatient Primary MD for the patient is [name], MD   With History of -  Past Medical History  Diagnosis Date  . Shingles 06/2009  . BPH (benign prostatic hyperplasia)   . DIVERTICULITIS, HX OF 2010  . ANEMIA-NOS   . ANXIETY   . HYPERTENSION   . 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 Paraphimosis     hx  . Thrombocytopenia     hx  . Anxiety   . Hx of radiation therapy 03/01/13-03/22/13    lumbar spine 237.5Gy/35fx      Past Surgical History  Procedure Laterality Date  . Left ankle surgery  1968  . Vasectomy  1978    in for   Chief Complaint  Patient presents with  . Fall  . Weakness     HPI  Jeff Jimenez  is a 68 y.o. 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 Alen Blew for cancer issues, today at home was being attended to by his wife and was trying to get out of the bed when he slumped to the floor, he never hit his head, according to the wife he is been getting progressively weak gradually over the last several weeks.   He was brought to the ER where his workup was suggestive of UTI, CT scan showed subdural hematomas with midline shift, neurosurgery was consulted over the phone and patient was to be transferred to [facility], ICU team was also consulted who came and saw the patient in the ER, they discussed his case in detail with patient's primary oncologist Dr. Alen Blew who often detailed discussion suggested that patient is not a candidate for MRI-surgical intervention  etc. and that palliative care should be consulted for comfort care as his underlying quality of life is poor with extremely poor long-term prognosis as well. This was discussed with family by ICU team who agree to palliative care and medical treatment only, no heroics. DO NOT RESUSCITATE. Hospitalist team was then consulted to admit the patient for medical treatment of UTI in palliative care consultation.    Review of Systems    Unobtainable as patient is drowsy.   Social History History  Substance Use Topics  . Smoking status: Never Smoker   . Smokeless tobacco: Never Used     Comment: Company secretary, retired - Married, lives with wife  . Alcohol Use: No      Family History Family History  Problem Relation Age of Onset  . Hypertension Mother   . Glaucoma Mother   . Heart disease Father   . Hypertension Father       Prior to  Admission medications   Medication Sig Start Date End Date Taking? Authorizing Provider  atenolol (TENORMIN) 50 MG tablet Take 50 mg by mouth daily.   Yes Historical Provider, MD  Calcium Carbonate-Vitamin D (CALCIUM 600+D) 600-400 MG-UNIT per tablet Take 2 tablets by mouth daily.    Yes Historical Provider, MD  denosumab (XGEVA) 120 MG/1.7ML SOLN Inject 120 mg into the skin every 30 (thirty) days. Patient receives injection in drs office   Yes Historical Provider, MD  diphenhydrAMINE (SOMINEX) 25 MG tablet Take 25 mg by mouth 2 (two) times daily.   Yes Historical Provider, MD  docusate sodium (COLACE) 100 MG capsule Take 100 mg by mouth at bedtime as needed for mild constipation.   Yes Historical Provider, MD  HYDROmorphone (DILAUDID) 4 MG tablet Take 1 tablet (4 mg total) by mouth every 4 (four) hours as needed for severe pain. 06/09/13  Yes Benjiman Core, MD  leuprolide (LUPRON DEPOT) 22.5 MG injection Inject 22.5 mg into the muscle every 6 (six) months. Or as directed by urology 01/23/12  Yes Newt Lukes, MD  lidocaine-prilocaine (EMLA) cream Apply  topically as needed. Apply approximately 1/2 teaspoon to skin over port-a-cath. Do not rub in. Place clear plastic over cream. 1-1 and 1/2 hours prior to chemotherapy treatment. 12/03/12  Yes Myrtis Ser, NP  ondansetron (ZOFRAN) 8 MG tablet Take 8 mg by mouth every 8 (eight) hours as needed for nausea or vomiting.   Yes Historical Provider, MD  OxyCODONE HCl ER (OXYCONTIN) 60 MG T12A Take 60 mg by mouth 3 (three) times daily.   Yes Historical Provider, MD  pegfilgrastim (NEULASTA) 6 MG/0.6ML injection Inject 6 mg into the skin once.   Yes Historical Provider, MD  polyethylene glycol (MIRALAX / GLYCOLAX) packet Take 17 g by mouth daily.   Yes Historical Provider, MD  predniSONE (DELTASONE) 5 MG tablet Take 5 mg by mouth every other day.   Yes Historical Provider, MD  PRESCRIPTION MEDICATION cabazitaxel (JEVTANA) 43 mg in sodium chloride 0.9 % 250 mL chemo infusion 43 mg  Once 06/09/2013  [facility]   Yes Historical Provider, MD  prochlorperazine (COMPAZINE) 10 MG tablet Take 10 mg by mouth every 6 (six) hours as needed for nausea or vomiting.   Yes Historical Provider, MD  Tamsulosin HCl (FLOMAX) 0.4 MG CAPS Take 0.4 mg by mouth daily.    Yes Historical Provider, MD    No Known Allergies  Physical Exam  Vitals  Blood pressure 112/62, pulse 63, temperature 98.4 F (36.9 C), temperature source Oral, resp. rate 16, SpO2 97.00%.   1. General elderly white male lying in bed in NAD,     2. Normal affect and insight, Not Suicidal or Homicidal, drowsy but will follow basic commands however does not answer questions verbally,  3. No F.N deficits, but diffuse generalized weakness lower extremity more than upper, ALL C.Nerves Intact, Strength 5/5 all 4 extremities, Sensation intact all 4 extremities, Plantars down going.  4. Ears and Eyes appear Normal, Conjunctivae clear, right pupil larger than the left. Moist Oral Mucosa.  5. Supple Neck, No JVD, No cervical lymphadenopathy appriciated, No Carotid  Bruits.  6. Symmetrical Chest wall movement, Good air movement bilaterally, CTAB.  7. RRR, No Gallops, Rubs or Murmurs, No Parasternal Heave.  8. Positive Bowel Sounds, Abdomen Soft, Non tender, No organomegaly appriciated,No rebound -guarding or rigidity.  9.  No Cyanosis, Normal Skin Turgor, No Skin Rash or Bruise.  10. Good muscle tone,  joints appear  normal , no effusions, Normal ROM.  11. No Palpable Lymph Nodes in Neck or Axillae     Data Review  CBC  Recent Labs Lab 06/30/13 1251  WBC 14.4*  HGB 9.5*  HCT 30.5*  PLT 198  MCV 86.9  MCH 27.1  MCHC 31.1  RDW 18.4*  LYMPHSABS 1.0  MONOABS 0.7  EOSABS 0.0  BASOSABS 0.0   ------------------------------------------------------------------------------------------------------------------  Chemistries   Recent Labs Lab 06/30/13 1251  NA 135*  K 4.1  CL 99  CO2 23  GLUCOSE 123*  BUN 9  CREATININE 0.39*  CALCIUM 7.3*  AST 28  ALT 10  ALKPHOS 278*  BILITOT 0.4   ------------------------------------------------------------------------------------------------------------------ CrCl is unknown because both a height and weight (above a minimum accepted value) are required for this calculation. ------------------------------------------------------------------------------------------------------------------ No results found for this basename: TSH, T4TOTAL, FREET3, T3FREE, THYROIDAB,  in the last 72 hours   Coagulation profile No results found for this basename: INR, PROTIME,  in the last 168 hours ------------------------------------------------------------------------------------------------------------------- No results found for this basename: DDIMER,  in the last 72 hours -------------------------------------------------------------------------------------------------------------------  Cardiac Enzymes  Recent Labs Lab 06/30/13 1251  TROPONINI <0.30    ------------------------------------------------------------------------------------------------------------------ No components found with this basename: POCBNP,    ---------------------------------------------------------------------------------------------------------------  Urinalysis    Component Value Date/Time   COLORURINE YELLOW 06/30/2013 1221   APPEARANCEUR CLOUDY* 06/30/2013 1221   LABSPEC 1.018 06/30/2013 1221   PHURINE 6.0 06/30/2013 1221   GLUCOSEU NEGATIVE 06/30/2013 1221   HGBUR SMALL* 06/30/2013 1221   HGBUR negative 11/28/2009 [place] 06/30/2013 [place] 06/30/2013 1221   PROTEINUR NEGATIVE 06/30/2013 1221   UROBILINOGEN 0.2 06/30/2013 1221   NITRITE POSITIVE* 06/30/2013 [place]* 06/30/2013 1221    ----------------------------------------------------------------------------------------------------------------  Imaging results:   Dg Chest 2 View  06/30/2013   CLINICAL DATA Fall, weakness, shortness of breath, hypertension, metastatic prostate cancer  EXAM CHEST  2 VIEW  COMPARISON 09/08/2012  FINDINGS Right jugular Port-A-Cath tip projects over SVC.  Rotated to the right.  Upper normal heart size.  Pulmonary vascular congestion.  Bibasilar pleural effusions and basilar atelectasis with question perihilar edema.  Question of CHF is raised.  No pneumothorax.  Diffuse osseous sclerosis compatible with widespread metastatic prostate cancer.  IMPRESSION Widespread osseous metastatic disease secondary to prostate cancer.  Enlargement of cardiac silhouette with pulmonary vascular congestion, bilateral pleural effusions and question pulmonary edema, raising question of CHF.  Bibasilar atelectasis.  SIGNATURE  Electronically Signed   By: Lavonia Dana M.D.   On: 06/30/2013 12:44   Ct Head Wo Contrast  06/30/2013   CLINICAL DATA Increasing weakness, fall  EXAM CT HEAD WITHOUT CONTRAST  TECHNIQUE Contiguous axial images were obtained  from the base of the skull through the vertex without intravenous contrast.  COMPARISON None.  FINDINGS There is a small hyperdense extra-axial fluid collection along the right cerebral convexity measuring 6.7 mm in thickness along the right frontal convexity. There is an extra-axial hyperdense fluid collection along the left cerebral convexity measuring 11.6 mm in greatest thickness along the left frontal lobe. There is 4 mm of left-to-right midline shift. There is no evidence of a cortical-based area of acute infarction.  The ventricles and sulci are appropriate for the patient's age. The basal cisterns are patent.  Visualized portions of the orbits are unremarkable. The visualized portions of the paranasal sinuses and mastoid air cells are unremarkable.  The calvarium is diffusely sclerotic consistent with metastatic disease given history  of prostate cancer.  IMPRESSION 1. Bilateral acute small subdural hematomas, left greater than right. There is 4 mm of left-to-right midline shift. Critical Value/emergent results were called by telephone at the time of interpretation on 06/30/2013 at 12:55 PM to Dr. Clayton Bibles , who verbally acknowledged these results.  SIGNATURE  Electronically Signed   By: Kathreen Devoid   On: 06/30/2013 12:59   Dg Abd Portable 1v  06/21/2013   CLINICAL DATA:  Rectal bleeding. Mid abdominal pain. History of prostate cancer.  EXAM: PORTABLE ABDOMEN - 1 VIEW  COMPARISON:  10/29/2012 CT.  FINDINGS: Diffuse osseous metastatic disease with mild loss of height of vertebra.  Nonspecific bowel gas pattern without plain film evidence of bowel obstruction.  The possibility of free intraperitoneal air cannot be assessed on a supine view.  IMPRESSION: Nonspecific bowel gas pattern without plain film evidence of bowel obstruction.  Diffuse osseous metastatic disease.  Please see above.   Electronically Signed   By: Chauncey Cruel M.D.   On: 06/21/2013 00:24    My personal review of EKG: Rhythm NSR, Rate   62/min,   no Acute ST changes    Assessment & Plan   1. Decreased mental status and a mild fall with slumped to the ground. Patient now has evidence of bilateral subdural hematoma with midline shift likely due to metastatic disease, as discussed in history of present illness above decision now made after consultation with ICU-patient's primary oncologist Dr. Alen Blew and family that patient will be comfort care with palliative care consult and general medical treatment.  Will be admitted to [place] bed, gentle IV fluids, supportive care, keep n.p.o. till cleared by speech, hold home narcotics which could be contributing to decreased mental status and treat underlying UTI. Palliative care to be consulted. Goal of care gentle medical treatment if further decline full comfort care. I do not see any ability of getting an MRI of the brain. For prognostic purposes repeat CT scan in few days can be obtained    2. Metastatic prostate cancer with known metastases to the spine. Supportive care and pain control only at this time. Dr. Alen Blew will be notified of the admission. Continue home dose prednisone.     3. BPH continue home dose alpha-blocker.     4. Hypertension home dose atenolol if able to take oral medications with as needed IV hydralazine.     5. UTI. Continue Rocephin, and monitor urine culture.     DVT Prophylaxis   SCDs   AM Labs Ordered, also please review Full Orders  Family Communication: Admission, patients condition and plan of care including tests being ordered have been discussed with the patient and wife who indicate understanding and agree with the plan and Code Status.  Code Status DNR  Likely DC to TBD  Condition GUARDED  +++  Time spent in minutes : 35    SINGH,PRASHANT K M.D on 06/30/2013 at 4:27 PM  Between 7am to 7pm - Pager - [phone]  After 7pm go to [email or web address] - password [email or web address]  And look for the night coverage person covering me after  hours  [facility] Office  [phone]

## 2013-06-30 NOTE — ED Provider Notes (Signed)
See prior note   Janice Norrie, MD 06/30/13 [phone]

## 2013-06-30 NOTE — ED Provider Notes (Signed)
CSN: [id number]     Arrival date & time 06/30/13  1146 History   First MD Initiated Contact with Patient 06/30/13 1151     Chief Complaint  Patient presents with  . Fall  . Weakness     (Consider location/radiation/quality/duration/timing/severity/associated sxs/prior Treatment) The history is provided by the patient.   Patient with metastatic prostate cancer with recent admission for GI bleed, d/c March 4 (7 days ago)brought in by family for generalized weakness, excessive sleeping, decreased appetite, and fall.  The weakness and sleepiness, decreased appetite have been going on for the past 2 days.  Today he was walking with his walker in the hallway and fell, stating "I feel weak" before falling.  Denies hitting head or LOC.  Denies fevers, cough, SOB, complaints of pain, URI symptoms, any continued GI bleeding.  Has had loose bowel movements.  Pt has urinary catheter and the urine is darker than usual, only 200cc output since 10pm last night.  Pt is sleeping but wakes up to answer questions and follows commands.  Pt denies any pain.  Denies SOB, cough, vomiting, diarrhea.    Level V caveat for altered level of consciousness.    Past Medical History  Diagnosis Date  . Shingles 06/2009  . BPH (benign prostatic hyperplasia)   . DIVERTICULITIS, HX OF 2010  . ANEMIA-NOS   . ANXIETY   . HYPERTENSION   . 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 Paraphimosis     hx  . Thrombocytopenia     hx  . Anxiety   . Hx of radiation therapy 03/01/13-03/22/13    lumbar spine 237.5Gy/41fx   Past Surgical History  Procedure Laterality Date  . Left ankle surgery  1968  . Vasectomy  1978   Family History  Problem Relation Age of Onset  . Hypertension Mother   . Glaucoma Mother   . Heart disease Father   . Hypertension Father    History  Substance Use Topics  . Smoking status: Never Smoker   . Smokeless  tobacco: Never Used     Comment: Company secretary, retired - Married, lives with wife  . Alcohol Use: No    Review of Systems  Unable to perform ROS: Mental status change      Allergies  Review of patient's allergies indicates no known allergies.  Home Medications   Current Outpatient Rx  Name  Route  Sig  Dispense  Refill  . atenolol (TENORMIN) 50 MG tablet   Oral   Take 1.5 tablets (75 mg total) by mouth every evening.   30 tablet   9   . Calcium Carbonate-Vitamin D (CALCIUM 600+D) 600-400 MG-UNIT per tablet   Oral   Take 2 tablets by mouth daily.          Marland Kitchen denosumab (XGEVA) 120 MG/1.7ML SOLN   Subcutaneous   Inject 120 mg into the skin every 30 (thirty) days. Patient receives injection in drs office         . diphenhydrAMINE (SOMINEX) 25 MG tablet   Oral   Take 25 mg by mouth 2 (two) times daily.         Marland Kitchen docusate sodium (COLACE) 100 MG capsule   Oral   Take 100 mg by mouth at bedtime as needed for mild constipation.         Marland Kitchen HYDROmorphone (DILAUDID) 4 MG tablet   Oral   Take 1  tablet (4 mg total) by mouth every 4 (four) hours as needed for severe pain.   90 tablet   0   . leuprolide (LUPRON DEPOT) 22.5 MG injection   Intramuscular   Inject 22.5 mg into the muscle every 6 (six) months. Or as directed by urology   1 each   0   . lidocaine-prilocaine (EMLA) cream   Topical   Apply topically as needed. Apply approximately 1/2 teaspoon to skin over port-a-cath. Do not rub in. Place clear plastic over cream. 1-1 and 1/2 hours prior to chemotherapy treatment.   30 g   2     FAXED TO PHARMACY.   Marland Kitchen ondansetron (ZOFRAN) 8 MG tablet   Oral   Take 8 mg by mouth every 8 (eight) hours as needed for nausea or vomiting.         . OxyCODONE HCl ER 60 MG T12A      Take two tablets three times a day.   180 each   0   . pegfilgrastim (NEULASTA) 6 MG/0.6ML injection   Subcutaneous   Inject 6 mg into the skin once.         . polyethylene glycol (MIRALAX  / GLYCOLAX) packet   Oral   Take 17 g by mouth daily.         . predniSONE (DELTASONE) 5 MG tablet   Oral   Take 5 mg by mouth every other day.         . prochlorperazine (COMPAZINE) 10 MG tablet   Oral   Take 10 mg by mouth every 6 (six) hours as needed for nausea or vomiting.         . Tamsulosin HCl (FLOMAX) 0.4 MG CAPS   Oral   Take 0.4 mg by mouth daily.           BP 112/69  Pulse 66  Temp(Src) 100.1 F (37.8 C) (Rectal)  Resp 14  SpO2 94% Physical Exam  Nursing note and vitals reviewed. Constitutional: He appears well-developed and well-nourished. No distress.  HENT:  Head: Normocephalic and atraumatic.  Neck: Neck supple.  Cardiovascular: Normal rate, regular rhythm and intact distal pulses.   Pulmonary/Chest: Effort normal and breath sounds normal. No stridor. No respiratory distress. He has no wheezes. He has no rales.  Abdominal: Soft. He exhibits no distension and no mass. There is no tenderness. There is no rebound and no guarding.  Genitourinary:  Urinary catheter in plan with dark tan urine  Neurological: He is alert. He exhibits normal muscle tone.  Moves all extremities.  Strength is symmetric.    Skin: He is not diaphoretic. There is pallor.    ED Course  Procedures (including critical care time) Labs Review Labs Reviewed  CBC WITH DIFFERENTIAL - Abnormal; Notable for the following:    WBC 14.4 (*)    RBC 3.51 (*)    Hemoglobin 9.5 (*)    HCT 30.5 (*)    RDW 18.4 (*)    Neutrophils Relative % 88 (*)    Lymphocytes Relative 7 (*)    Neutro Abs 12.7 (*)    All other components within normal limits  COMPREHENSIVE METABOLIC PANEL - Abnormal; Notable for the following:    Sodium 135 (*)    Glucose, Bld 123 (*)    Creatinine, Ser 0.39 (*)    Calcium 7.3 (*)    Total Protein 5.4 (*)    Albumin 2.2 (*)    Alkaline Phosphatase 278 (*)  All other components within normal limits  LIPASE, BLOOD - Abnormal; Notable for the following:     Lipase 7 (*)    All other components within normal limits  URINALYSIS, ROUTINE W REFLEX MICROSCOPIC - Abnormal; Notable for the following:    APPearance CLOUDY (*)    Hgb urine dipstick SMALL (*)    Nitrite POSITIVE (*)    Leukocytes, UA SMALL (*)    All other components within normal limits  URINE MICROSCOPIC-ADD ON - Abnormal; Notable for the following:    Bacteria, UA MANY (*)    All other components within normal limits  URINE CULTURE  CULTURE, BLOOD (ROUTINE X 2)  CULTURE, BLOOD (ROUTINE X 2)  TROPONIN I   Imaging Review Dg Chest 2 View  06/30/2013   CLINICAL DATA Fall, weakness, shortness of breath, hypertension, metastatic prostate cancer  EXAM CHEST  2 VIEW  COMPARISON 09/08/2012  FINDINGS Right jugular Port-A-Cath tip projects over SVC.  Rotated to the right.  Upper normal heart size.  Pulmonary vascular congestion.  Bibasilar pleural effusions and basilar atelectasis with question perihilar edema.  Question of CHF is raised.  No pneumothorax.  Diffuse osseous sclerosis compatible with widespread metastatic prostate cancer.  IMPRESSION Widespread osseous metastatic disease secondary to prostate cancer.  Enlargement of cardiac silhouette with pulmonary vascular congestion, bilateral pleural effusions and question pulmonary edema, raising question of CHF.  Bibasilar atelectasis.  SIGNATURE  Electronically Signed   By: Lavonia Dana M.D.   On: 06/30/2013 12:44   Ct Head Wo Contrast  06/30/2013   CLINICAL DATA Increasing weakness, fall  EXAM CT HEAD WITHOUT CONTRAST  TECHNIQUE Contiguous axial images were obtained from the base of the skull through the vertex without intravenous contrast.  COMPARISON None.  FINDINGS There is a small hyperdense extra-axial fluid collection along the right cerebral convexity measuring 6.7 mm in thickness along the right frontal convexity. There is an extra-axial hyperdense fluid collection along the left cerebral convexity measuring 11.6 mm in greatest  thickness along the left frontal lobe. There is 4 mm of left-to-right midline shift. There is no evidence of a cortical-based area of acute infarction.  The ventricles and sulci are appropriate for the patient's age. The basal cisterns are patent.  Visualized portions of the orbits are unremarkable. The visualized portions of the paranasal sinuses and mastoid air cells are unremarkable.  The calvarium is diffusely sclerotic consistent with metastatic disease given history of prostate cancer.  IMPRESSION 1. Bilateral acute small subdural hematomas, left greater than right. There is 4 mm of left-to-right midline shift. Critical Value/emergent results were called by telephone at the time of interpretation on 06/30/2013 at 12:55 PM to Dr. Clayton Bibles , who verbally acknowledged these results.  SIGNATURE  Electronically Signed   By: Kathreen Devoid   On: 06/30/2013 12:59     EKG Interpretation None      1:12 PM Discussed patient with Dr Tomi Bamberger who will also see the patient.  Received call from radiologist regarding bilateral subdural hematomas (6.7 and 11.41mm) with 12mm left to right shift.    1:25 PM Dr Sherwood Gambler made aware of the patient.  He will return call.   3:29 PM Pt was to be admitted at [facility] neuro ICU per Dr Sherwood Gambler.  Per nurse, Dr Sherwood Gambler requests MRI prior to transfer to [facility], brain mets vs subdural hematomas.   3:58 PM Patient signed out to [employer], PA-C, at change of shift.  She will ensure that patient is appropriately  dispositioned following MRI.    MDM   Final diagnoses:  Altered level of consciousness  Bilateral subdural hematomas  Urinary tract infection  Bilateral pleural effusion  Pulmonary vascular congestion  Prostate cancer metastatic to multiple sites  Fall    Pt with metastatic prostate cancer, admitted last week for diverticular bleed, presented today with generalized weakness, excessive sleepiness, decreased appetite, with fall today.  Found to have bilateral  subdural hematomas with shift.  Also with UTI, pleural effusions and pulmonary vascular congestion.  Urine culture and blood cultures pending.  Rocephin ordered.  Pt is full code.  Admitted to intensivist.  Dr Sherwood Gambler to consult.   Dr Sherwood Gambler will decide if patient is to be admitted at [facility] or [facility] following MRI.     Clayton Bibles, PA-C 06/30/13 1606

## 2013-06-30 NOTE — Progress Notes (Signed)
   CARE MANAGEMENT ED NOTE 06/30/2013  Patient:  Jeff, Jimenez   Account Number:  [id number]  Date Initiated:  06/30/2013  Documentation initiated by:  Livia Snellen  Subjective/Objective Assessment:   Patient presents to Ed with increased generalized weakness and fall.     Subjective/Objective Assessment Detail:   Patient with pmhx of prostate cancer with mets.     Action/Plan:   Patient to be admitted.   Action/Plan Detail:   Anticipated DC Date:       Status Recommendation to Physician:   Result of Recommendation:    Other ED Services  Consult Working [place]  CM consult  Other    Choice offered to / List presented to:            Status of service:  Completed, signed off  ED Comments:   ED Comments Detail:  [facility] consulted  regarding possible home hospice needs. EDCM spoke to patient's wife on unit.  Patient lives with his wife.  Patient's wife reports patient is receiving home health services with [place] for "rehab." Patient's wife states, "I don't know if I can take care of him at home."  [facility] provided patient's wife with a list of home hospice providers and a list of Hospice facilities. Patient's wife stated, "There will be no facilities, he will have home hospice."  [facility] asked patient's wife if she would like to speak to someone about hospice and what to expect.  Patient's wife reports, "No, I already have an idea of what it's about.  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  I just don't know when would be a good time to start he hospice but I believe it will be in the near future."  [facility] text Kristin transitional care specialist for [facility] to alert them of patient's admission.  EDCM informed patient's wife CM will follow her husband for discharge needs.  Patient's wife thankful for resources.  No further EDCM needs at this time.

## 2013-06-30 NOTE — ED Notes (Signed)
Bed: CB44 Expected date:  Expected time:  Means of arrival:  Comments: EMS- near syncope, orthostatic hypotension

## 2013-07-01 ENCOUNTER — Encounter (HOSPITAL_COMMUNITY): Payer: Self-pay

## 2013-07-01 ENCOUNTER — Inpatient Hospital Stay (HOSPITAL_COMMUNITY)
Admission: AD | Admit: 2013-07-01 | Discharge: 2013-07-21 | DRG: 722 | Disposition: E | Source: Ambulatory Visit | Attending: Internal Medicine | Admitting: Internal Medicine

## 2013-07-01 ENCOUNTER — Encounter (HOSPITAL_COMMUNITY): Payer: Self-pay | Admitting: Internal Medicine

## 2013-07-01 DIAGNOSIS — Z66 Do not resuscitate: Secondary | ICD-10-CM | POA: Diagnosis present

## 2013-07-01 DIAGNOSIS — G893 Neoplasm related pain (acute) (chronic): Secondary | ICD-10-CM | POA: Diagnosis present

## 2013-07-01 DIAGNOSIS — W19XXXA Unspecified fall, initial encounter: Secondary | ICD-10-CM | POA: Diagnosis present

## 2013-07-01 DIAGNOSIS — C7951 Secondary malignant neoplasm of bone: Secondary | ICD-10-CM | POA: Diagnosis present

## 2013-07-01 DIAGNOSIS — N390 Urinary tract infection, site not specified: Secondary | ICD-10-CM | POA: Diagnosis present

## 2013-07-01 DIAGNOSIS — C7952 Secondary malignant neoplasm of bone marrow: Secondary | ICD-10-CM

## 2013-07-01 DIAGNOSIS — S065XAA Traumatic subdural hemorrhage with loss of consciousness status unknown, initial encounter: Secondary | ICD-10-CM | POA: Diagnosis present

## 2013-07-01 DIAGNOSIS — C61 Malignant neoplasm of prostate: Principal | ICD-10-CM | POA: Diagnosis present

## 2013-07-01 DIAGNOSIS — Z9221 Personal history of antineoplastic chemotherapy: Secondary | ICD-10-CM

## 2013-07-01 DIAGNOSIS — C7949 Secondary malignant neoplasm of other parts of nervous system: Secondary | ICD-10-CM

## 2013-07-01 DIAGNOSIS — I10 Essential (primary) hypertension: Secondary | ICD-10-CM | POA: Diagnosis present

## 2013-07-01 DIAGNOSIS — E46 Unspecified protein-calorie malnutrition: Secondary | ICD-10-CM | POA: Diagnosis present

## 2013-07-01 DIAGNOSIS — Z515 Encounter for palliative care: Secondary | ICD-10-CM

## 2013-07-01 DIAGNOSIS — G9349 Other encephalopathy: Secondary | ICD-10-CM | POA: Diagnosis present

## 2013-07-01 DIAGNOSIS — IMO0002 Reserved for concepts with insufficient information to code with codable children: Secondary | ICD-10-CM

## 2013-07-01 DIAGNOSIS — I6200 Nontraumatic subdural hemorrhage, unspecified: Secondary | ICD-10-CM | POA: Diagnosis present

## 2013-07-01 DIAGNOSIS — Z79899 Other long term (current) drug therapy: Secondary | ICD-10-CM

## 2013-07-01 DIAGNOSIS — C7931 Secondary malignant neoplasm of brain: Secondary | ICD-10-CM | POA: Diagnosis present

## 2013-07-01 DIAGNOSIS — S065X9A Traumatic subdural hemorrhage with loss of consciousness of unspecified duration, initial encounter: Secondary | ICD-10-CM

## 2013-07-01 DIAGNOSIS — J9600 Acute respiratory failure, unspecified whether with hypoxia or hypercapnia: Secondary | ICD-10-CM | POA: Diagnosis present

## 2013-07-01 DIAGNOSIS — R627 Adult failure to thrive: Secondary | ICD-10-CM | POA: Diagnosis present

## 2013-07-01 DIAGNOSIS — Z6828 Body mass index (BMI) 28.0-28.9, adult: Secondary | ICD-10-CM

## 2013-07-01 DIAGNOSIS — Z8249 Family history of ischemic heart disease and other diseases of the circulatory system: Secondary | ICD-10-CM

## 2013-07-01 LAB — BASIC METABOLIC PANEL
BUN: 7 mg/dL (ref 6–23)
CALCIUM: 7 mg/dL — AB (ref 8.4–10.5)
CO2: 20 mEq/L (ref 19–32)
Chloride: 103 mEq/L (ref 96–112)
Creatinine, Ser: 0.36 mg/dL — ABNORMAL LOW (ref 0.50–1.35)
GFR calc Af Amer: >90 mL/min (ref >90)
GFR calc non Af Amer: >90 mL/min (ref >90)
Glucose, Bld: 98 mg/dL (ref 70–99)
Potassium: 3.9 mEq/L (ref 3.7–5.3)
Sodium: 139 mEq/L (ref 137–147)

## 2013-07-01 LAB — CBC
HCT: 29.7 % — ABNORMAL LOW (ref 39.0–52.0)
Hemoglobin: 9.2 g/dL — ABNORMAL LOW (ref 13.0–17.0)
MCH: 26.7 pg (ref 26.0–34.0)
MCHC: 31 g/dL (ref 30.0–36.0)
MCV: 86.1 fL (ref 78.0–100.0)
PLATELETS: 209 10*3/uL (ref 150–400)
RBC: 3.45 MIL/uL — ABNORMAL LOW (ref 4.22–5.81)
RDW: 18.2 % — AB (ref 11.5–15.5)
WBC: 16.8 10*3/uL — AB (ref 4.0–10.5)

## 2013-07-01 LAB — GLUCOSE, CAPILLARY: Glucose-Capillary: 101 mg/dL — ABNORMAL HIGH (ref 70–99)

## 2013-07-01 MED ORDER — MORPHINE SULFATE 10 MG/ML IJ SOLN
1.0000 mg/h | INTRAVENOUS | Status: DC
  Administered 2013-07-01: 1 mg/h via INTRAVENOUS
  Filled 2013-07-01: qty 10

## 2013-07-01 MED ORDER — ACETAMINOPHEN 650 MG RE SUPP
650.0000 mg | RECTAL | Status: DC | PRN
  Administered 2013-07-01 – 2013-07-02 (×2): 650 mg via RECTAL
  Filled 2013-07-01 (×2): qty 1

## 2013-07-01 MED ORDER — MORPHINE SULFATE 2 MG/ML IJ SOLN: 2.0000 mg | INTRAMUSCULAR | Status: DC | PRN

## 2013-07-01 MED ORDER — ONDANSETRON HCL 4 MG/2ML IJ SOLN
4.0000 mg | Freq: Four times a day (QID) | INTRAMUSCULAR | Status: DC | PRN
Start: 2013-07-01 — End: 2013-07-02

## 2013-07-01 MED ORDER — SODIUM CHLORIDE 0.9 % IV SOLN: INTRAVENOUS | Status: DC

## 2013-07-01 MED ORDER — MORPHINE BOLUS VIA INFUSION
2.0000 mg | INTRAVENOUS | Status: DC | PRN
  Administered 2013-07-02 (×4): 2 mg via INTRAVENOUS
  Filled 2013-07-01: qty 4

## 2013-07-01 MED ORDER — LORAZEPAM 2 MG/ML IJ SOLN
1.0000 mg | INTRAMUSCULAR | Status: DC | PRN
  Administered 2013-07-02: 1 mg via INTRAVENOUS
  Filled 2013-07-01: qty 1

## 2013-07-01 MED ORDER — ATROPINE SULFATE 1 % OP SOLN
2.0000 [drp] | Freq: Four times a day (QID) | OPHTHALMIC | Status: DC | PRN
Start: 2013-07-01 — End: 2013-07-02
  Administered 2013-07-01 – 2013-07-02 (×2): 2 [drp] via SUBLINGUAL

## 2013-07-01 MED ORDER — LORAZEPAM 2 MG/ML IJ SOLN: 1.0000 mg | INTRAMUSCULAR | Status: DC | PRN

## 2013-07-01 MED ORDER — KETOROLAC TROMETHAMINE 15 MG/ML IJ SOLN
30.0000 mg | Freq: Four times a day (QID) | INTRAMUSCULAR | Status: DC | PRN
  Administered 2013-07-02 (×2): 30 mg via INTRAVENOUS
  Filled 2013-07-01 (×2): qty 2

## 2013-07-01 MED ORDER — VITAMINS A & D EX OINT
TOPICAL_OINTMENT | CUTANEOUS | Status: AC
  Administered 2013-07-01: 16:00:00
  Filled 2013-07-01: qty 5

## 2013-07-01 MED ORDER — KETOROLAC TROMETHAMINE 15 MG/ML IJ SOLN: 30.0000 mg | Freq: Four times a day (QID) | INTRAMUSCULAR | Status: DC | PRN

## 2013-07-01 MED ORDER — ACETAMINOPHEN 650 MG RE SUPP
650.0000 mg | RECTAL | Status: DC | PRN
  Administered 2013-07-01 (×2): 650 mg via RECTAL
  Filled 2013-07-01 (×2): qty 1

## 2013-07-01 MED ORDER — MORPHINE SULFATE 10 MG/ML IJ SOLN
1.0000 mg/h | INTRAVENOUS | Status: DC
  Filled 2013-07-01: qty 10

## 2013-07-01 MED ORDER — SCOPOLAMINE 1 MG/3DAYS TD PT72
1.0000 | MEDICATED_PATCH | TRANSDERMAL | Status: DC
  Administered 2013-07-02: 1.5 mg via TRANSDERMAL
  Filled 2013-07-01: qty 1

## 2013-07-01 MED ORDER — LIDOCAINE-PRILOCAINE 2.5-2.5 % EX CREA: TOPICAL_CREAM | CUTANEOUS | Status: DC | PRN

## 2013-07-01 NOTE — Care Management Note (Signed)
Cm consult received for Palliative services. Palliative Medicine Team consulted, per discussion with patient and spouse at bedside, plan of care is GIP referral. Per spouse choice [facility] to provide inpatient hospice services. [place] liaison Danton Sewer made aware. Palliative attending Dr. Hilma Favors to become pt's attending for inpatient hospice services.    Venita Lick Abdallah Hern,MSN,RN [phone]

## 2013-07-01 NOTE — Discharge Summary (Signed)
Physician Discharge Summary  Jeff Jimenez Jeff Jimenez  PCP: [name], MD  Admit date: Jimenez Discharge date: 07/19/2013  Recommendations for Outpatient Follow-up:  1. The patient is being discharged to inpatient GIP/Hospice for end of life care.  Discharge Diagnoses:  Principal Problem:    Bilateral subdural hematomas with midline shift Active Problems:    ADENOCARCINOMA, PROSTATE, METASTATIC    Acute respiratory failure    Altered mental status    UTI (lower urinary tract infection)    Subdural bleeding    Fall    Discharge Condition: Imminently terminal.  Diet recommendation: NPO.  History of present illness:  Jeff Jimenez is an 68 y.o. male with PMH of metastatic prostate cancer, bedbound for several months, hypertension, anxiety disorder who was admitted 06/30/13 with weakness and collapse. Upon initial evaluation in the ER, urinalysis was consistent with UTI and CT scan showed subdural hematomas with midline shift. The palliative care consultation was requested and a DO NOT RESUSCITATE status was subsequently instituted given his poor prognosis.  Hospital Course by problem:  Principal Problem:  Bilateral subdural hematomas / subdural bleeding status post fall / altered mental status  Neurosurgery initially consulted with plans to transfer to [facility], but after case discussed with oncologist, was not felt to be a candidate for operative intervention given his overall poor prognosis. Patient was made a DO NOT RESUSCITATE status and palliative care consultation was requested for goals of care. A repeat CT scan can be obtained in 24-48 hours for further prognostic information if needed.  NPO due to near comatose state. Palliative care has seen the patient and will change status to GIP as life expectancy hours-days.  Active Problems:  ADENOCARCINOMA, PROSTATE, METASTATIC  Dr. Alen Blew aware of admission. Palliative care consultation  requested. Place on morphine drip given opiate dependency and high risk for withdrawal and promote comfort.  Acute respiratory failure  Start morphine drip and give when necessary morphine for breakthrough respiratory distress/discomfort.  UTI (lower urinary tract infection) / fever  Discontinued Rocephin. Treat fever with rectal Tylenol for IV Toradol as needed.  Procedures:  None  Consultations:  Dr. Lane Hacker, Palliative Care.  Discharge Exam: Filed Vitals:   07/09/2013 0935  BP:   Pulse:   Temp: 100.6 F (38.1 C)  Resp:    Filed Vitals:   07/04/2013 0012 06/22/2013 0329 06/27/2013 0557 06/30/2013 0935  BP:   120/68   Pulse:   102   Temp: 100.2 F (37.9 C) 99.5 F (37.5 C) 98.6 F (37 C) 100.6 F (38.1 C)  TempSrc: Axillary Oral Axillary Axillary  Resp:   24   Height:      Weight:   65.409 kg (144 lb 3.2 oz)   SpO2:   100%     Gen:  Near comatose Cardiovascular:  Tachycardic, No M/R/G Respiratory: Lungs with crackles throughout Gastrointestinal: Abdomen soft, NT/ND with normal active bowel sounds. Extremities: No lower extremity C/E/C   Discharge Instructions   Future Appointments Provider Department Dept Phone   07/05/2013 11:00 AM [facility] [phone]   07/09/2013 9:15 AM [facility] [phone]   07/09/2013 9:45 AM Maryanna Shape, NP [facility] [phone]   07/12/2013 12:00 PM [facility] [phone]   07/30/2013 10:00 AM Rowe Clack, MD [facility] [phone]  Medication List    ASK your doctor about these medications       atenolol 50 MG tablet  Commonly known as:  TENORMIN  Take 50 mg by mouth daily.     CALCIUM 600+D 600-400 MG-UNIT per tablet  Generic drug:  Calcium Carbonate-Vitamin D  Take 2 tablets by mouth daily.      diphenhydrAMINE 25 MG tablet  Commonly known as:  SOMINEX  Take 25 mg by mouth 2 (two) times daily.     docusate sodium 100 MG capsule  Commonly known as:  COLACE  Take 100 mg by mouth at bedtime as needed for mild constipation.     FLOMAX 0.4 MG Caps capsule  Generic drug:  tamsulosin  Take 0.4 mg by mouth daily.     HYDROmorphone 4 MG tablet  Commonly known as:  DILAUDID  Take 1 tablet (4 mg total) by mouth every 4 (four) hours as needed for severe pain.     lidocaine-prilocaine cream  Commonly known as:  EMLA  Apply topically as needed. Apply approximately 1/2 teaspoon to skin over port-a-cath. Do not rub in. Place clear plastic over cream. 1-1 and 1/2 hours prior to chemotherapy treatment.     LUPRON DEPOT 22.5 MG injection  Generic drug:  leuprolide  Inject 22.5 mg into the muscle every 6 (six) months. Or as directed by urology     NEULASTA 6 MG/0.6ML injection  Generic drug:  pegfilgrastim  Inject 6 mg into the skin once.     ondansetron 8 MG tablet  Commonly known as:  ZOFRAN  Take 8 mg by mouth every 8 (eight) hours as needed for nausea or vomiting.     OXYCONTIN 60 MG T12a  Generic drug:  OxyCODONE HCl ER  Take 60 mg by mouth 3 (three) times daily.     polyethylene glycol packet  Commonly known as:  MIRALAX / GLYCOLAX  Take 17 g by mouth daily.     predniSONE 5 MG tablet  Commonly known as:  DELTASONE  Take 5 mg by mouth every other day.     PRESCRIPTION MEDICATION  - cabazitaxel (JEVTANA) 43 mg in sodium chloride 0.9 % 250 mL chemo infusion 43 mg  Once 06/09/2013   - [facility]     prochlorperazine 10 MG tablet  Commonly known as:  COMPAZINE  Take 10 mg by mouth every 6 (six) hours as needed for nausea or vomiting.     XGEVA 120 MG/1.7ML Soln injection  Generic drug:  denosumab  Inject 120 mg into the skin every 30 (thirty) days. Patient receives injection in drs office          The results of significant diagnostics from this hospitalization  (including imaging, microbiology, ancillary and laboratory) are listed below for reference.    Significant Diagnostic Studies: Dg Chest 2 View  Jimenez   CLINICAL DATA Fall, weakness, shortness of breath, hypertension, metastatic prostate cancer  EXAM CHEST  2 VIEW  COMPARISON 09/08/2012  FINDINGS Right jugular Port-A-Cath tip projects over SVC.  Rotated to the right.  Upper normal heart size.  Pulmonary vascular congestion.  Bibasilar pleural effusions and basilar atelectasis with question perihilar edema.  Question of CHF is raised.  No pneumothorax.  Diffuse osseous sclerosis compatible with widespread metastatic prostate cancer.  IMPRESSION Widespread osseous metastatic disease secondary to prostate cancer.  Enlargement of cardiac silhouette with pulmonary vascular congestion, bilateral pleural effusions and question pulmonary edema, raising question of CHF.  Bibasilar atelectasis.  SIGNATURE  Electronically Signed   By: Lavonia Dana M.D.   On: 06/30/2013 12:44   Ct Head Wo Contrast  Jimenez   CLINICAL DATA Increasing weakness, fall  EXAM CT HEAD WITHOUT CONTRAST  TECHNIQUE Contiguous axial images were obtained from the base of the skull through the vertex without intravenous contrast.  COMPARISON None.  FINDINGS There is a small hyperdense extra-axial fluid collection along the right cerebral convexity measuring 6.7 mm in thickness along the right frontal convexity. There is an extra-axial hyperdense fluid collection along the left cerebral convexity measuring 11.6 mm in greatest thickness along the left frontal lobe. There is 4 mm of left-to-right midline shift. There is no evidence of a cortical-based area of acute infarction.  The ventricles and sulci are appropriate for the patient's age. The basal cisterns are patent.  Visualized portions of the orbits are unremarkable. The visualized portions of the paranasal sinuses and mastoid air cells are unremarkable.  The calvarium is diffusely sclerotic  consistent with metastatic disease given history of prostate cancer.  IMPRESSION 1. Bilateral acute small subdural hematomas, left greater than right. There is 4 mm of left-to-right midline shift. Critical Value/emergent results were called by telephone at the time of interpretation on Jimenez at 12:55 PM to Dr. Clayton Bibles , who verbally acknowledged these results.  SIGNATURE  Electronically Signed   By: Kathreen Devoid   On: 06/30/2013 12:59   Dg Abd Portable 1v  06/21/2013   CLINICAL DATA:  Rectal bleeding. Mid abdominal pain. History of prostate cancer.  EXAM: PORTABLE ABDOMEN - 1 VIEW  COMPARISON:  10/29/2012 CT.  FINDINGS: Diffuse osseous metastatic disease with mild loss of height of vertebra.  Nonspecific bowel gas pattern without plain film evidence of bowel obstruction.  The possibility of free intraperitoneal air cannot be assessed on a supine view.  IMPRESSION: Nonspecific bowel gas pattern without plain film evidence of bowel obstruction.  Diffuse osseous metastatic disease.  Please see above.   Electronically Signed   By: Chauncey Cruel M.D.   On: 06/21/2013 00:24    Labs:  Basic Metabolic Panel:  Recent Labs Lab 06/30/13 1251 07/11/2013 0500  NA 135* 139  K 4.1 3.9  CL 99 103  CO2 23 20  GLUCOSE 123* 98  BUN 9 7  CREATININE 0.39* 0.36*  CALCIUM 7.3* 7.0*   GFR Estimated Creatinine Clearance: 71.2 ml/min (by C-G formula based on Cr of 0.36). Liver Function Tests:  Recent Labs Lab 06/30/13 1251  AST 28  ALT 10  ALKPHOS 278*  BILITOT 0.4  PROT 5.4*  ALBUMIN 2.2*    Recent Labs Lab 06/30/13 1251  LIPASE 7*   CBC:  Recent Labs Lab 06/30/13 1251 07/16/2013 0500  WBC 14.4* 16.8*  NEUTROABS 12.7*  --   HGB 9.5* 9.2*  HCT 30.5* 29.7*  MCV 86.9 86.1  PLT 198 209   Cardiac Enzymes:  Recent Labs Lab 06/30/13 1251  TROPONINI <0.30   CBG:  Recent Labs Lab 07/07/2013 0800  GLUCAP 101*   Microbiology Recent Results (from the past 240 hour(s))  CULTURE,  BLOOD (ROUTINE X 2)     Status: None   Collection Time    06/30/13  1:25 PM      Result Value Ref Range Status   Specimen Description BLOOD LEFT HAND   Final   Special Requests BOTTLES DRAWN AEROBIC AND ANAEROBIC 3 CC EA   Final   Culture  Setup Time     Final   Value: 06/30/2013 16:09  Performed at [facility]     Final   Value:        BLOOD CULTURE RECEIVED NO GROWTH TO DATE CULTURE WILL BE HELD FOR 5 DAYS BEFORE ISSUING A FINAL NEGATIVE REPORT     Performed at [facility]   Report Status PENDING   Incomplete  CULTURE, BLOOD (ROUTINE X 2)     Status: None   Collection Time    06/30/13  1:30 PM      Result Value Ref Range Status   Specimen Description BLOOD RIGHT HAND   Final   Special Requests BOTTLES DRAWN AEROBIC ONLY 4 CC   Final   Culture  Setup Time     Final   Value: 06/30/2013 16:09     Performed at [facility]   Culture     Final   Value:        BLOOD CULTURE RECEIVED NO GROWTH TO DATE CULTURE WILL BE HELD FOR 5 DAYS BEFORE ISSUING A FINAL NEGATIVE REPORT     Performed at [facility]   Report Status PENDING   Incomplete    Time coordinating discharge: 35 minutes.  Signed:  Mandeep Ferch  Pager [phone] [facility] 07/20/2013, 10:46 AM

## 2013-07-01 NOTE — Progress Notes (Signed)
[facility] Social Work Note:  [facility] received request to evaluate for Jeff Jimenez to be admitted to hospice in the hospital. Per [facility] RN Danton Sewer patient eligible with metastatic prostate CA ICD 185. Met with patient's spouse Jeff Jimenez and daughter Jeff Jimenez at bedside to confirm interest, explain services and complete admission paper work. They met Dr. Hilma Favors earlier this morning with Dr. Rockne Menghini and are pleased for Dr. Hilma Favors to assume care. Forms have been delivered to admitting. RNCM and Dr. Hilma Favors aware. Family declined [facility] and hospital chaplain support due to needs are being met by multiple church families. Family aware [facility] team seeing patient daily with Dr. Hilma Favors as attending. Please do not hesitate to contact me for support to this family. Comfort cart requested, RN and charge RN aware. Thank you. Erling Conte LCSW [phone]

## 2013-07-01 NOTE — Progress Notes (Signed)
[facility]  Patient Status: Active (receiving services up to time of hospitalization)  [facility] is providing the following services: PT  If patient discharges after hours, please call [phone].   Jeff Jimenez 07-17-2013, 9:37 AM

## 2013-07-01 NOTE — Progress Notes (Signed)
Nutrition Brief Note   Patient identified on Low Braden Report and Malnutrition Screening Tool Risk Chart reviewed. Pt now transitioning to comfort care.  No further nutrition interventions warranted at this time.  Please re-consult as needed.    Atlee Abide MS RD LDN Clinical Dietitian [phone]

## 2013-07-01 NOTE — Progress Notes (Signed)
CSW received referral to explore hospice options.  CSW spoke with PMT MD, Dr. Hilma Favors who met with pt wife and recommendation is for GIP.  RNCM, Mechele Claude aware and plans to assist with GIP referral.  No further social work needs identified at this time.  CSW signing off.   Alison Murray, MSW, [place] Work [phone]

## 2013-07-01 NOTE — Progress Notes (Signed)
Patient known to me. Event noted and these findings discussed with the wife.  He is approaching end stage prostate cancer. There is no planned treatment for him at this time.  I agree with DNR and favor comfort care only.

## 2013-07-01 NOTE — H&P (Addendum)
[facility]  Date of Service: 07/18/2013  Length of Stay: 0 Patient's PCP: [name], MD  Attending Physician: Acquanetta Chain  Patient Name: Jeff Jimenez  MRN: [id number]  CSN:   Location: 1314/1314-01 Consulting Physician: Lane Hacker  Note Date and Time: 07/20/2013 11:01 PM  Date of Admission: 06/30/2013 12:33 PM    Jeff Jimenez is a 68 y.o. male admitted to [facility] care actively dying of terminal metatstatic prostate cancer. Mr. Ratterree has severe failure to thrive and was brought to the ED via EMS after sustaining a fall/collapse at home. CT imaging showed large bilateral subdural hematomas and brain metastasis with midline shift. Other work up in ED showed probable UTI. Oncology has evaluated patient and recommended hospice and no further interventions other than comfort care have been recommended.   Patient requires aggressive, intensive treatment to control pain and/or symptoms. Care can not feasibly be provided in any other setting.  Symptoms: 1. Dyspnea, worsening congestion, aspiration 2. Pain, on chronic high dose opiates for bone pain related to cancer 3. Dysphagia 4. Immobility 5. Encephalopathy-herniation syndrome 6. High risk for seizures  Problem List:  Patient Active Problem List   Diagnosis Date Noted  . Fall  07/12/2013  . Prostate cancer 06/23/2013  . Bilateral subdural hematomas 06/30/2013  . Acute respiratory failure 06/30/2013  . Altered mental status 06/30/2013  . UTI (lower urinary tract infection) 06/30/2013  . Subdural bleeding 06/30/2013  . GI bleed 06/20/2013  . Rectal bleeding 06/20/2013  . Acute blood loss anemia 06/20/2013  . Hyperglycemia 07/23/2012  . ANXIETY 04/25/2009  . HYPERTENSION 04/25/2009  . ADENOCARCINOMA, PROSTATE, METASTATIC 02/23/2009  . MUSCLE SPASM, BACK 02/23/2009  . DIVERTICULITIS, HX OF 02/23/2009  . ANEMIA-NOS 02/22/2009    Medications:  Scheduled:      Infusions:  . sodium chloride    . morphine      PRN:  acetaminophen, atropine, ketorolac, lidocaine-prilocaine, LORazepam, morphine, ondansetron (ZOFRAN) IV  Prior to Admission Medications:  Prescriptions prior to admission  Medication Sig Dispense Refill  . atenolol (TENORMIN) 50 MG tablet Take 50-75 mg by mouth 2 (two) times daily. 50 mg every morning and 75 mg every evening.      . Calcium Carb-Cholecalciferol (CALCIUM-VITAMIN D) 600-400 MG-UNIT TABS Take 2 tablets by mouth daily.      Marland Kitchen denosumab (XGEVA) 120 MG/1.7ML SOLN injection Inject 120 mg into the skin every 30 (thirty) days.      . diphenhydrAMINE (SOMINEX) 25 MG tablet Take 25 mg by mouth 2 (two) times daily.      Marland Kitchen docusate sodium (COLACE) 100 MG capsule Take 100 mg by mouth at bedtime as needed for mild constipation.      Marland Kitchen HYDROmorphone (DILAUDID) 4 MG tablet Take 4 mg by mouth every 4 (four) hours as needed for severe pain.      Marland Kitchen leuprolide (LUPRON) 22.5 MG injection Inject 22.5 mg into the muscle every 6 (six) months.      . lidocaine-prilocaine (EMLA) cream Apply 1 application topically as needed. Apply approximately 1/2 teaspoon to skin over port-a-cath Do not rub in Place clear plastic over cream.  1 and 1/2 hours prior to chemotherapy treatment.      . ondansetron (ZOFRAN) 8 MG tablet Take 8 mg by mouth every 8 (eight) hours as needed for nausea or vomiting.      . OxyCODONE HCl ER 60 MG T12A Take 120 mg by mouth 3 (three) times daily.      Marland Kitchen  pegfilgrastim (NEULASTA) 6 MG/0.6ML injection Inject 6 mg into the skin once.      . polyethylene glycol (MIRALAX / GLYCOLAX) packet Take 17 g by mouth daily.      . predniSONE (DELTASONE) 5 MG tablet Take 5 mg by mouth every other day.      Marland Kitchen PRESCRIPTION MEDICATION Inject into the vein. cabazitaxel (JEVTANA) 43 mg in sodium chloride 0.9 % 250 mL chemo infusion 43 mg  Once 06/09/2013      . prochlorperazine (COMPAZINE) 10 MG tablet Take 10 mg by mouth every 6 (six) hours as  needed for nausea or vomiting.      . tamsulosin (FLOMAX) 0.4 MG CAPS capsule Take 0.4 mg by mouth daily.        Allergies: No Known Allergies  Diet: comfort feeding Medical History:  Past Medical History  Diagnosis Date  . Shingles 06/2009  . BPH (benign prostatic hyperplasia)   . DIVERTICULITIS, HX OF 2010  . ANEMIA-NOS   . ANXIETY   . HYPERTENSION   . 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 Paraphimosis     hx  . Thrombocytopenia     hx  . Anxiety   . Hx of radiation therapy 03/01/13-03/22/13    lumbar spine 237.5Gy/21fx    Surgical History:  Past Surgical History  Procedure Laterality Date  . Left ankle surgery  1968  . Vasectomy  1978    Family History:  Family History  Problem Relation Age of Onset  . Hypertension Mother   . Glaucoma Mother   . Heart disease Father   . Hypertension Father     Social History:  History   Social History  . Marital Status: Married    Spouse Name: N/A    Number of Children: N/A  . Years of Education: N/A   Occupational History  . Not on file.   Social History Main Topics  . Smoking status: Never Smoker   . Smokeless tobacco: Never Used     Comment: Company secretary, retired - Married, lives with wife  . Alcohol Use: No  . Drug Use: No  . Sexual Activity: No   Other Topics Concern  . Not on file   Social History Narrative  . No narrative on file    Review of Systems:  Review of systems not obtained due to patient factors.  Physical Examination:  BP  Min: 87/60  Max: 138/59 Temp  Avg: 98.4 F (36.9 C)  Min: 97.4 F (36.3 C)  Max: 100.6 F (38.1 C) Pulse  Avg: 74.1  Min: 58  Max: 102 Resp  Avg: 17.7  Min: 14  Max: 24 ECG Heart Rate  Avg: 76  Min: 66  Max: 86  SpO2  Avg: 96.2 %  Min: 88 %  Max: 100 % O2 Flow Rate (L/min)  Avg: 2 L/min  Min: 2 L/min  Max: 2 L/min     Total I/O In: -  Out: 850 [Urine:850]   Unresponsive to voice, with  gentle sternal rub he grimaces, very diaphoretic, audible upper airway secretions, flushed skin, hot to touch/febrile. Tachycardic, respiratory rate increased. +rhonchi, +peripheral edema, echymosis on extremities, L pupil >right, sluggish  Past Medical History:  has a past medical history of Shingles (06/2009); BPH (benign prostatic hyperplasia); DIVERTICULITIS, HX OF (2010); ANEMIA-NOS; ANXIETY; HYPERTENSION; Prostate cancer, primary, with metastasis from prostate to other site (01/2009 dx); Paraphimosis; Thrombocytopenia; Anxiety; and  radiation therapy (03/01/13-03/22/13).   Code Status: DNR   Advanced Directives: No directive on file  Family/Support: Wife and daughter at bedside  Assessment:  Patient's current condition: terminal, actively dying Patient Symptom Assessment Flowsheet has been completed.  Family is at bedside and coping well-appreciative of hospice involvement  Discharge Planning: anticipate hospital death   Plan:   Support offered at this time.    Palliative Care Team will attend as primary service for this patient.   Started on Morphine infusion for basal pain control- patient on high levels of opiates prior to admission for bone pain/mets. Will titrate for comfort.   Ativan scheduled and prn for seizure prophylaxis and agitation.   Scop patch and atropine for upper airway secretions.  Completed by:   Lane Hacker, DO Palliative Medicine

## 2013-07-04 LAB — URINE CULTURE: Colony Count: >=100000

## 2013-07-05 ENCOUNTER — Ambulatory Visit: Payer: Medicare Other

## 2013-07-06 LAB — CULTURE, BLOOD (ROUTINE X 2)
Culture: NO GROWTH
Culture: NO GROWTH

## 2013-07-09 ENCOUNTER — Other Ambulatory Visit: Payer: Medicare Other

## 2013-07-09 ENCOUNTER — Ambulatory Visit: Payer: Medicare Other | Admitting: Oncology

## 2013-07-12 ENCOUNTER — Ambulatory Visit (HOSPITAL_COMMUNITY): Payer: Medicare Other

## 2013-07-21 NOTE — Progress Notes (Signed)
[facility] Social Work Note:  Chart reviewed and met with patient's spouse Georgette at bedside. Daughter Cecille Rubin present and engaged with multiple members from their "church support group." Georgette continues to be pleased, expressed they are being able to make themselves "at home" in the hospital which is their goal. 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 They are pleased daughter Antony Salmon was able to visit last evening after work and hoping she will get another visit this afternoon. Acknowledge her "being tired in a good way" and affirmed their devotion to patient. She is aware [facility] team will follow over weekend. Thank you. Erling Conte LCSW [phone]

## 2013-07-21 NOTE — Progress Notes (Signed)
RN was called into room by family. Pt. Was found to have no pulse or respirations this was confirmed with Aldean Baker, RN at [phone]

## 2013-07-21 NOTE — Progress Notes (Signed)
2013-07-28 1200  Clinical Encounter Type  Visited With Family (wife Dara Lords and daughter Margarita Grizzle)  Visit Type Spiritual support;Social support;Patient actively dying  Referral From Nurse  Spiritual Encounters  Spiritual Needs Emotional;Grief support  Stress Factors  Family Stress Factors Loss of control;Major life changes (facing loss)   Visited with Jimmy's wife Dara Lords and daughter Margarita Grizzle to offer spiritual and emotional support.  Georgette reports great support from church and wider community of family and friends; Theme park manager and church member were visiting when I arrived.  Family used opportunity to share and process stories as they reflect on many family deaths in the past three years and consider their own reminiscences and healing.  Provided pastoral presence, reflective listening, grief education, assistance with meaning-making and storytelling, encouragement to practice self-care.  Family appreciative and aware of ongoing chaplain availability.  [place], [place]

## 2013-07-21 NOTE — Plan of Care (Signed)
Problem: Phase I Progression Outcomes Goal: Voiding-avoid urinary catheter unless indicated Outcome: Not Applicable Date Met:  20/23/34 Has chronic foley

## 2013-07-21 NOTE — Discharge Summary (Signed)
Death Summary  DIO GILLER EBX:[id number] DOB: 1945-10-23 DOA: 07-10-2013  PCP: [name], MD PCP/Office notified: yes  Admit date: 07/10/13 Date of Death: July 11, 2013  Final Diagnoses:  Principal Problem:   Prostate cancer Active Problems:   Bilateral subdural hematomas   Acute respiratory failure   UTI (lower urinary tract infection)   Fall    Cancer related pain   Unspecified protein-calorie malnutrition   History of present illness:  68 year old male with history of metastatic prostate cancer status post multiple cycles of chemotherapy and radiation with rapid decline over the past two weeks. He sustained a fall/collapse at home on 06/30/2013 and was found to have bilateral subdural hematomoas and midline shift. A decision was made to initiate comfort measures and EOL care. GIP admission requested on Jul 10, 2013.  Hospital Course:  Admitted under GIP hospice on 07-10-13 for aggressive and intensive treatment to control dyspnea, pain and other EOL symptoms. Care could not feasibly be provided in any other setting. He was started on a morphine infusion which was totrated for comfort. IV ativan administered for agitation and control of seizures at EOL.   Patient expired at apx 1500. Family at bedside. Death Certificate completed.    Time: 30 minutes.  SignedLane Hacker  [facility] July 11, 2013, 4:13 PM

## 2013-07-21 NOTE — Progress Notes (Signed)
Inpatient Rm [place] Joneen Caraway, [facility] RN Visit- M. Wynetta Emery, RN Late entry 12 noon pt seen at bedside: Related admission to [facility] GIP level of care; Dx Prostate Cancer (185); chart reviewed, information and pt condition discussed with attending pt continuing to decline, at risk for seizure activity, too fragile to be moved; pt seen at bedside earlier in the day, unresponsive to voice or light touch; RR=24, moist upper airway congestion noted, face flushed, warm to touch, intermittent fever, LE restlessness; per chart notes increased pain/agitation yesterday, continues to require ongoing assessment for non-verbal s/sx pain/discomfort; continuous IV Morphine at 1mg /hr, Scopolamine patch and prn Ativan in place for comfort per attending; spoke with pt's wife Dara Lords and daughter Margarita Grizzle at bedside who voiced they are aware pt's time is very short and their primary goal is for pt to be comfortable; discussed and educated on current medications in place for comfort;  emotional support offered; wife shared family also has support from their church; family aware [facility] team collaborates with attending physician Dr Hilma Favors and will continue to follow daily.    Please call [facility] @ [phone]-  with any hospice needs.   Thank you.  Danton Sewer, RN  Hospice Liaison  [phone])

## 2013-07-21 NOTE — Progress Notes (Signed)
Inpatient [place]m [place] Joneen Caraway, [facility] RN Visit- M. Wynetta Emery, RN   GIP Related admission; Dx Prostate Cancer (185); DNR code status; pt seen unresponsive to voice, light touch, somewhat labored respirations with upper airway congestion noted-RR=20-22 O2 @ 2 LNC; requiring oral suctioning, required total of 8 mg additional prn IV Morphine since midnight; basal Morphine rate increased to 2 mg/hr; continues to require ongoing assessment for non-verbal s/sx of pain/discomfort -discussed pt current symptom needs with staff RN Verline Lema she will assess need for PRN Ativan;  wife and daughter Margarita Grizzle at bedside, voiced they have noticed pt's breathing and color has changed- they understand this is expected; have been reading booklet 'Gone From My Sight'; Probation officer also shared [employer] which speak to EOL s/sx to be expected; Georgette shared family/friends gathered at bedside last evening and are all comforted by large extended church support; they voiced pt's second daughter is at work today and they are hopeful she will be able to be at bedside later 'if Dad is still with [place]'; daughter tearful, emotional support offered.   [facility] team collaborates with attending physician Dr Hilma Favors and will continue to follow daily. Please [facility] [phone] with any hospice needs  Danton Sewer, RN [phone])

## 2013-07-21 DEATH — deceased

## 2013-07-30 ENCOUNTER — Ambulatory Visit: Payer: Medicare Other | Admitting: Internal Medicine

## 2013-12-14 ENCOUNTER — Other Ambulatory Visit: Payer: Self-pay | Admitting: Pharmacist

## 2015-03-06 IMAGING — CT CT ABD-PELV W/ CM
2 of 5 series · 15 of 46 positions shown, 17 images · IV contrast (OMNIPAQUE)
Comparison: CT 11/06/2011.  Bone scan today and 11/06/2011.

CT CHEST

CLINICAL DATA: Restaging metastatic prostate cancer.

CT CHEST, ABDOMEN AND PELVIS WITH CONTRAST
TECHNIQUE: Multidetector CT imaging of the chest, abdomen and
pelvis was performed following the standard protocol during bolus
administration of intravenous contrast.
Contrast: 100mL OMNIPAQUE IOHEXOL 300 MG/ML  SOLN

[Series 2: cap with st · axial · 0.88mm/px · z∈[-616,-81]mm · 12 of 121 slices shown, 14 images]
[im 7/121  soft-tissue]
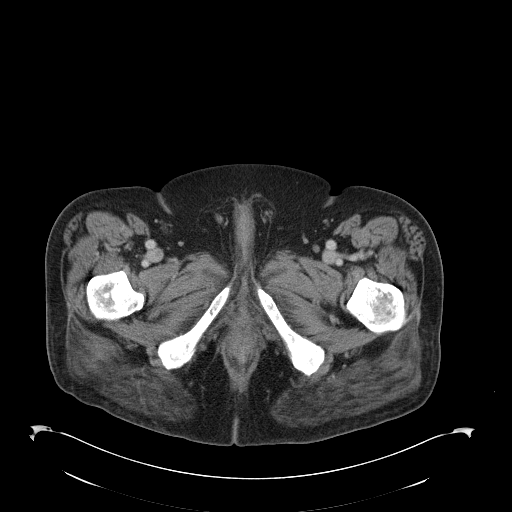
[im 7/121  bone]
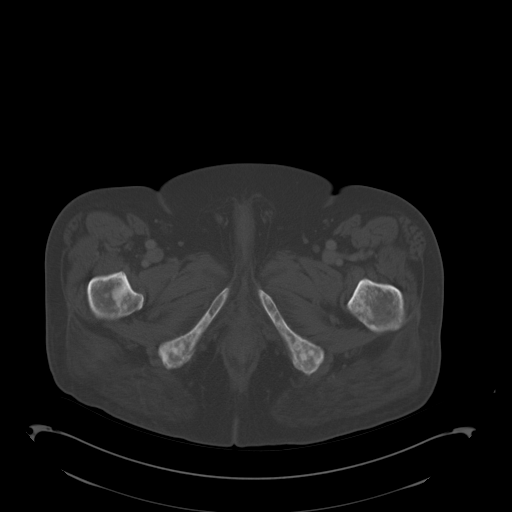
[im 21/121  soft-tissue]
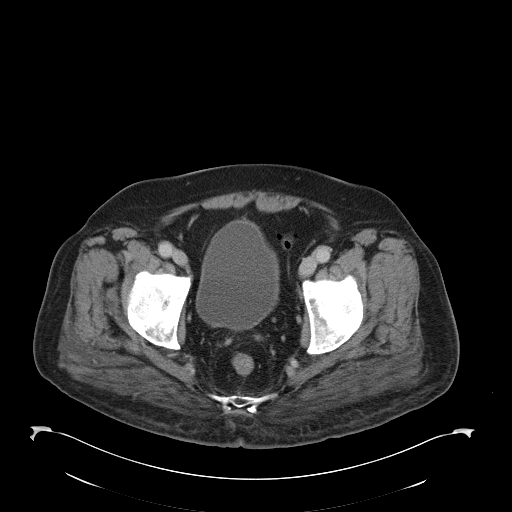
[im 27/121  soft-tissue]
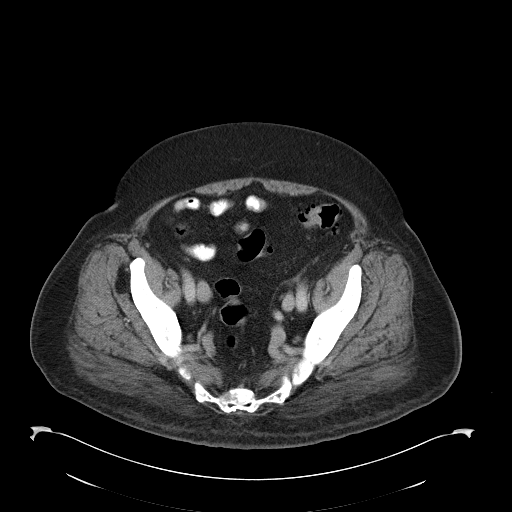
[im 34/121  soft-tissue]
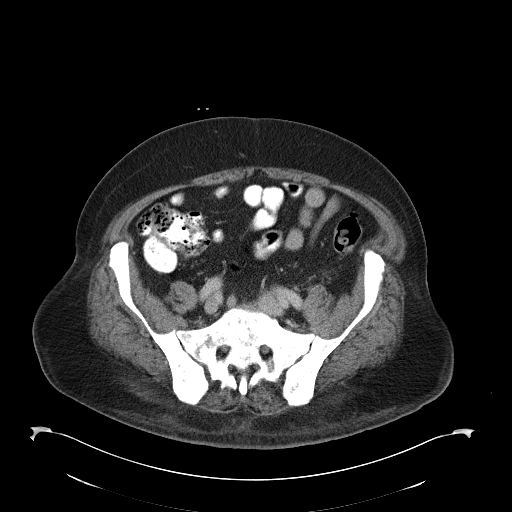
[im 47/121  soft-tissue]
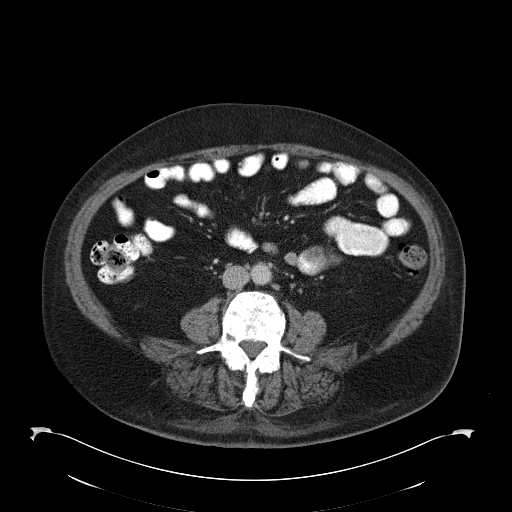
[im 54/121  soft-tissue]
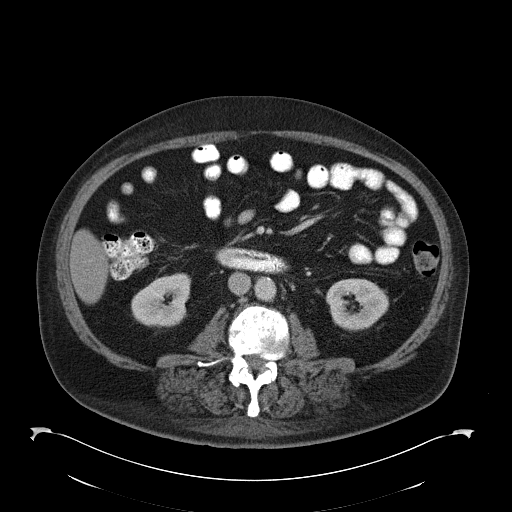
[im 67/121  soft-tissue]
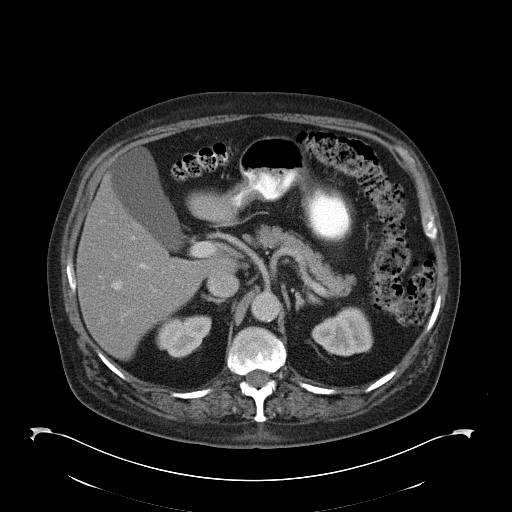
[im 74/121  soft-tissue]
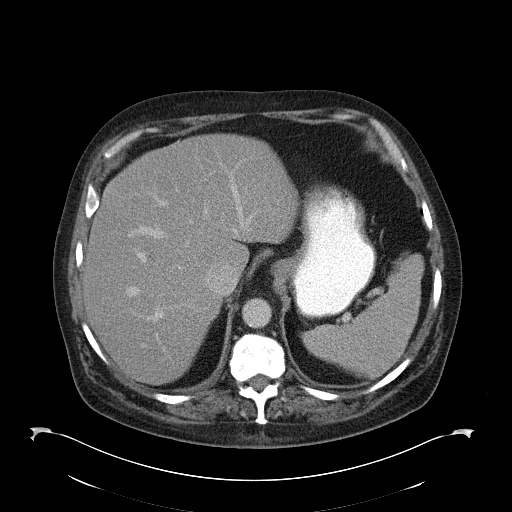
[im 87/121  soft-tissue]
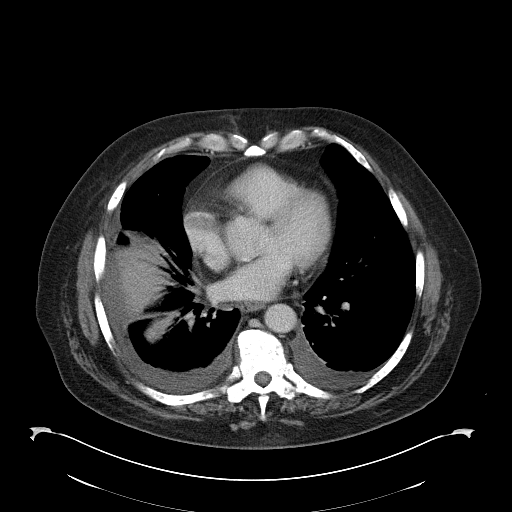
[im 87/121  bone]
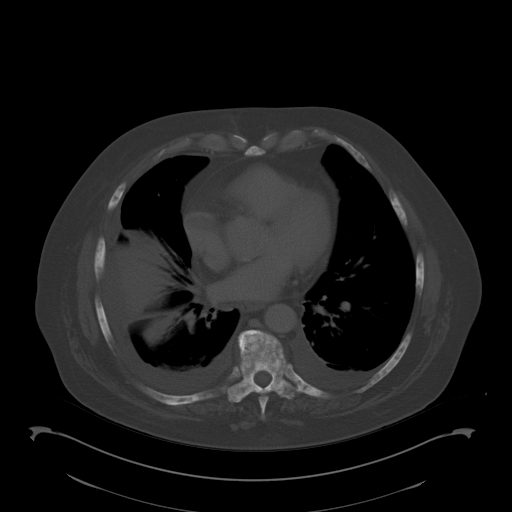
[im 94/121  soft-tissue]
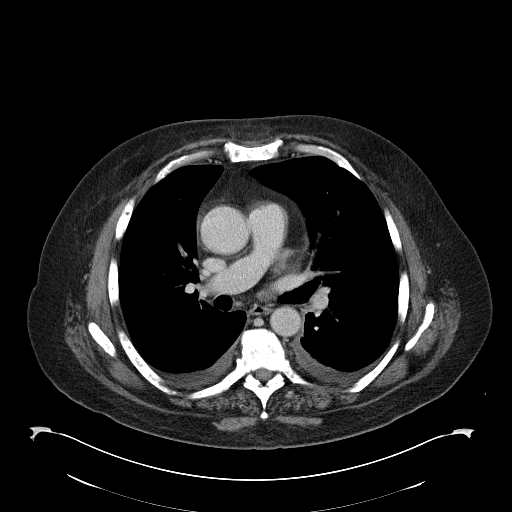
[im 101/121  soft-tissue]
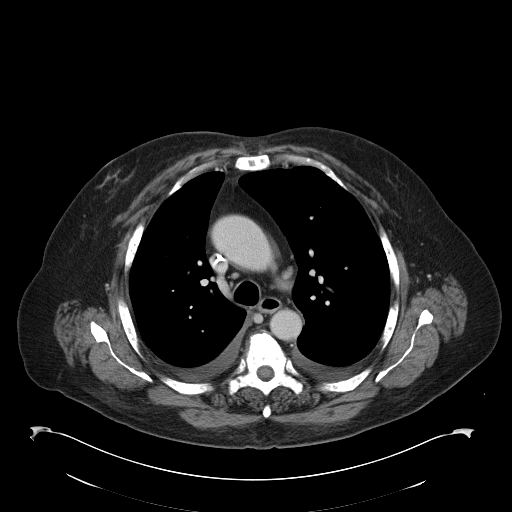
[im 114/121  soft-tissue]
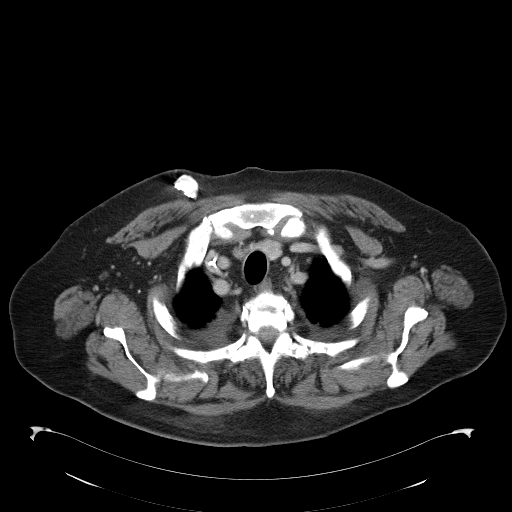

[Series 602: <mpr thick range> · coronal · 1.18mm/px · 3 of 94 slices shown]
[im 32/94  soft-tissue]
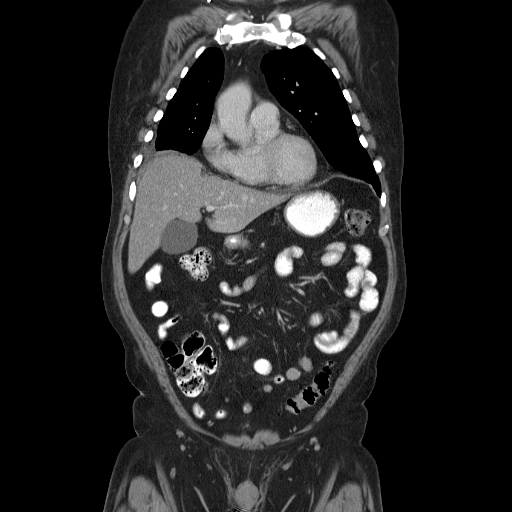
[im 42/94  soft-tissue]
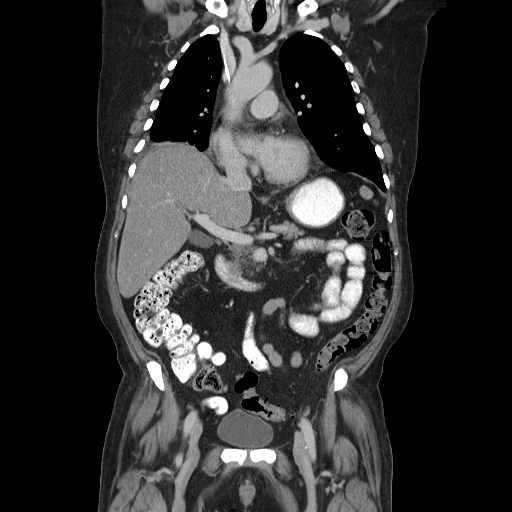
[im 52/94  soft-tissue]
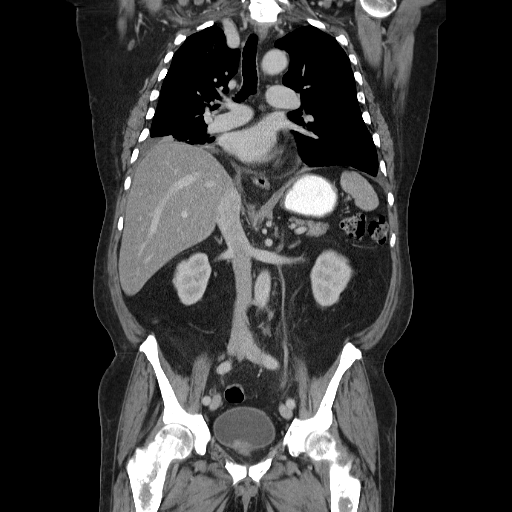

[15 of 46 positions shown; findings below may reference images not displayed]

FINDINGS: Right IJ Port-A-Cath has been placed with its tip at the
SVC right atrial junction.  Minimal right thyroid nodularity is
stable.  Small mediastinal lymph nodes are stable.  There are no
enlarged mediastinal, hilar or axillary lymph nodes.

There is stable mild dilatation of the ascending aorta measuring up
to 4.5 cm transverse.  There is minimal associated atherosclerosis.
Small bilateral pleural effusions have developed.  There is no
pericardial effusion.

Increased dependent atelectasis is present in both lung bases.  A 3
mm right upper lobe nodule on image 18 is stable.  No enlarging
pulmonary nodules are identified.

Widespread osseous metastatic disease is again demonstrated with
interval progressive sclerosis.  Thoracic spine compression
deformities are grossly stable, most prominent at T3 and T4.  No
significant epidural tumor is seen.
IMPRESSION: 1.  Progressive sclerosis of diffuse osseous metastatic disease.
Thoracic spine compression deformities are grossly stable.
2.  No evidence of extra osseous thoracic metastatic disease.
3.  New small bilateral pleural effusions with associated increased
atelectasis at both lung bases.

CT ABDOMEN AND PELVIS
FINDINGS: Hepatic steatosis has improved.  There are no focal
hepatic lesions.  The gallbladder, biliary system, pancreas and
spleen appear normal.  There is no adrenal mass.  Small renal
calculi and low density renal lesions bilaterally are unchanged.
There is no hydronephrosis.

Sigmoid colon diverticular changes are stable.  There is no
surrounding inflammation.  The stomach, small bowel and appendix
appear normal.  There are no residual enlarged abdominal pelvic
lymph nodes. A portacaval node measures 9 mm short axis on image 58
(previously 10 mm).  The previously noted enlarged left internal
iliac node has significantly decreased in size, measuring 8 mm
short axis on image 99 (previously 13 mm).  There is stable mild
aortic atherosclerosis.

The bladder appears stable.  Protuberance of prostatic tissue into
the bladder lumen is slightly less evident.  The overall size of
the prostate gland has decreased.

Widespread osseous metastatic disease is again noted.  Compared
with the prior CT, the sclerosis has progressed in extent, although
the associated compression fractures at L2 and L4 have not
significantly changed.  No significant epidural tumor is evident.
IMPRESSION: 1.  Progressive sclerosis of the widespread osseous metastatic
disease.  The associated lumbar compression deformities are grossly
unchanged.
2.  Resolution of pelvic adenopathy with decreased size of the
prostate gland and less protuberance of prostatic tissue in the
bladder lumen.  No hydronephrosis.
3.  No other evidence of extra osseous metastatic disease.
